# Patient Record
Sex: Male | Born: 1975 | Race: White | Hispanic: No | Marital: Married | Smoking: Never smoker
Health system: Southern US, Community
[De-identification: ages and names within clinical notes are randomized; demographics above are authoritative.]

## PROBLEM LIST (undated history)

## (undated) DIAGNOSIS — G43909 Migraine, unspecified, not intractable, without status migrainosus: Secondary | ICD-10-CM

## (undated) DIAGNOSIS — Z87442 Personal history of urinary calculi: Secondary | ICD-10-CM

## (undated) DIAGNOSIS — G4489 Other headache syndrome: Principal | ICD-10-CM

## (undated) DIAGNOSIS — E039 Hypothyroidism, unspecified: Secondary | ICD-10-CM

## (undated) DIAGNOSIS — G43009 Migraine without aura, not intractable, without status migrainosus: Secondary | ICD-10-CM

## (undated) DIAGNOSIS — M109 Gout, unspecified: Secondary | ICD-10-CM

## (undated) DIAGNOSIS — R0600 Dyspnea, unspecified: Secondary | ICD-10-CM

## (undated) DIAGNOSIS — Z8489 Family history of other specified conditions: Secondary | ICD-10-CM

## (undated) HISTORY — DX: Migraine, unspecified, not intractable, without status migrainosus: G43.909

## (undated) HISTORY — DX: Other headache syndrome: G44.89

## (undated) HISTORY — PX: FEMUR SURGERY: SHX943

## (undated) HISTORY — DX: Migraine without aura, not intractable, without status migrainosus: G43.009

## (undated) HISTORY — PX: SHOULDER SURGERY: SHX246

## (undated) HISTORY — PX: LEG SURGERY: SHX1003

---

## 1999-03-19 ENCOUNTER — Emergency Department (HOSPITAL_COMMUNITY): Admission: EM | Admit: 1999-03-19 | Discharge: 1999-03-19 | Payer: Self-pay | Admitting: Emergency Medicine

## 1999-03-19 ENCOUNTER — Encounter: Payer: Self-pay | Admitting: Emergency Medicine

## 1999-03-20 ENCOUNTER — Encounter: Payer: Self-pay | Admitting: Emergency Medicine

## 1999-05-02 ENCOUNTER — Emergency Department (HOSPITAL_COMMUNITY): Admission: EM | Admit: 1999-05-02 | Discharge: 1999-05-02 | Payer: Self-pay | Admitting: Emergency Medicine

## 1999-05-02 ENCOUNTER — Encounter: Payer: Self-pay | Admitting: Emergency Medicine

## 1999-06-19 ENCOUNTER — Encounter: Payer: Self-pay | Admitting: Family Medicine

## 1999-06-19 ENCOUNTER — Ambulatory Visit (HOSPITAL_COMMUNITY): Admission: RE | Admit: 1999-06-19 | Discharge: 1999-06-19 | Payer: Self-pay | Admitting: Family Medicine

## 2004-12-20 HISTORY — PX: LEG SURGERY: SHX1003

## 2005-05-14 ENCOUNTER — Ambulatory Visit (HOSPITAL_COMMUNITY): Admission: RE | Admit: 2005-05-14 | Discharge: 2005-05-14 | Payer: Self-pay | Admitting: Family Medicine

## 2005-05-14 ENCOUNTER — Emergency Department (HOSPITAL_COMMUNITY): Admission: EM | Admit: 2005-05-14 | Discharge: 2005-05-14 | Payer: Self-pay | Admitting: Family Medicine

## 2005-10-23 ENCOUNTER — Inpatient Hospital Stay (HOSPITAL_COMMUNITY): Admission: EM | Admit: 2005-10-23 | Discharge: 2005-10-28 | Payer: Self-pay | Admitting: Emergency Medicine

## 2006-03-14 ENCOUNTER — Inpatient Hospital Stay (HOSPITAL_COMMUNITY): Admission: RE | Admit: 2006-03-14 | Discharge: 2006-03-16 | Payer: Self-pay | Admitting: Orthopaedic Surgery

## 2009-02-16 ENCOUNTER — Emergency Department (HOSPITAL_COMMUNITY): Admission: EM | Admit: 2009-02-16 | Discharge: 2009-02-16 | Payer: Self-pay | Admitting: Emergency Medicine

## 2011-05-07 NOTE — Op Note (Signed)
NAME:  Thomas Burgess, Thomas Burgess NO.:  [id number]   MEDICAL RECORD NO.:  [id number]          PATIENT TYPE:  INP   LOCATION:  5038                         FACILITY:  [facility]   PHYSICIAN:  Mark C. Ophelia Charter, M.D.    DATE OF BIRTH:  1976/11/03   DATE OF PROCEDURE:  03/14/2006  DATE OF DISCHARGE:                                 OPERATIVE REPORT   PREOPERATIVE DIAGNOSIS:  Retained painful hardware left distal femur with  iliotibial band irritation and chondromalacia trochlear groove.   POSTOPERATIVE DIAGNOSIS:  Retained painful hardware left distal femur with  iliotibial band irritation and chondromalacia trochlear groove.   OPERATION PERFORMED:  Diagnostic and operative arthroscopy, left knee;  chondroplasty, trochlear groove.  Hardware removal.   SURGEON:  Mark C. Yates M.D.   ASSISTANT:   ANESTHESIA:  General.   DESCRIPTION OF PROCEDURE:  After induction of anesthesia, preoperative  Ancef, standard prep and drape with a lateral post and proximal tourniquet,  arthroscope was introduced after the usual sheets and drapes with Marcaine  infiltration of the skin with epinephrine.  Inflow was placed through a  superomedial portal.  Medial and lateral meniscus were normal.  ACL showed  some old evidence of avulsion off the femur with scar tissue in the notch  and PCL appeared normal.  Surprising there was a negative pivot shift test  and 1+ anterior drawer.  A 4.2 Cuda shaver was introduced and the area was  smoothed.  There was less than 1 mm of step off if that.  Probing showed  that there was some scar tissue present and the bone was solid with probe  not being able to enter along the probing of the fracture line.   Knee was thoroughly aspirated and suctioned dry.  Lateral incision was  opened distally.  Plate was identified and this was a titanium anatomic  distal femoral plate with one large central blue screw and the remaining  green screws which were DePuy anatomic plate.   The distal posterior screw  was stuck and had welded to the plate such that efforts to remove it were  unsuccessful.  Some of the other screws were extremely tight, difficult and  actually were beginning to strip until they finally broke loose. Great care  was taken to make sure that there was no stripping.  Once all screws had  been removed, a total of nine screws with one remaining, attempts at the  central insertion screw vise grips, large rongeur were all unsuccessful, it  was elected to proceed with osteotome underneath the plate for planning of  breaking the screw.  As it was pounded, the screw began to loosen slightly  from the bone and the plate was able to be grasped.  The incision which  initially had been one incision plus two small ones were then all connected  with a Z-shaped fashion.  Tensor fascia was completely slit and using the  plate as a handle the screw was spun out.  With all hardware removal,  irrigation, tourniquet deflation, closure of tensor fascia with number 1  Vicryl  interrupted suture, Hemovac drain was placed deep, 2-0 Vicryl in the  subcutaneous tissue, skin staple closure, nylon  in the skin, arthroscopic portals, postoperative dressing with Xeroform, 4 x  4s, ABD, Webril and Ace wrap.  Instrument count, needle count was correct.  The patient was then transferred to the recovery room in stable condition.      Mark C. Ophelia Charter, M.D.  Electronically Signed     MCY/MEDQ  D:  03/14/2006  T:  03/16/2006  Job:  [id number]
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
## 2012-01-20 ENCOUNTER — Emergency Department (HOSPITAL_COMMUNITY)
Admission: EM | Admit: 2012-01-20 | Discharge: 2012-01-20 | Disposition: A | Discharge status: Home / Self Care | Payer: Self-pay | Source: Home / Self Care | Attending: Emergency Medicine | Admitting: Emergency Medicine

## 2012-01-20 ENCOUNTER — Encounter (HOSPITAL_COMMUNITY): Payer: Self-pay | Admitting: *Deleted

## 2012-01-20 DIAGNOSIS — M109 Gout, unspecified: Secondary | ICD-10-CM

## 2012-01-20 HISTORY — DX: Hypothyroidism, unspecified: E03.9

## 2012-01-20 MED ORDER — HYDROCODONE-ACETAMINOPHEN 5-500 MG PO TABS: 1.0000 | ORAL_TABLET | Freq: Four times a day (QID) | ORAL | Status: AC | PRN

## 2012-01-20 MED ORDER — INDOMETHACIN 25 MG PO CAPS: 25.0000 mg | ORAL_CAPSULE | Freq: Three times a day (TID) | ORAL | Status: AC

## 2012-01-20 NOTE — ED Notes (Signed)
Pt is here with complaints of right foot pain with onset 1/28.

## 2012-01-21 NOTE — ED Provider Notes (Signed)
History     CSN: [id number]  Arrival date & time 01/20/12  8119   First MD Initiated Contact with Patient 01/20/12 2033      Chief Complaint  Patient presents with  . Foot Pain    (Consider location/radiation/quality/duration/timing/severity/associated sxs/prior treatment) HPI Comments: R foot been hurting for about 3 days the pian is just here ( see illustration), its hurting when i walk, its almost like a urning sensation" " No injuries and no traumas"  Patient is a 36 y.o. male presenting with lower extremity pain. The history is provided by the patient.  Foot Pain This is a new problem. The current episode started 2 days ago. The problem occurs constantly. The problem has been gradually worsening. The symptoms are aggravated by walking.    Past Medical History  Diagnosis Date  . Hypothyroid     Past Surgical History  Procedure Date  . Femur surgery     History reviewed. No pertinent family history.  History  Substance Use Topics  . Smoking status: Never Smoker   . Smokeless tobacco: Not on file  . Alcohol Use: No      Review of Systems  Constitutional: Negative for fever and diaphoresis.  Musculoskeletal: Positive for joint swelling. Negative for myalgias.    Allergies  Review of patient's allergies indicates no known allergies.  Home Medications   Current Outpatient Rx  Name Route Sig Dispense Refill  . FLUOXETINE HCL 10 MG PO CAPS Oral Take 10 mg by mouth daily.    Marland Kitchen LEVOTHYROXINE SODIUM 100 MCG PO TABS Oral Take 100 mcg by mouth daily.    Marland Kitchen HYDROCODONE-ACETAMINOPHEN 5-500 MG PO TABS Oral Take 1-2 tablets by mouth every 6 (six) hours as needed for pain. 15 tablet 0  . INDOMETHACIN 25 MG PO CAPS Oral Take 1 capsule (25 mg total) by mouth 3 (three) times daily with meals. 45 capsule 0    BP 151/88  Pulse 90  Temp(Src) 98.8 F (37.1 C) (Oral)  Resp 16  SpO2 99%  Physical Exam  Constitutional: He appears well-developed and well-nourished. He  appears distressed.  Musculoskeletal: He exhibits tenderness.       Right ankle: Achilles tendon normal.       Feet:  Skin: Skin is warm. No rash noted. No erythema.    ED Course  Procedures (including critical care time)  Labs Reviewed - No data to display No results found.   1. Podagra       MDM  R first MTP joint, pain suspected gout        Jimmie Molly, MD 01/21/12 [phone]

## 2013-03-25 ENCOUNTER — Encounter (HOSPITAL_COMMUNITY): Payer: Self-pay | Admitting: Emergency Medicine

## 2013-03-25 ENCOUNTER — Emergency Department (INDEPENDENT_AMBULATORY_CARE_PROVIDER_SITE_OTHER): Payer: 59

## 2013-03-25 ENCOUNTER — Emergency Department (HOSPITAL_COMMUNITY)
Admission: EM | Admit: 2013-03-25 | Discharge: 2013-03-25 | Disposition: A | Discharge status: Home / Self Care | Payer: 59 | Source: Home / Self Care | Attending: Emergency Medicine | Admitting: Emergency Medicine

## 2013-03-25 DIAGNOSIS — L309 Dermatitis, unspecified: Secondary | ICD-10-CM

## 2013-03-25 DIAGNOSIS — Z23 Encounter for immunization: Secondary | ICD-10-CM

## 2013-03-25 DIAGNOSIS — S62102S Fracture of unspecified carpal bone, left wrist, sequela: Secondary | ICD-10-CM

## 2013-03-25 DIAGNOSIS — S42309S Unspecified fracture of shaft of humerus, unspecified arm, sequela: Secondary | ICD-10-CM

## 2013-03-25 DIAGNOSIS — L259 Unspecified contact dermatitis, unspecified cause: Secondary | ICD-10-CM

## 2013-03-25 MED ORDER — HYDROCODONE-ACETAMINOPHEN 5-325 MG PO TABS: 2.0000 | ORAL_TABLET | ORAL | Status: DC | PRN

## 2013-03-25 MED ORDER — PREDNISONE 10 MG PO TABS: 20.0000 mg | ORAL_TABLET | Freq: Every day | ORAL | Status: DC

## 2013-03-25 MED ORDER — TETANUS-DIPHTH-ACELL PERTUSSIS 5-2.5-18.5 LF-MCG/0.5 IM SUSP
0.5000 mL | Freq: Once | INTRAMUSCULAR | Status: AC
  Administered 2013-03-25: 0.5 mL via INTRAMUSCULAR

## 2013-03-25 MED ORDER — TETANUS-DIPHTH-ACELL PERTUSSIS 5-2.5-18.5 LF-MCG/0.5 IM SUSP
INTRAMUSCULAR | Status: AC
  Filled 2013-03-25: qty 0.5

## 2013-03-25 NOTE — ED Notes (Signed)
Multiple complaints: Patient reports a fall yesterday 03/24/2013.  Patient slipped on deck steps ( 4 steps) and landed on concrete sidewalk.  Pain in lateral left wrist, scrapes to fingers.   Patient also concerned about rash to torso, thighs.  Patient thought to be poison ivy, itches badly.

## 2013-03-25 NOTE — ED Provider Notes (Signed)
History     CSN: [id number]  Arrival date & time 03/25/13  1120   First MD Initiated Contact with Patient 03/25/13 1136      No chief complaint on file.   (Consider location/radiation/quality/duration/timing/severity/associated sxs/prior treatment) Patient is a 37 y.o. male presenting with wrist pain. The history is provided by the patient. No language interpreter was used.  Wrist Pain This is a new problem. The current episode started yesterday. The problem occurs constantly. The problem has not changed since onset.Nothing relieves the symptoms.  Pt reports he slipped and fell yesterday.   Pt complains of pain in his wrist.   Past Medical History  Diagnosis Date  . Hypothyroid     Past Surgical History  Procedure Laterality Date  . Femur surgery      No family history on file.  History  Substance Use Topics  . Smoking status: Never Smoker   . Smokeless tobacco: Not on file  . Alcohol Use: No      Review of Systems  Musculoskeletal: Positive for myalgias and joint swelling.  Skin: Positive for rash.  All other systems reviewed and are negative.    Allergies  Review of patient's allergies indicates no known allergies.  Home Medications   Current Outpatient Rx  Name  Route  Sig  Dispense  Refill  . FLUoxetine (PROZAC) 10 MG capsule   Oral   Take 10 mg by mouth daily.         Marland Kitchen levothyroxine (SYNTHROID, LEVOTHROID) 100 MCG tablet   Oral   Take 100 mcg by mouth daily.           BP 114/73  Pulse 80  Temp(Src) 98.1 F (36.7 C) (Oral)  Resp 16  SpO2 100%  Physical Exam  Nursing note and vitals reviewed. Constitutional: He is oriented to person, place, and time. He appears well-developed.  Musculoskeletal: He exhibits tenderness.  Tender swollen left wrist abrasions knuckles  Neurological: He is alert and oriented to person, place, and time. He has normal reflexes.  Skin: Rash noted.  Psychiatric: He has a normal mood and affect.    ED Course   Procedures (including critical care time)  Labs Reviewed - No data to display No results found.   No diagnosis found.    MDM  Wrist splint,   Pt advised to see Dr. Janee Morn for recheck next week.        Lonia Skinner Wye, PA-C 03/25/13 1425

## 2013-03-25 NOTE — ED Provider Notes (Signed)
Medical screening examination/treatment/procedure(s) were performed by non-physician practitioner and as supervising physician I was immediately available for consultation/collaboration.  Quanna Wittke, M.D.  Cay Kath C Kendre Jacinto, MD 03/25/13 1541 

## 2013-03-25 NOTE — ED Notes (Signed)
Patient and family escorted to waiting room after x-ray to wait for results.

## 2014-06-04 ENCOUNTER — Ambulatory Visit (INDEPENDENT_AMBULATORY_CARE_PROVIDER_SITE_OTHER): Payer: Self-pay | Admitting: Family Medicine

## 2014-06-04 ENCOUNTER — Ambulatory Visit (INDEPENDENT_AMBULATORY_CARE_PROVIDER_SITE_OTHER): Payer: Commercial Managed Care - PPO | Admitting: Family Medicine

## 2014-06-04 ENCOUNTER — Encounter: Payer: Self-pay | Admitting: Family Medicine

## 2014-06-04 VITALS — BP 130/82 | HR 90 | Temp 97.6°F | Resp 16 | Ht 71.0 in | Wt 225.8 lb

## 2014-06-04 VITALS — BP 130/82 | HR 90 | Temp 97.9°F | Resp 16 | Ht 71.0 in | Wt 225.8 lb

## 2014-06-04 DIAGNOSIS — Z024 Encounter for examination for driving license: Secondary | ICD-10-CM

## 2014-06-04 DIAGNOSIS — Z0289 Encounter for other administrative examinations: Secondary | ICD-10-CM

## 2014-06-04 DIAGNOSIS — L03211 Cellulitis of face: Secondary | ICD-10-CM

## 2014-06-04 DIAGNOSIS — L0201 Cutaneous abscess of face: Secondary | ICD-10-CM

## 2014-06-04 MED ORDER — SULFAMETHOXAZOLE-TMP DS 800-160 MG PO TABS: 1.0000 | ORAL_TABLET | Freq: Two times a day (BID) | ORAL | Status: DC
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
## 2014-06-04 NOTE — Progress Notes (Signed)
Subjective: Patient has had a cyst ingrown hair type lesion on his left chin for about a week. He tried to open up a couple times he just got blood. It is now starting to calm down but he wanted it checked.  Objective: Cystic abscess about 1 cm in diameter on the left side of his chin. Has a little scab on it. Uncertain whether there is a hair then grew. It is not actively inflamed right now, seems to be starting to heal with a little skin starting to flake.  Assessment: Resolving abscess left shin  Plan: Bactrim twice daily for one week Because this looks like it is resolving I decided it was not necessary to I&D it.

## 2014-06-04 NOTE — Patient Instructions (Signed)
Take Bactrim one twice daily  If the abscess gets more acute please return at any time. Followup with your regular doctor as planned.  It is important that you followup on your thyroid as planned.

## 2014-12-23 ENCOUNTER — Other Ambulatory Visit: Payer: Self-pay | Admitting: Orthopaedic Surgery

## 2014-12-23 DIAGNOSIS — M25562 Pain in left knee: Secondary | ICD-10-CM

## 2014-12-29 ENCOUNTER — Ambulatory Visit
Admission: RE | Admit: 2014-12-29 | Discharge: 2014-12-29 | Disposition: A | Discharge status: Home / Self Care | Payer: Self-pay | Source: Ambulatory Visit | Attending: Orthopaedic Surgery | Admitting: Orthopaedic Surgery

## 2014-12-29 DIAGNOSIS — M25562 Pain in left knee: Secondary | ICD-10-CM

## 2014-12-30 ENCOUNTER — Other Ambulatory Visit: Payer: Self-pay

## 2017-04-01 ENCOUNTER — Ambulatory Visit (INDEPENDENT_AMBULATORY_CARE_PROVIDER_SITE_OTHER): Payer: Self-pay

## 2017-04-01 ENCOUNTER — Ambulatory Visit (INDEPENDENT_AMBULATORY_CARE_PROVIDER_SITE_OTHER): Payer: Commercial Managed Care - PPO | Admitting: Orthopaedic Surgery

## 2017-04-01 ENCOUNTER — Ambulatory Visit (INDEPENDENT_AMBULATORY_CARE_PROVIDER_SITE_OTHER): Payer: BLUE CROSS/BLUE SHIELD | Admitting: Orthopaedic Surgery

## 2017-04-01 DIAGNOSIS — G8929 Other chronic pain: Secondary | ICD-10-CM

## 2017-04-01 DIAGNOSIS — M25561 Pain in right knee: Secondary | ICD-10-CM | POA: Diagnosis not present

## 2017-04-01 DIAGNOSIS — M79672 Pain in left foot: Secondary | ICD-10-CM

## 2017-04-01 DIAGNOSIS — M25562 Pain in left knee: Secondary | ICD-10-CM | POA: Diagnosis not present

## 2017-04-01 MED ORDER — MELOXICAM 7.5 MG PO TABS
7.5000 mg | ORAL_TABLET | Freq: Two times a day (BID) | ORAL | 2 refills | Status: DC | PRN
Start: 2017-04-01 — End: 2017-09-19

## 2017-04-01 NOTE — Progress Notes (Signed)
Office Visit Note   Patient: Thomas Burgess           Date of Birth: 06-06-1976           MRN: [id number] Visit Date: 04/01/2017              Requested by: Kaleen Mask, MD [address] [place], [place] [address] PCP: Kaleen Mask, MD   Assessment & Plan: Visit Diagnoses:  1. Chronic pain of both knees   2. Pain in left foot     Plan: For the left knee he has moderate osteoarthritis. His x-rays show joint space narrowing already and he had MRI 2 years ago that showed chondromalacia. Monovisc application submitted for the left knee. For the right leg I think is overused it with his anterior compartment muscles. The herniation is postsurgical. Her left foot I recommend Achilles stretching and supportive shoes. I'll see him back for the Monovisc injection.  Follow-Up Instructions: Return if symptoms worsen or fail to improve.   Orders:  Orders Placed This Encounter  Procedures  . XR KNEE 3 VIEW LEFT  . XR KNEE 3 VIEW RIGHT  . XR Foot Complete Left   Meds ordered this encounter  Medications  . meloxicam (MOBIC) 7.5 MG tablet    Sig: Take 1 tablet (7.5 mg total) by mouth 2 (two) times daily as needed for pain.    Dispense:  30 tablet    Refill:  2      Procedures: No procedures performed   Clinical Data: No additional findings.   Subjective: Chief Complaint  Patient presents with  . Left Knee - Pain    Patient is a 41 year old gentleman I saw 2 years ago for left knee chondromalacia which we got an MRI for comes in with chronic throbbing aching pain of the left knee that's worse with activity. He is also complaining of some right anterior shin pain. He is status post ORIF right tibial plateau fracture. He is also complaining of some mid arch pain is worse with activity.    Review of Systems  Constitutional: Negative.   All other systems reviewed and are negative.    Objective: Vital Signs: There were no vitals taken for this  visit.  Physical Exam  Constitutional: He is oriented to person, place, and time. He appears well-developed and well-nourished.  Pulmonary/Chest: Effort normal.  Abdominal: Soft.  Neurological: He is alert and oriented to person, place, and time.  Skin: Skin is warm.  Psychiatric: He has a normal mood and affect. His behavior is normal. Judgment and thought content normal.  Nursing note and vitals reviewed.   Ortho Exam Left knee exam shows no joint effusion and excellent range of motion closed and cruciates are stable no medial or lateral joint line tenderness. Mild patellar crepitus Right knee exam shows well-healed surgical scar. There is a muscular herniation of the anterior compartment likely due to the surgical approach. Left foot exam shows no swelling or skin changes he has discomfort in the mid arch of the plantar fascia. Specialty Comments:  No specialty comments available.  Imaging: No results found.   PMFS History: There are no active problems to display for this patient.  Past Medical History:  Diagnosis Date  . Hypothyroid     No family history on file.  Past Surgical History:  Procedure Laterality Date  . FEMUR SURGERY     Social History   Occupational History  . Not on file.   Social  History Main Topics  . Smoking status: Never Smoker  . Smokeless tobacco: Not on file  . Alcohol use No  . Drug use: No  . Sexual activity: Not on file

## 2017-04-13 ENCOUNTER — Telehealth (INDEPENDENT_AMBULATORY_CARE_PROVIDER_SITE_OTHER): Payer: Self-pay | Admitting: Orthopaedic Surgery

## 2017-04-13 NOTE — Telephone Encounter (Signed)
Candy called from [employer] and was needing a pre authorization for the patient to receive the injection. CB # [phone]

## 2017-04-14 NOTE — Telephone Encounter (Signed)
Called no answer LMOM to return my call

## 2017-04-19 ENCOUNTER — Telehealth (INDEPENDENT_AMBULATORY_CARE_PROVIDER_SITE_OTHER): Payer: Self-pay

## 2017-05-31 ENCOUNTER — Telehealth (INDEPENDENT_AMBULATORY_CARE_PROVIDER_SITE_OTHER): Payer: Self-pay

## 2017-05-31 NOTE — Telephone Encounter (Signed)
SYNVISC INJECTION WAS APPROVED BY [employer].   CALLED PT TO [facility] AND HE SAID HE WILL CALL US BACK TO SCHEDULE APPT SINCE HE WILL BE GOING ON VACATION NEXT WEEK, INJ IS IN MY CABINET.

## 2017-09-19 ENCOUNTER — Other Ambulatory Visit: Payer: Self-pay | Admitting: Family Medicine

## 2017-09-19 ENCOUNTER — Encounter (INDEPENDENT_AMBULATORY_CARE_PROVIDER_SITE_OTHER): Payer: Self-pay | Admitting: Orthopaedic Surgery

## 2017-09-19 ENCOUNTER — Ambulatory Visit (INDEPENDENT_AMBULATORY_CARE_PROVIDER_SITE_OTHER): Payer: BLUE CROSS/BLUE SHIELD | Admitting: Orthopaedic Surgery

## 2017-09-19 DIAGNOSIS — R519 Headache, unspecified: Secondary | ICD-10-CM

## 2017-09-19 DIAGNOSIS — R51 Headache: Principal | ICD-10-CM

## 2017-09-19 DIAGNOSIS — M1712 Unilateral primary osteoarthritis, left knee: Secondary | ICD-10-CM | POA: Diagnosis not present

## 2017-09-19 MED ORDER — MELOXICAM 7.5 MG PO TABS: 7.5000 mg | ORAL_TABLET | Freq: Two times a day (BID) | ORAL | 6 refills | Status: DC | PRN

## 2017-09-19 MED ORDER — HYLAN G-F 20 48 MG/6ML IX SOSY
48.0000 mg | PREFILLED_SYRINGE | INTRA_ARTICULAR | Status: AC | PRN
  Administered 2017-09-19: 48 mg via INTRA_ARTICULAR

## 2017-09-19 NOTE — Progress Notes (Signed)
   Procedure Note  Patient: Thomas Burgess             Date of Birth: 1976/03/17           MRN: [id number]             Visit Date: 09/19/2017  Procedures: Visit Diagnoses: Unilateral primary osteoarthritis, left knee  Large Joint Inj Date/Time: 09/19/2017 9:21 AM Performed by: Tarry Kos Authorized by: Tarry Kos   Consent Given by:  Patient Timeout: prior to procedure the correct patient, procedure, and site was verified   Indications:  Pain Location:  Knee Site:  L knee Prep: patient was prepped and draped in usual sterile fashion   Needle Size:  22 G Approach:  Anterolateral Ultrasound Guidance: No   Fluoroscopic Guidance: No   Arthrogram: No   Medications:  48 mg Hylan 48 MG/6ML

## 2017-09-25 ENCOUNTER — Ambulatory Visit
Admission: RE | Admit: 2017-09-25 | Discharge: 2017-09-25 | Disposition: A | Discharge status: Home / Self Care | Payer: BLUE CROSS/BLUE SHIELD | Source: Ambulatory Visit | Attending: Family Medicine | Admitting: Family Medicine

## 2017-09-25 DIAGNOSIS — R51 Headache: Principal | ICD-10-CM

## 2017-09-25 DIAGNOSIS — R519 Headache, unspecified: Secondary | ICD-10-CM

## 2017-09-28 ENCOUNTER — Encounter: Payer: Self-pay | Admitting: Neurology

## 2017-09-28 ENCOUNTER — Ambulatory Visit (INDEPENDENT_AMBULATORY_CARE_PROVIDER_SITE_OTHER): Payer: BLUE CROSS/BLUE SHIELD | Admitting: Neurology

## 2017-09-28 DIAGNOSIS — G4489 Other headache syndrome: Secondary | ICD-10-CM | POA: Diagnosis not present

## 2017-09-28 DIAGNOSIS — G43909 Migraine, unspecified, not intractable, without status migrainosus: Secondary | ICD-10-CM | POA: Insufficient documentation

## 2017-09-28 DIAGNOSIS — G43009 Migraine without aura, not intractable, without status migrainosus: Secondary | ICD-10-CM | POA: Diagnosis not present

## 2017-09-28 HISTORY — DX: Migraine without aura, not intractable, without status migrainosus: G43.009

## 2017-09-28 HISTORY — DX: Other headache syndrome: G44.89

## 2017-09-28 MED ORDER — PREDNISONE 10 MG PO TABS: ORAL_TABLET | ORAL | 0 refills | Status: DC

## 2017-09-28 MED ORDER — TOPIRAMATE 25 MG PO TABS: ORAL_TABLET | ORAL | 3 refills | Status: DC

## 2017-09-28 NOTE — Progress Notes (Signed)
Reason for visit: Headache  Referring physician: Dr. Latanya Maudlin is a 41 y.o. male  History of present illness:  Mr. Maack is a 41 year old right-handed white male with a history of migraine headaches that have been present since he was 89 or 41 years old. His usual migraine are in the frontotemporal regions associated with photophobia and phonophobia, and with nausea without vomiting. The patient is having one headache such as this every 2 months or so, the headaches are severe and are helped by trying to get to sleep. The patient has developed another type of headache that began about one month ago. The patient indicates a spontaneous onset of right occipital pain that is fairly well localized. It has been associated with some cognitive clouding, no photophobia or phonophobia or nausea has been noted. He also feels he is slightly off balance, and somewhat dizzy. He has not had any falls. He denies any pain down into the shoulders, he denies any significant neck stiffness or neck pain. He denies any issues controlling the bowels or the bladder. He has not had any numbness or weakness of the arms or legs or face. He has undergone MRI evaluation of the brain that was normal. He is sent to this office for an evaluation. His wife has migraine, he has taken Maxalt for his headache without benefit. The headache is present every day, all day long.  Past Medical History:  Diagnosis Date  . Hypothyroid   . Migraine     Past Surgical History:  Procedure Laterality Date  . FEMUR SURGERY    . 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 LEG SURGERY     Removed hardware    Family History  Problem Relation Age of Onset  . Congestive Heart Failure Mother   . Diabetes Mother   . Kidney failure Father   . Brain cancer Cousin     Social history:  reports that he has never smoked. He has never used smokeless tobacco. He reports that he does not drink alcohol or use  drugs.  Medications:  Prior to Admission medications   Medication Sig Start Date End Date Taking? Authorizing Provider  levothyroxine (SYNTHROID, LEVOTHROID) 100 MCG tablet Take 100 mcg by mouth daily.   Yes [provider]  meloxicam (MOBIC) 7.5 MG tablet Take 1 tablet (7.5 mg total) by mouth 2 (two) times daily as needed for pain. 09/19/17  Yes Tarry Kos, MD  sulfamethoxazole-trimethoprim (BACTRIM DS) 800-160 MG per tablet Take 1 tablet by mouth 2 (two) times daily. 06/04/14  Yes Peyton Najjar, MD     No Known Allergies  ROS:  Out of a complete 14 system review of symptoms, the patient complains only of the following symptoms, and all other reviewed systems are negative.  Fatigue Blurred vision Joint pain Memory loss, confusion, headache, dizziness Depression, decreased energy, disinterest in activities  Blood pressure 138/88, pulse 65, height  (1.88 m), weight 227 lb (103 kg), SpO2 97 %.  Physical Exam  General: The patient is alert and cooperative at the time of the examination.  Eyes: Pupils are equal, round, and reactive to light. Discs are flat bilaterally.  Neck: The neck is supple, no carotid bruits are noted.  Respiratory: The respiratory examination is clear.  Cardiovascular: The cardiovascular examination reveals a regular rate and rhythm, no obvious murmurs or rubs are noted.  Neuromuscular: Range of movement of the cervical spine is full.  Skin: Extremities are without significant edema.  Neurologic Exam  Mental status: The patient is alert and oriented x 3 at the time of the examination. The patient has apparent normal recent and remote memory, with an apparently normal attention span and concentration ability.  Cranial nerves: Facial symmetry is present. There is good sensation of the face to pinprick and soft touch bilaterally. The strength of the facial muscles and the muscles to head turning and shoulder shrug are normal bilaterally.  Speech is well enunciated, no aphasia or dysarthria is noted. Extraocular movements are full. Visual fields are full. The tongue is midline, and the patient has symmetric elevation of the soft palate. No obvious hearing deficits are noted.  Motor: The motor testing reveals 5 over 5 strength of all 4 extremities. Good symmetric motor tone is noted throughout.  Sensory: Sensory testing is intact to pinprick, soft touch, vibration sensation, and position sense on the left extremities. The patient has some mild impairment of pinprick, vibration sensation, and position sensation on the right arm and right leg. No evidence of extinction is noted.  Coordination: Cerebellar testing reveals good finger-nose-finger and heel-to-shin bilaterally.  Gait and station: Gait is normal. Tandem gait is normal. Romberg is negative. No drift is seen.  Reflexes: Deep tendon reflexes are symmetric and normal bilaterally. Toes are downgoing bilaterally.   MRI brain 09/25/17:  IMPRESSION: Normal examination. No abnormality seen to explain the presenting Symptoms.  * MRI scan images were reviewed online. I agree with the written report.    Assessment/Plan:  1. History of common migraine headache  2. Right occipital headache, new onset  The patient has had a new type of headache that does not feel like his typical migraine. The headache is in the right occipital area, well localized, and appears to be associated with some mild cognitive changes and gait instability. MRI of the brain appears to be normal. The patient will be placed on a prednisone Dosepak, 10 mg 12 day pack. He will be started on Topamax. If the headache does not abate in the next couple weeks, he will contact our office, we may consider an occipital nerve injection on the right. If the headache continues, MRI of the cervical spine will be done. The patient will follow-up in 2-3 months otherwise.  Marlan Palau MD 09/28/2017 9:07 AM  [facility] [address] [place], [place] [address]  Phone [phone] Fax [phone]

## 2017-09-28 NOTE — Patient Instructions (Signed)
   We will initiate a Prednisone taper, and start Topamax for the headache.  If the headache does not get better, call and I will get MRI of the neck.   Topamax (topiramate) is a seizure medication that has an FDA approval for seizures and for migraine headache. Potential side effects of this medication include weight loss, cognitive slowing, tingling in the fingers and toes, and carbonated drinks will taste bad. If any significant side effects are noted on this drug, please contact our office.

## 2017-10-01 ENCOUNTER — Other Ambulatory Visit: Payer: Self-pay

## 2017-10-12 ENCOUNTER — Telehealth: Payer: Self-pay | Admitting: Neurology

## 2017-10-12 NOTE — Telephone Encounter (Signed)
Called pt. LVM. Offered today at 230pm, check in 200pm. Asked him to call back and let me know if this works. Gave [facility] phone number.

## 2017-10-12 NOTE — Telephone Encounter (Signed)
Note made in error

## 2017-10-12 NOTE — Telephone Encounter (Signed)
Called pt again. LVM letting him know 230pm no longer available. Asked him to call back to schedule appt.

## 2017-10-12 NOTE — Telephone Encounter (Signed)
This is a continuation of previous entry, the following dates were offered to pt: 10-26 @12 , today @ 2:30,10-30@7 :30, and 10-31@12 .  Pt was unable to accept any of these dates and times offered by RN Kara MeadEmma.

## 2017-10-12 NOTE — Telephone Encounter (Signed)
Pt called back, said he could as off work in a couple of weeks. 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 Please call to advise.

## 2017-10-12 NOTE — Telephone Encounter (Signed)
Pt returned the call to RN Kara MeadEmma, RN Kara MeadEmma offered several dates and times to pt who unfortunately could not accept any of them due to a conflict with his work schedule.  Pt asked that it be documented that he is still having head aches and that he will try back should he see potential times that he will be available with his work schedule.  He is thankful for RN Kara Meadmma attempting to schedule him and he apologized for missing her call.

## 2017-10-12 NOTE — Telephone Encounter (Addendum)
230 pm no longer available. When he calls, we will have to offer a different work in slot, [address]. Can offer Friday (10/14/17) at 12pm, check in 1130am

## 2017-10-12 NOTE — Telephone Encounter (Signed)
I called the patient. His headache is still going on, we will have him come in for an occipital nerve injection to see this helps.

## 2017-10-12 NOTE — Telephone Encounter (Signed)
Patient's wife calling to schedule a soon appointment. His headache has not stopped since he was saw Dr. Anne HahnWillis on 09-28-17. He is taking topiramate (TOPAMAX) 25 MG tablet and it has not helped.

## 2017-10-13 NOTE — Telephone Encounter (Signed)
Patient called and stated that he has [facility] additional [facility]. Please call and advise.

## 2017-10-13 NOTE — Telephone Encounter (Signed)
Called pt back. He stated 10/31/17 did not work d/t work schedule. He requested appt on 11/26. Made appt that day at 0730 per pt request. He verbalized understanding and appreciation.

## 2017-10-13 NOTE — Telephone Encounter (Signed)
Called pt. Offered work in appt for 10/28/17 at 12pm. He declined, stating he needs a Monday appt.  Scheduled pt for 10/31/17 at 730am, check in latest 715am. He verbalized understanding and appreciation for call.

## 2017-10-31 ENCOUNTER — Ambulatory Visit: Payer: Self-pay | Admitting: Neurology

## 2017-11-14 ENCOUNTER — Encounter: Payer: Self-pay | Admitting: Neurology

## 2017-11-14 ENCOUNTER — Other Ambulatory Visit: Payer: Self-pay | Admitting: *Deleted

## 2017-11-14 ENCOUNTER — Telehealth: Payer: Self-pay | Admitting: *Deleted

## 2017-11-14 ENCOUNTER — Telehealth: Payer: Self-pay

## 2017-11-14 ENCOUNTER — Ambulatory Visit (INDEPENDENT_AMBULATORY_CARE_PROVIDER_SITE_OTHER): Payer: BLUE CROSS/BLUE SHIELD | Admitting: Neurology

## 2017-11-14 VITALS — BP 146/97 | HR 91 | Ht 74.0 in | Wt 226.0 lb

## 2017-11-14 DIAGNOSIS — G43009 Migraine without aura, not intractable, without status migrainosus: Secondary | ICD-10-CM | POA: Diagnosis not present

## 2017-11-14 DIAGNOSIS — G4489 Other headache syndrome: Secondary | ICD-10-CM

## 2017-11-14 MED ORDER — TOPIRAMATE 100 MG PO TABS: 100.0000 mg | ORAL_TABLET | Freq: Two times a day (BID) | ORAL | 1 refills | Status: DC

## 2017-11-14 MED ORDER — TOPIRAMATE 100 MG PO TABS: 100.0000 mg | ORAL_TABLET | Freq: Every day | ORAL | 1 refills | Status: DC

## 2017-11-14 MED ORDER — RIZATRIPTAN BENZOATE 10 MG PO TABS: 10.0000 mg | ORAL_TABLET | Freq: Three times a day (TID) | ORAL | 5 refills | Status: DC | PRN

## 2017-11-14 NOTE — Patient Instructions (Signed)
   We will go up on the Topamax to 100 mg at night. Use the Maxalt 10 mg tablet to take if needed, do not take more than 3 Maxalt tablets in a 24 hour period.  Topamax (topiramate) is a seizure medication that has an FDA approval for seizures and for migraine headache. Potential side effects of this medication include weight loss, cognitive slowing, tingling in the fingers and toes, and carbonated drinks will taste bad. If any significant side effects are noted on this drug, please contact our office.

## 2017-11-14 NOTE — Telephone Encounter (Signed)
Called [facility] and cancelled original rx topamax for 100mg  tablet 1 tab twice daily qty90, 1 refill. This was an error. They cx rx as requested.   E-scribed topamax 100mg  tablet (1 tablet at bedtime) per CW,MD. Qty 90, 1 refill instead.

## 2017-11-14 NOTE — Telephone Encounter (Signed)
I received a prior auth request for Topamax. I have completed and submitted the PA and should have a determination within 48-72 hours.  Cover my meds Key: [id number]

## 2017-11-14 NOTE — Progress Notes (Signed)
Reason for visit: Headache  Thomas Burgess is an 41 y.o. male  History of present illness:  Thomas Burgess is a 41 year old right-handed white male with a history of headaches.  The patient has recently developed a right occipital headache that is different from his usual.  Headache may be associated with some cognitive clouding, sleepiness.  He denies any photophobia or phonophobia with the headache.  The patient was originally given a prednisone Dosepak and placed on Topamax, he has built up on the dose and appears to be effective in helping to reduce the frequency of the headache.  He is now having headaches once a week, the headache may last all day long, he is still able to work.  The patient denies any neck stiffness.  The patient is tolerating the Topamax well, he does note that carbonated drinks taste bad.  The patient returns to this office for an evaluation.  Past Medical History:  Diagnosis Date  . Common migraine 09/28/2017  . Headache syndrome 09/28/2017   Right occipital  . Hypothyroid   . Migraine     Past Surgical History:  Procedure Laterality Date  . FEMUR SURGERY    . 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 LEG SURGERY     Removed hardware    Family History  Problem Relation Age of Onset  . Congestive Heart Failure Mother   . Diabetes Mother   . Kidney failure Father   . Brain cancer Cousin     Social history:  reports that  has never smoked. he has never used smokeless tobacco. He reports that he does not drink alcohol or use drugs.   No Known Allergies  Medications:  Prior to Admission medications   Medication Sig Start Date End Date Taking? Authorizing Provider  levothyroxine (SYNTHROID, LEVOTHROID) 100 MCG tablet Take 100 mcg by mouth daily.   Yes [provider]  meloxicam (MOBIC) 7.5 MG tablet Take 1 tablet (7.5 mg total) by mouth 2 (two) times daily as needed for pain. 09/19/17  Yes Tarry KosXu, Naiping M, MD  sulfamethoxazole-trimethoprim  (BACTRIM DS) 800-160 MG per tablet Take 1 tablet by mouth 2 (two) times daily. 06/04/14  Yes Peyton NajjarHopper, David H, MD  topiramate (TOPAMAX) 25 MG tablet Take one tablet at night for one week, then take 2 tablets at night for one week, then take 3 tablets at night. 09/28/17  Yes York SpanielWillis, Philopateer Strine K, MD    ROS:  Out of a complete 14 system review of symptoms, the patient complains only of the following symptoms, and all other reviewed systems are negative.  Headache  Blood pressure (!) 146/97, pulse 91, height 6\' 2"  (1.88 m), weight 226 lb (102.5 kg).  Physical Exam  General: The patient is alert and cooperative at the time of the examination.  Neuromuscular: Range of movement the cervical spine is full.  Skin: No significant peripheral edema is noted.   Neurologic Exam  Mental status: The patient is alert and oriented x 3 at the time of the examination. The patient has apparent normal recent and remote memory, with an apparently normal attention span and concentration ability.   Cranial nerves: Facial symmetry is present. Speech is normal, no aphasia or dysarthria is noted. Extraocular movements are full. Visual fields are full.  Motor: The patient has good strength in all 4 extremities.  Sensory examination: Soft touch sensation is symmetric on the face, arms, and legs.  Coordination: The patient has good finger-nose-finger  and heel-to-shin bilaterally.  Gait and station: The patient has a normal gait. Tandem gait is normal. Romberg is negative. No drift is seen.  Reflexes: Deep tendon reflexes are symmetric.   Assessment/Plan:  1.  Right occipital headache, probable migraine  The patient is responding well to the Topamax, we will go up to 100 mg at night.  The patient was given Maxalt to take if needed.  The patient recently was worked in for an occipital nerve injection, but this was not done today as the headache has improved.  The patient will follow-up for his next scheduled  appointment on 24 January 2018.  Marlan Palau. Keith Alexavier Tsutsui MD 11/14/2017 7:26 AM  [facility] [address] [place], [place] [id number]  Phone [phone] Fax [phone]

## 2018-01-24 ENCOUNTER — Ambulatory Visit: Payer: BLUE CROSS/BLUE SHIELD | Admitting: Adult Health

## 2018-02-12 ENCOUNTER — Encounter (HOSPITAL_COMMUNITY): Payer: Self-pay | Admitting: Emergency Medicine

## 2018-02-12 ENCOUNTER — Other Ambulatory Visit: Payer: Self-pay

## 2018-02-12 ENCOUNTER — Ambulatory Visit (HOSPITAL_COMMUNITY)
Admission: EM | Admit: 2018-02-12 | Discharge: 2018-02-12 | Disposition: A | Discharge status: Home / Self Care | Payer: BLUE CROSS/BLUE SHIELD | Attending: Family Medicine | Admitting: Family Medicine

## 2018-02-12 DIAGNOSIS — M25522 Pain in left elbow: Secondary | ICD-10-CM

## 2018-02-12 HISTORY — DX: Gout, unspecified: M10.9

## 2018-02-12 MED ORDER — NAPROXEN 500 MG PO TABS: 500.0000 mg | ORAL_TABLET | Freq: Two times a day (BID) | ORAL | 0 refills | Status: DC

## 2018-02-12 NOTE — Discharge Instructions (Signed)
Ice and elevation Naproxen twice a day for pain. Protect elbow from direct trauma or injury to allow for healing. If no improvement, fevers develop, worsening of pain, numbness or tingling to arm or hand or otherwise worsening please return to be seen or follow up with your orthopedist.

## 2018-02-12 NOTE — ED Provider Notes (Signed)
[facility]    CSN: [id number] Arrival date & time: 02/12/18  1204     History   Chief Complaint Chief Complaint  Patient presents with  . Elbow Pain    left    HPI Thomas Burgess is a 42 y.o. male.   Thomas Burgess presents with s/o with complaints of left elbow pain which he woke with yesterday morning. Without injury. He is active with his work and does lifting and pulling, but without any specific over use or injury. Yesterday was stiff and more painful, today pain has improved. Pain 3/10. He is right handed. States he has had gout in the past but this does not feel similar. Pain is primarily if olecranon is touched or with rotation of the elbow. Without fevers, numbness or tingling. Has not taken any medications for his pain. History of hypothyroid, migraines, gout.     ROS per HPI.       Past Medical History:  Diagnosis Date  . Common migraine 09/28/2017  . Gout   . Headache syndrome 09/28/2017   Right occipital  . Hypothyroid   . Migraine     Patient Active Problem List   Diagnosis Date Noted  . Headache syndrome 09/28/2017  . Common migraine 09/28/2017  . Unilateral primary osteoarthritis, left knee 09/19/2017    Past Surgical History:  Procedure Laterality Date  . FEMUR SURGERY    . 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 LEG SURGERY     Removed hardware       Home Medications    Prior to Admission medications   Medication Sig Start Date End Date Taking? Authorizing Provider  levothyroxine (SYNTHROID, LEVOTHROID) 100 MCG tablet Take 100 mcg by mouth daily.   Yes [provider]  rizatriptan (MAXALT) 10 MG tablet Take 1 tablet (10 mg total) by mouth 3 (three) times daily as needed for migraine. May repeat in 2 hours if needed 11/14/17  Yes York Spaniel, MD  topiramate (TOPAMAX) 100 MG tablet Take 1 tablet (100 mg total) by mouth at bedtime. 11/14/17  Yes York Spaniel, MD  meloxicam (MOBIC) 7.5 MG tablet Take 1  tablet (7.5 mg total) by mouth 2 (two) times daily as needed for pain. 09/19/17   Tarry Kos, MD  naproxen (NAPROSYN) 500 MG tablet Take 1 tablet (500 mg total) by mouth 2 (two) times daily. 02/12/18   Georgetta Haber, NP    Family History Family History  Problem Relation Age of Onset  . Congestive Heart Failure Mother   . Diabetes Mother   . Kidney failure Father   . Brain cancer Cousin     Social History Social History   Tobacco Use  . Smoking status: Never Smoker  . Smokeless tobacco: Never Used  Substance Use Topics  . Alcohol use: No  . Drug use: No     Allergies   Patient has no known allergies.   Review of Systems Review of Systems   Physical Exam Triage Vital Signs ED Triage Vitals [02/12/18 1229]  Enc Vitals Group     BP (!) 145/91     Pulse Rate 99     Resp      Temp 99.1 F (37.3 C)     Temp Source Oral     SpO2 96 %     Weight      Height      Head Circumference      Peak Flow  Pain Score 3     Pain Loc      Pain Edu?      Excl. in GC?    No data found.  Updated Vital Signs BP (!) 145/91 (BP Location: Right Arm)   Pulse 99   Temp 99.1 F (37.3 C) (Oral)   SpO2 96%   Visual Acuity Right Eye Distance:   Left Eye Distance:   Bilateral Distance:    Right Eye Near:   Left Eye Near:    Bilateral Near:     Physical Exam  Constitutional: He is oriented to person, place, and time. He appears well-developed and well-nourished.  Cardiovascular: Normal rate and regular rhythm.  Pulmonary/Chest: Effort normal and breath sounds normal.  Musculoskeletal:       Left elbow: He exhibits swelling. He exhibits normal range of motion, no effusion, no deformity and no laceration. Tenderness found. Olecranon process tenderness noted.  Tenderness only over olecranon process with minimal swelling noted; without redness, warmth; mild subjective pain with supination and pronation but with active full ROM; without pain with flexion/extension; full  wrist and shoulder ROM; sensation intact, strong radial pulse  Neurological: He is alert and oriented to person, place, and time.  Skin: Skin is warm and dry.     UC Treatments / Results  Labs (all labs ordered are listed, but only abnormal results are displayed) Labs Reviewed - No data to display  EKG  EKG Interpretation None       Radiology No results found.  Procedures Procedures (including critical care time)  Medications Ordered in UC Medications - No data to display   Initial Impression / Assessment and Plan / UC Course  I have reviewed the triage vital signs and the nursing notes.  Pertinent labs & imaging results that were available during my care of the patient were reviewed by me and considered in my medical decision making (see chart for details).     Question early olecranon bursiitis. Reassuring that pain has improved today already. Encouraged continued use of Ice, NSAIDS, joint protection. Follow up with ortho and/or PCP as needed. Return precautions provided. Patient verbalized understanding and agreeable to plan.    Final Clinical Impressions(s) / UC Diagnoses   Final diagnoses:  Left elbow pain    ED Discharge Orders        Ordered    naproxen (NAPROSYN) 500 MG tablet  2 times daily     02/12/18 1247       Controlled Substance Prescriptions [place] Controlled Substance Registry consulted? Not Applicable   Georgetta HaberBurky,  B, NP 02/12/18 1258

## 2018-02-12 NOTE — ED Triage Notes (Signed)
Pt states he woke up yesterday with pain to the tip of his elbow and swelling around the elbow.  He took tylenol yesterday with little relief.  He has been using ice for relief.

## 2018-03-13 ENCOUNTER — Ambulatory Visit: Payer: BLUE CROSS/BLUE SHIELD | Admitting: Adult Health

## 2018-03-27 ENCOUNTER — Encounter (INDEPENDENT_AMBULATORY_CARE_PROVIDER_SITE_OTHER): Payer: Self-pay

## 2018-03-27 ENCOUNTER — Telehealth: Payer: Self-pay | Admitting: *Deleted

## 2018-03-27 ENCOUNTER — Ambulatory Visit (INDEPENDENT_AMBULATORY_CARE_PROVIDER_SITE_OTHER): Payer: BLUE CROSS/BLUE SHIELD | Admitting: Adult Health

## 2018-03-27 ENCOUNTER — Encounter: Payer: Self-pay | Admitting: Adult Health

## 2018-03-27 VITALS — BP 142/82 | HR 72 | Ht 74.0 in | Wt 227.0 lb

## 2018-03-27 DIAGNOSIS — G4489 Other headache syndrome: Secondary | ICD-10-CM | POA: Diagnosis not present

## 2018-03-27 MED ORDER — TOPIRAMATE 100 MG PO TABS: 100.0000 mg | ORAL_TABLET | Freq: Every day | ORAL | 3 refills | Status: DC

## 2018-03-27 NOTE — Progress Notes (Signed)
PATIENT: Thomas Burgess DOB: Jul 25, 1976  REASON FOR VISIT: follow up HISTORY FROM: patient  HISTORY OF PRESENT ILLNESS: Today 03/27/18 Thomas Burgess is a 42 year old male with a history of headaches and possible occipital neuralgia.  He returns today for follow-up.  He reports that his discomfort has been manageable with Topamax.  He states that he has 1-2 headaches a month.  Reports that the headaches always occur in the right occipital region.  He states that he can point to the exact spot.  He describes it as a sharp shooting pain.  He states that they typically last 24 hours despite taking Maxalt.  He denies photophobia, phonophobia, nausea and vomiting.  He feels that this is manageable at this time.  He does not want to increase Topamax.  HISTORY 11/14/17 (WILLIS): Thomas Burgess is a 42 year old right-handed white male with a history of headaches.  The patient has recently developed a right occipital headache that is different from his usual.  Headache may be associated with some cognitive clouding, sleepiness.  He denies any photophobia or phonophobia with the headache.  The patient was originally given a prednisone Dosepak and placed on Topamax, he has built up on the dose and appears to be effective in helping to reduce the frequency of the headache.  He is now having headaches once a week, the headache may last all day long, he is still able to work.  The patient denies any neck stiffness.  The patient is tolerating the Topamax well, he does note that carbonated drinks taste bad.  The patient returns to this office for an evaluation.   REVIEW OF SYSTEMS: Out of a complete 14 system review of symptoms, the patient complains only of the following symptoms, and all other reviewed systems are negative. See HPI  ALLERGIES: No Known Allergies  HOME MEDICATIONS: Outpatient Medications Prior to Visit  Medication Sig Dispense Refill  . levothyroxine (SYNTHROID, LEVOTHROID) 112 MCG tablet Take  112 mcg by mouth daily before breakfast.    . meloxicam (MOBIC) 7.5 MG tablet Take 1 tablet (7.5 mg total) by mouth 2 (two) times daily as needed for pain. 30 tablet 6  . rizatriptan (MAXALT) 10 MG tablet Take 1 tablet (10 mg total) by mouth 3 (three) times daily as needed for migraine. May repeat in 2 hours if needed 10 tablet 5  . topiramate (TOPAMAX) 100 MG tablet Take 1 tablet (100 mg total) by mouth at bedtime. 90 tablet 1  . naproxen (NAPROSYN) 500 MG tablet Take 1 tablet (500 mg total) by mouth 2 (two) times daily. 30 tablet 0  . levothyroxine (SYNTHROID, LEVOTHROID) 100 MCG tablet Take 100 mcg by mouth daily.     No facility-administered medications prior to visit.     PAST MEDICAL HISTORY: Past Medical History:  Diagnosis Date  . Common migraine 09/28/2017  . Gout   . Headache syndrome 09/28/2017   Right occipital  . Hypothyroid   . Migraine     PAST SURGICAL HISTORY: Past Surgical History:  Procedure Laterality Date  . FEMUR SURGERY    . 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 LEG SURGERY     Removed hardware    FAMILY HISTORY: Family History  Problem Relation Age of Onset  . Congestive Heart Failure Mother   . Diabetes Mother   . Kidney failure Father   . Brain cancer Cousin     SOCIAL HISTORY: Social History   Socioeconomic History  .  Marital status: Married    Spouse name: Not on file  . Number of children: 1  . Years of education: 57  . Highest education level: Not on file  Occupational History  . Occupation: [employer]  . Financial resource strain: Not on file  . Food insecurity:    Worry: Not on file    Inability: Not on file  . Transportation needs:    Medical: Not on file    Non-medical: Not on file  Tobacco Use  . Smoking status: Never Smoker  . Smokeless tobacco: Never Used  Substance and Sexual Activity  . Alcohol use: No  . Drug use: No  . Sexual activity: Not on file  Lifestyle  . Physical activity:     Days per week: Not on file    Minutes per session: Not on file  . Stress: Not on file  Relationships  . Social connections:    Talks on phone: Not on file    Gets together: Not on file    Attends religious service: Not on file    Active member of club or organization: Not on file    Attends meetings of clubs or organizations: Not on file    Relationship status: Not on file  . Intimate partner violence:    Fear of current or ex partner: Not on file    Emotionally abused: Not on file    Physically abused: Not on file    Forced sexual activity: Not on file  Other Topics Concern  . Not on file  Social History Narrative   Lives with wife, daughter   Caffeine use: soft drinks daily      Right handed       PHYSICAL EXAM  Vitals:   03/27/18 0715  BP: (!) 142/82  Pulse: 72  Weight: 227 lb (103 kg)  Height: 6\' 2"  (1.88 m)   Body mass index is 29.15 kg/m.  Generalized: Well developed, in no acute distress   Neurological examination  Mentation: Alert oriented to time, place, history taking. Follows all commands speech and language fluent Cranial nerve II-XII: Pupils were equal round reactive to light. Extraocular movements were full, visual field were full on confrontational test. Facial sensation and strength were normal. Uvula tongue midline. Head turning and shoulder shrug  were normal and symmetric. Motor: The motor testing reveals 5 over 5 strength of all 4 extremities. Good symmetric motor tone is noted throughout.  Sensory: Sensory testing is intact to soft touch on all 4 extremities. No evidence of extinction is noted.  Coordination: Cerebellar testing reveals good finger-nose-finger and heel-to-shin bilaterally.  Gait and station: Gait is normal. Tandem gait is normal. Romberg is negative. No drift is seen.  Reflexes: Deep tendon reflexes are symmetric and normal bilaterally.   DIAGNOSTIC DATA (LABS, IMAGING, TESTING) - I reviewed patient records, labs, notes, testing  and imaging myself where available.      ASSESSMENT AND PLAN 42 y.o. year old male  has a past medical history of Common migraine (09/28/2017), Gout, Headache syndrome (09/28/2017), Hypothyroid, and Migraine. here with:  1. Headache syndrome- possible  occipital neuralgia  Overall the patient has done well on Topamax.  He will continue Topamax 100 mg at bedtime.  He did not want to increase his dose at this time.  He is advised that if his symptoms worsen or he develops new symptoms he should let us know.  In the future if he has pain that does not  resolve we may consider trigger point injections.  Patient voiced understanding.  He will follow-up in 6 months or sooner if needed.   I spent 15 minutes with the patient. 50% of this time was spent discussing his plan of care   Butch PennyMegan Zyshawn Bohnenkamp, MSN, NP-C 03/27/2018, 8:10 AM [facility] [address], [address] [place], [place] [id number] [phone]

## 2018-03-27 NOTE — Telephone Encounter (Addendum)
Pt [employer] form faxed to [employer] @ [phone]

## 2018-03-27 NOTE — Progress Notes (Signed)
I have read the note, and I agree with the clinical assessment and plan.  Tammra Pressman K Mateen Franssen   

## 2018-03-27 NOTE — Patient Instructions (Signed)
Your Plan:  Continue Topamax 100 mg at bedtime If your symptoms worsen or you develop new symptoms please let us know.       Thank you for coming to see us at [facility]. I hope we have been able to provide you high quality care today.  You may receive a patient satisfaction survey over the next few weeks. We would appreciate your feedback and comments so that we may continue to improve ourselves and the health of our patients.  

## 2018-03-29 DIAGNOSIS — Z0289 Encounter for other administrative examinations: Secondary | ICD-10-CM

## 2018-03-30 NOTE — Telephone Encounter (Signed)
[employer] F[employer]A papers completed, signed, sent to medical records to be processed.

## 2018-03-30 NOTE — Telephone Encounter (Signed)
Signed given to RN

## 2018-03-30 NOTE — Telephone Encounter (Signed)
FMLA completed, forwarded to MM/NP for review and signature.

## 2018-03-31 ENCOUNTER — Telehealth: Payer: Self-pay | Admitting: *Deleted

## 2018-03-31 NOTE — Telephone Encounter (Signed)
Pt copy @ the front desk for p/u.  

## 2018-06-23 ENCOUNTER — Telehealth (INDEPENDENT_AMBULATORY_CARE_PROVIDER_SITE_OTHER): Payer: Self-pay | Admitting: Orthopaedic Surgery

## 2018-06-23 NOTE — Telephone Encounter (Signed)
Patient is established with Dr. Roda ShuttersXu but can only be seen on Mondays due to his work schedule. With him in surgery every Monday patients wife requested he start seeing Dr. Magnus IvanBlackman. Hope this is okay! # [phone]

## 2018-06-26 NOTE — Telephone Encounter (Signed)
See message.    Scheduled for 07/03/18 with Dr Magnus IvanBlackman.   FYI

## 2018-07-03 ENCOUNTER — Ambulatory Visit (INDEPENDENT_AMBULATORY_CARE_PROVIDER_SITE_OTHER): Payer: BLUE CROSS/BLUE SHIELD | Admitting: Orthopaedic Surgery

## 2018-07-08 ENCOUNTER — Emergency Department (HOSPITAL_COMMUNITY)
Admission: EM | Admit: 2018-07-08 | Discharge: 2018-07-08 | Disposition: A | Discharge status: Home / Self Care | Payer: BLUE CROSS/BLUE SHIELD | Attending: Emergency Medicine | Admitting: Emergency Medicine

## 2018-07-08 ENCOUNTER — Encounter (HOSPITAL_COMMUNITY): Payer: Self-pay | Admitting: *Deleted

## 2018-07-08 ENCOUNTER — Emergency Department (HOSPITAL_COMMUNITY): Payer: BLUE CROSS/BLUE SHIELD

## 2018-07-08 ENCOUNTER — Other Ambulatory Visit: Payer: Self-pay

## 2018-07-08 DIAGNOSIS — E039 Hypothyroidism, unspecified: Secondary | ICD-10-CM | POA: Insufficient documentation

## 2018-07-08 DIAGNOSIS — Y9389 Activity, other specified: Secondary | ICD-10-CM | POA: Diagnosis not present

## 2018-07-08 DIAGNOSIS — Y929 Unspecified place or not applicable: Secondary | ICD-10-CM | POA: Insufficient documentation

## 2018-07-08 DIAGNOSIS — Z79899 Other long term (current) drug therapy: Secondary | ICD-10-CM | POA: Insufficient documentation

## 2018-07-08 DIAGNOSIS — X58XXXA Exposure to other specified factors, initial encounter: Secondary | ICD-10-CM | POA: Insufficient documentation

## 2018-07-08 DIAGNOSIS — Y998 Other external cause status: Secondary | ICD-10-CM | POA: Diagnosis not present

## 2018-07-08 DIAGNOSIS — S6991XA Unspecified injury of right wrist, hand and finger(s), initial encounter: Secondary | ICD-10-CM | POA: Diagnosis present

## 2018-07-08 DIAGNOSIS — S60221A Contusion of right hand, initial encounter: Secondary | ICD-10-CM | POA: Diagnosis not present

## 2018-07-08 NOTE — ED Provider Notes (Signed)
[facility]-EMERGENCY DEPT Provider Note   CSN: [id number] Arrival date & time: 07/08/18  2051     History   Chief Complaint Chief Complaint  Patient presents with  . Hand Pain    HPI Thomas Burgess is a 42 y.o. male presented for evaluation of right hand pain.  Patient states over the past week, he has noticed pain of the right hand between his thumb and index finger.  No pain at rest, worse with certain movements and palpation.  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  He has not taken anything for pain including Tylenol or ibuprofen.  Nothing makes it better.  He denies numbness or tingling.  He denies radiation of the pain.  No pain on the other side.  No pain elsewhere in his hand.  He has a history of gout, but states this feels different.   HPI  Past Medical History:  Diagnosis Date  . Common migraine 09/28/2017  . Gout   . Headache syndrome 09/28/2017   Right occipital  . Hypothyroid   . Migraine     Patient Active Problem List   Diagnosis Date Noted  . Headache syndrome 09/28/2017  . Common migraine 09/28/2017  . Unilateral primary osteoarthritis, left knee 09/19/2017    Past Surgical History:  Procedure Laterality Date  . FEMUR SURGERY    . 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 LEG SURGERY     Removed hardware        Home Medications    Prior to Admission medications   Medication Sig Start Date End Date Taking? Authorizing Provider  levothyroxine (SYNTHROID, LEVOTHROID) 112 MCG tablet Take 112 mcg by mouth daily before breakfast.    [provider]  meloxicam (MOBIC) 7.5 MG tablet Take 1 tablet (7.5 mg total) by mouth 2 (two) times daily as needed for pain. 09/19/17   Tarry Kos, MD  naproxen (NAPROSYN) 500 MG tablet Take 1 tablet (500 mg total) by mouth 2 (two) times daily. 02/12/18   Georgetta Haber, NP  rizatriptan (MAXALT) 10 MG tablet Take 1 tablet (10 mg total)  by mouth 3 (three) times daily as needed for migraine. May repeat in 2 hours if needed 11/14/17   York Spaniel, MD  topiramate (TOPAMAX) 100 MG tablet Take 1 tablet (100 mg total) by mouth at bedtime. 03/27/18   Butch Penny, NP    Family History Family History  Problem Relation Age of Onset  . Congestive Heart Failure Mother   . Diabetes Mother   . Kidney failure Father   . Brain cancer Cousin     Social History Social History   Tobacco Use  . Smoking status: Never Smoker  . Smokeless tobacco: Never Used  Substance Use Topics  . Alcohol use: No  . Drug use: No     Allergies   Patient has no known allergies.   Review of Systems Review of Systems  Musculoskeletal: Positive for myalgias.  Neurological: Negative for numbness.     Physical Exam Updated Vital Signs BP 139/87 (BP Location: Right Arm)   Pulse 67   Temp 97.6 F (36.4 C) (Oral)   Resp 18   SpO2 100%   Physical Exam  Constitutional: He is oriented to person, place, and time. He appears well-developed and well-nourished. No distress.  HENT:  Head: Normocephalic and atraumatic.  Eyes: EOM are normal.  Neck:  Normal range of motion.  Pulmonary/Chest: Effort normal.  Abdominal: He exhibits no distension.  Musculoskeletal: Normal range of motion. He exhibits tenderness.  Contusion of the right thenar eminence and webbing between the first and second finger.  Mild swelling.  No pain at the anatomic snuffbox.  Full active range of motion of the wrist, thumb, and fingers.  Sensation intact in all fingers.  Good cap refill.  No tenderness palpation over the bones of his right hand.  Neurological: He is alert and oriented to person, place, and time. No sensory deficit.  Skin: Skin is warm. Capillary refill takes less than 2 seconds. No rash noted.  Psychiatric: He has a normal mood and affect.  Nursing note and vitals reviewed.    ED Treatments / Results  Labs (all labs ordered are listed, but only  abnormal results are displayed) Labs Reviewed - No data to display  EKG None  Radiology Dg Finger Thumb Right  Result Date: 07/08/2018 CLINICAL DATA:  Right thumb pain since there is a without known injury. EXAM: RIGHT THUMB 2+V COMPARISON:  None. FINDINGS: There is no evidence of fracture or dislocation. The interphalangeal and MCP joints are maintained. There is no evidence of arthropathy or other focal bone abnormality. Soft tissues are unremarkable IMPRESSION: No osseous findings for the patient's right thumb pain. Electronically Signed   By: Tollie Ethavid  Kwon M.D.   On: 07/08/2018 21:46    Procedures Procedures (including critical care time)  Medications Ordered in ED Medications - No data to display   Initial Impression / Assessment and Plan / ED Course  I have reviewed the triage vital signs and the nursing notes.  Pertinent labs & imaging results that were available during my care of the patient were reviewed by me and considered in my medical decision making (see chart for details).     Pt presenting for evaluation of right hand pain.  Physical exam reassuring, he is neurovascularly intact.  X-ray viewed interpreted by me, no fractures or dislocations.  Overlying contusion, likely muscle contusion.  Will treat symptomatically with NSAIDs and Tylenol.  Suggested muscle creams the patient does not want to take pills.  Follow-up with PCP as needed.  At this time, patient appears safe for discharge.  Return precautions given.  Patient states he understands and agrees plan.  Final Clinical Impressions(s) / ED Diagnoses   Final diagnoses:  Contusion of right hand, initial encounter    ED Discharge Orders    None       Alveria ApleyCaccavale, Thomas Callander, PA-C 07/08/18 2245    Charlynne PanderYao, David Hsienta, MD 07/08/18 [phone]

## 2018-07-08 NOTE — ED Triage Notes (Signed)
Pt says that he has been having pain in the right thumb area since Thursday, no known injury to the thumb.

## 2018-07-08 NOTE — Discharge Instructions (Addendum)
Take naproxen 2 times a day with meals.  Do not take other anti-inflammatories at the same time open (Advil, Motrin, ibuprofen, Aleve). You may supplement with Tylenol if you need further pain control. Use muscle creams as needed (salonpas, icy hit, bengay) Use ice packs to help control your pain and swelling. Follow-up with your primary care doctor for further evaluation of your symptoms of pain is not improving. Return to the emergency room if you develop any new, worsening, or concerning symptoms.

## 2018-07-29 DIAGNOSIS — M109 Gout, unspecified: Secondary | ICD-10-CM | POA: Insufficient documentation

## 2018-07-29 DIAGNOSIS — E039 Hypothyroidism, unspecified: Secondary | ICD-10-CM | POA: Insufficient documentation

## 2018-10-04 ENCOUNTER — Encounter (INDEPENDENT_AMBULATORY_CARE_PROVIDER_SITE_OTHER): Payer: Self-pay | Admitting: Orthopaedic Surgery

## 2018-10-04 ENCOUNTER — Ambulatory Visit (INDEPENDENT_AMBULATORY_CARE_PROVIDER_SITE_OTHER): Payer: BLUE CROSS/BLUE SHIELD

## 2018-10-04 ENCOUNTER — Telehealth (INDEPENDENT_AMBULATORY_CARE_PROVIDER_SITE_OTHER): Payer: Self-pay

## 2018-10-04 ENCOUNTER — Ambulatory Visit (INDEPENDENT_AMBULATORY_CARE_PROVIDER_SITE_OTHER): Payer: BLUE CROSS/BLUE SHIELD | Admitting: Orthopaedic Surgery

## 2018-10-04 DIAGNOSIS — M1712 Unilateral primary osteoarthritis, left knee: Secondary | ICD-10-CM | POA: Diagnosis not present

## 2018-10-04 MED ORDER — METHYLPREDNISOLONE ACETATE 40 MG/ML IJ SUSP
40.0000 mg | INTRAMUSCULAR | Status: AC | PRN
  Administered 2018-10-04: 40 mg via INTRA_ARTICULAR

## 2018-10-04 MED ORDER — LIDOCAINE HCL 1 % IJ SOLN
2.0000 mL | INTRAMUSCULAR | Status: AC | PRN
  Administered 2018-10-04: 2 mL

## 2018-10-04 MED ORDER — BUPIVACAINE HCL 0.25 % IJ SOLN
2.0000 mL | INTRAMUSCULAR | Status: AC | PRN
  Administered 2018-10-04: 2 mL via INTRA_ARTICULAR

## 2018-10-04 NOTE — Progress Notes (Signed)
Office Visit Note   Patient: Thomas Burgess           Date of Birth: 1976/11/10           MRN: [id number] Visit Date: 10/04/2018              Requested by: Kaleen Mask, MD [address] [place], [place] [address] PCP: Kaleen Mask, MD   Assessment & Plan: Visit Diagnoses:  1. Primary localized osteoarthritis of left knee     Plan: Impression is left knee primary localized osteoarthritis.  Today, we will inject the left knee with cortisone.  We will also send for approval for Visco supplementation injections.  We have discussed the eventual need for total knee replacement but we will try to put this off for as long as possible as he is only 42 years old.  He will continue working on strengthening exercises in the meantime.  Follow-Up Instructions: Return if symptoms worsen or fail to improve.   Orders:  Orders Placed This Encounter  Procedures  . Large Joint Inj: L knee  . XR Knee 1-2 Views Left   No orders of the defined types were placed in this encounter.     Procedures: Large Joint Inj: L knee on 10/04/2018 1:39 PM Indications: pain Details: 22 G needle, anterolateral approach Medications: 2 mL lidocaine 1 %; 2 mL bupivacaine 0.25 %; 40 mg methylPREDNISolone acetate 40 MG/ML      Clinical Data: No additional findings.   Subjective: Chief Complaint  Patient presents with  . Left Knee - Pain    HPI patient is a pleasant 42 year old gentleman who presents to our clinic today with recurrent left knee pain.  History of moderate tricompartmental osteoarthritis which is been ongoing for the past several years.  The pain he has primarily to the medial aspect.  He describes this as a sharp shooting pain which occurs with any activity, specifically with walking.  He denies any locking or catching.  The only instability he gets is secondary to pain preceding this.  He does not take any medications for this.  He has had cortisone injections several  years back which were of minimal relief.  He had a viscosupplementation injection in October 2018 which provided him with 1 months relief at best.  Review of Systems as detailed in HPI.  All others reviewed and are negative.   Objective: Vital Signs: There were no vitals taken for this visit.  Physical Exam well-developed and well-nourished gentleman in no acute distress.  Alert and oriented x3.  Ortho Exam examination of the left knee shows no effusion.  Range of motion 0 to 110 degrees.  Moderate patellofemoral crepitus.  Medial and lateral joint line tenderness.  He is stable valgus varus stress although he does have some pain with valgus stress.  He is neurovascularly intact distally.  Specialty Comments:  No specialty comments available.  Imaging: Xr Knee 1-2 Views Left  Result Date: 10/04/2018 X-rays of left knee demonstrate moderate tract departmental degenerative changes    PMFS History: Patient Active Problem List   Diagnosis Date Noted  . Headache syndrome 09/28/2017  . Common migraine 09/28/2017  . Primary localized osteoarthritis of left knee 09/19/2017   Past Medical History:  Diagnosis Date  . Common migraine 09/28/2017  . Gout   . Headache syndrome 09/28/2017   Right occipital  . Hypothyroid   . Migraine     Family History  Problem Relation Age of Onset  .  Congestive Heart Failure Mother   . Diabetes Mother   . Kidney failure Father   . Brain cancer Cousin     Past Surgical History:  Procedure Laterality Date  . FEMUR SURGERY    . 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 LEG SURGERY     Removed hardware   Social History   Occupational History  . Occupation: [employer]  Tobacco Use  . Smoking status: Never Smoker  . Smokeless tobacco: Never Used  Substance and Sexual Activity  . Alcohol use: No  . Drug use: No  . Sexual activity: Not on file

## 2018-10-04 NOTE — Telephone Encounter (Signed)
Please submit for gel inj LEFT KNEE- DR Roda Shutters

## 2018-10-06 NOTE — Telephone Encounter (Signed)
Noted  

## 2018-10-09 ENCOUNTER — Encounter: Payer: Self-pay | Admitting: Adult Health

## 2018-10-09 ENCOUNTER — Ambulatory Visit (INDEPENDENT_AMBULATORY_CARE_PROVIDER_SITE_OTHER): Payer: BLUE CROSS/BLUE SHIELD | Admitting: Adult Health

## 2018-10-09 ENCOUNTER — Telehealth: Payer: Self-pay | Admitting: *Deleted

## 2018-10-09 VITALS — BP 137/80 | HR 97 | Ht 74.0 in | Wt 228.6 lb

## 2018-10-09 DIAGNOSIS — G4489 Other headache syndrome: Secondary | ICD-10-CM | POA: Diagnosis not present

## 2018-10-09 NOTE — Progress Notes (Signed)
PATIENT: Thomas Burgess DOB: 25-Sep-1976  REASON FOR VISIT: follow up HISTORY FROM: patient  HISTORY OF PRESENT ILLNESS: Today 10/09/18: Thomas Burgess is a 42 year old male with a history of headaches and possible occipital neuralgia.  He returns today for follow-up.  He continues to have approximately 1-3 headaches a month.  His headaches always occur in the right occipital region.  He describes it as a sharp shooting pain.  He denies light or noise sensitivity.  He also have nausea but no vomiting.  He states that he typically can take 1-2 doses of Maxalt and go to sleep but has had a full resolve within 24 hours.  He returns today for evaluation.   HISTORY 03/27/18 Thomas Burgess is a 42 year old male with a history of headaches and possible occipital neuralgia.  He returns today for follow-up.  He reports that his discomfort has been manageable with Topamax.  He states that he has 1-2 headaches a month.  Reports that the headaches always occur in the right occipital region.  He states that he can point to the exact spot.  He describes it as a sharp shooting pain.  He states that they typically last 24 hours despite taking Maxalt.  He denies photophobia, phonophobia, nausea and vomiting.  He feels that this is manageable at this time.  He does not want to increase Topamax.  REVIEW OF SYSTEMS: Out of a complete 14 system review of symptoms, the patient complains only of the following symptoms, and all other reviewed systems are negative.  See HPI  ALLERGIES: No Known Allergies  HOME MEDICATIONS: Outpatient Medications Prior to Visit  Medication Sig Dispense Refill  . allopurinol (ZYLOPRIM) 100 MG tablet allopurinol 100 mg tablet  1 tablet by mouth daily    . levothyroxine (SYNTHROID, LEVOTHROID) 112 MCG tablet Take 112 mcg by mouth daily before breakfast.    . rizatriptan (MAXALT) 10 MG tablet Take 1 tablet (10 mg total) by mouth 3 (three) times daily as needed for migraine. May repeat in 2  hours if needed 10 tablet 5  . topiramate (TOPAMAX) 100 MG tablet Take 1 tablet (100 mg total) by mouth at bedtime. 90 tablet 3   No facility-administered medications prior to visit.     PAST MEDICAL HISTORY: Past Medical History:  Diagnosis Date  . Common migraine 09/28/2017  . Gout   . Headache syndrome 09/28/2017   Right occipital  . Hypothyroid   . Migraine     PAST SURGICAL HISTORY: Past Surgical History:  Procedure Laterality Date  . FEMUR SURGERY    . 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 LEG SURGERY     Removed hardware    FAMILY HISTORY: Family History  Problem Relation Age of Onset  . Congestive Heart Failure Mother   . Diabetes Mother   . Kidney failure Father   . Brain cancer Cousin     SOCIAL HISTORY: Social History   Socioeconomic History  . Marital status: Married    Spouse name: Not on file  . Number of children: 1  . Years of education: 69  . Highest education level: Not on file  Occupational History  . Occupation: [employer]  . Financial resource strain: Not on file  . Food insecurity:    Worry: Not on file    Inability: Not on file  . Transportation needs:    Medical: Not on file    Non-medical: Not on file  Tobacco Use  . Smoking status: Never Smoker  . Smokeless tobacco: Never Used  Substance and Sexual Activity  . Alcohol use: No  . Drug use: No  . Sexual activity: Not on file  Lifestyle  . Physical activity:    Days per week: Not on file    Minutes per session: Not on file  . Stress: Not on file  Relationships  . Social connections:    Talks on phone: Not on file    Gets together: Not on file    Attends religious service: Not on file    Active member of club or organization: Not on file    Attends meetings of clubs or organizations: Not on file    Relationship status: Not on file  . Intimate partner violence:    Fear of current or ex partner: Not on file    Emotionally abused: Not on file     Physically abused: Not on file    Forced sexual activity: Not on file  Other Topics Concern  . Not on file  Social History Narrative   Lives with wife, daughter   Caffeine use: soft drinks daily      Right handed       PHYSICAL EXAM  Vitals:   10/09/18 0709  BP: 137/80  Pulse: 97  Weight: 228 lb 9.6 oz (103.7 kg)  Height: 6\' 2"  (1.88 m)   Body mass index is 29.35 kg/m.  Generalized: Well developed, in no acute distress   Neurological examination  Mentation: Alert oriented to time, place, history taking. Follows all commands speech and language fluent Cranial nerve II-XII: Pupils were equal round reactive to light. Extraocular movements were full, visual field were full on confrontational test. Facial sensation and strength were normal. Uvula tongue midline. Head turning and shoulder shrug  were normal and symmetric. Motor: The motor testing reveals 5 over 5 strength of all 4 extremities. Good symmetric motor tone is noted throughout.  Sensory: Sensory testing is intact to soft touch on all 4 extremities. No evidence of extinction is noted.  Coordination: Cerebellar testing reveals good finger-nose-finger and heel-to-shin bilaterally.  Gait and station: Gait is normal. Tandem gait is normal. Romberg is negative. No drift is seen.  Reflexes: Deep tendon reflexes are symmetric and normal bilaterally.   DIAGNOSTIC DATA (LABS, IMAGING, TESTING) - I reviewed patient records, labs, notes, testing and imaging myself where available.     ASSESSMENT AND PLAN 42 y.o. year old male  has a past medical history of Common migraine (09/28/2017), Gout, Headache syndrome (09/28/2017), Hypothyroid, and Migraine. here with:  1.  Migraine headaches/occipital neuralgia  The patient will continue on Topamax.  He will continue with Maxalt as needed.  I have encouraged him to keep a headache diary to identify potential triggers..  If his symptoms worsen or he develops new symptoms he should let  us know.  He will follow-up in 6 to 7 months or sooner if needed.   I spent 15 minutes with the patient. 50% of this time was spent reviewing plan of care   Thomas Penny, MSN, NP-C 10/09/2018, 7:28 AM [facility] [address], [address] [place], [place] [address] [phone]

## 2018-10-09 NOTE — Patient Instructions (Signed)
Your Plan:  Continue topamax If your symptoms worsen or you develop new symptoms please let us know.       Thank you for coming to see us at [facility]. I hope we have been able to provide you high quality care today.  You may receive a patient satisfaction survey over the next few weeks. We would appreciate your feedback and comments so that we may continue to improve ourselves and the health of our patients.  

## 2018-10-09 NOTE — Telephone Encounter (Signed)
Pt FMLA form on [employer] desk. 

## 2018-10-09 NOTE — Progress Notes (Signed)
The blood work results are unremarkable. Please call the patient.  

## 2018-10-10 ENCOUNTER — Telehealth (INDEPENDENT_AMBULATORY_CARE_PROVIDER_SITE_OTHER): Payer: Self-pay

## 2018-10-10 DIAGNOSIS — Z0289 Encounter for other administrative examinations: Secondary | ICD-10-CM

## 2018-10-10 NOTE — Telephone Encounter (Signed)
Submitted VOB for SynviscOne, left knee. 

## 2018-10-11 ENCOUNTER — Encounter (INDEPENDENT_AMBULATORY_CARE_PROVIDER_SITE_OTHER): Payer: Self-pay | Admitting: Orthopaedic Surgery

## 2018-10-13 NOTE — Telephone Encounter (Signed)
[place] [employer] FMLA papers ready for NP's review and signature.

## 2018-10-20 NOTE — Telephone Encounter (Signed)
Signed.  To Stanton Kidney in MR.

## 2018-10-23 NOTE — Telephone Encounter (Signed)
Pt fmla form faxed to [employer] @ [phone]

## 2018-10-31 ENCOUNTER — Telehealth (INDEPENDENT_AMBULATORY_CARE_PROVIDER_SITE_OTHER): Payer: Self-pay

## 2018-10-31 NOTE — Telephone Encounter (Signed)
PA required for SynviscOne, left knee.  Initiated PA with Thomas Burgess at [employer].  Stated that PA is pending due to verifying if the provider is in network.  Reference# [id number].

## 2018-11-10 ENCOUNTER — Telehealth (INDEPENDENT_AMBULATORY_CARE_PROVIDER_SITE_OTHER): Payer: Self-pay

## 2018-11-10 NOTE — Telephone Encounter (Signed)
Called and left a VM for patient to call back to schedule an appointment with Dr. Roda ShuttersXu for gel injection.  Patient is approved for SynviscOne, left knee. Buy & Bill Patient will be responsible for 20% OOP. PA required PA Approval# [id number] Valid 10/31/2018- 04/28/2019

## 2018-12-16 ENCOUNTER — Encounter (HOSPITAL_COMMUNITY): Payer: Self-pay | Admitting: Nurse Practitioner

## 2018-12-16 DIAGNOSIS — G43009 Migraine without aura, not intractable, without status migrainosus: Secondary | ICD-10-CM | POA: Insufficient documentation

## 2018-12-16 DIAGNOSIS — Z79890 Hormone replacement therapy (postmenopausal): Secondary | ICD-10-CM | POA: Diagnosis not present

## 2018-12-16 DIAGNOSIS — M109 Gout, unspecified: Secondary | ICD-10-CM | POA: Diagnosis not present

## 2018-12-16 DIAGNOSIS — R1033 Periumbilical pain: Secondary | ICD-10-CM | POA: Diagnosis present

## 2018-12-16 DIAGNOSIS — E039 Hypothyroidism, unspecified: Secondary | ICD-10-CM | POA: Insufficient documentation

## 2018-12-16 DIAGNOSIS — K3580 Unspecified acute appendicitis: Principal | ICD-10-CM | POA: Insufficient documentation

## 2018-12-16 DIAGNOSIS — Z79899 Other long term (current) drug therapy: Secondary | ICD-10-CM | POA: Insufficient documentation

## 2018-12-16 LAB — CBC
HCT: 41.5 % (ref 39.0–52.0)
HEMOGLOBIN: 13.6 g/dL (ref 13.0–17.0)
MCH: 30.6 pg (ref 26.0–34.0)
MCHC: 32.8 g/dL (ref 30.0–36.0)
MCV: 93.3 fL (ref 80.0–100.0)
NRBC: 0 % (ref 0.0–0.2)
PLATELETS: 222 10*3/uL (ref 150–400)
RBC: 4.45 MIL/uL (ref 4.22–5.81)
RDW: 13.2 % (ref 11.5–15.5)
WBC: 10 10*3/uL (ref 4.0–10.5)

## 2018-12-16 LAB — COMPREHENSIVE METABOLIC PANEL
ALBUMIN: 4.4 g/dL (ref 3.5–5.0)
ALT: 32 U/L (ref 0–44)
ANION GAP: 9 (ref 5–15)
AST: 26 U/L (ref 15–41)
Alkaline Phosphatase: 78 U/L (ref 38–126)
BUN: 16 mg/dL (ref 6–20)
CO2: 23 mmol/L (ref 22–32)
Calcium: 9.1 mg/dL (ref 8.9–10.3)
Chloride: 107 mmol/L (ref 98–111)
Creatinine, Ser: 1.22 mg/dL (ref 0.61–1.24)
GFR calc non Af Amer: >60 mL/min (ref >60)
Glucose, Bld: 112 mg/dL — ABNORMAL HIGH (ref 70–99)
POTASSIUM: 4 mmol/L (ref 3.5–5.1)
SODIUM: 139 mmol/L (ref 135–145)
TOTAL PROTEIN: 7.4 g/dL (ref 6.5–8.1)
Total Bilirubin: 0.6 mg/dL (ref 0.3–1.2)

## 2018-12-16 LAB — LIPASE, BLOOD: Lipase: 39 U/L (ref 11–51)

## 2018-12-16 NOTE — ED Triage Notes (Signed)
Pt is c/o umbilical abdominal pain with associated bloating and gas.

## 2018-12-17 ENCOUNTER — Emergency Department (HOSPITAL_COMMUNITY): Payer: BLUE CROSS/BLUE SHIELD

## 2018-12-17 ENCOUNTER — Encounter (HOSPITAL_COMMUNITY): Payer: Self-pay

## 2018-12-17 ENCOUNTER — Other Ambulatory Visit: Payer: Self-pay

## 2018-12-17 ENCOUNTER — Observation Stay (HOSPITAL_COMMUNITY)
Admission: EM | Admit: 2018-12-17 | Discharge: 2018-12-19 | Disposition: A | Discharge status: Home / Self Care | Payer: BLUE CROSS/BLUE SHIELD | Attending: General Surgery | Admitting: General Surgery

## 2018-12-17 DIAGNOSIS — R1033 Periumbilical pain: Secondary | ICD-10-CM | POA: Diagnosis present

## 2018-12-17 DIAGNOSIS — R1031 Right lower quadrant pain: Secondary | ICD-10-CM

## 2018-12-17 LAB — URINALYSIS, ROUTINE W REFLEX MICROSCOPIC
Bilirubin Urine: NEGATIVE
GLUCOSE, UA: NEGATIVE mg/dL
Hgb urine dipstick: NEGATIVE
KETONES UR: NEGATIVE mg/dL
LEUKOCYTES UA: NEGATIVE
Nitrite: NEGATIVE
PH: 6 (ref 5.0–8.0)
Protein, ur: NEGATIVE mg/dL
SPECIFIC GRAVITY, URINE: 1.017 (ref 1.005–1.030)

## 2018-12-17 MED ORDER — MORPHINE SULFATE (PF) 4 MG/ML IV SOLN
4.0000 mg | INTRAVENOUS | Status: DC | PRN
  Administered 2018-12-17 – 2018-12-18 (×7): 4 mg via INTRAVENOUS
  Filled 2018-12-17 (×7): qty 1

## 2018-12-17 MED ORDER — SUMATRIPTAN SUCCINATE 50 MG PO TABS
100.0000 mg | ORAL_TABLET | ORAL | Status: DC | PRN
  Filled 2018-12-17: qty 2

## 2018-12-17 MED ORDER — ACETAMINOPHEN 325 MG PO TABS
650.0000 mg | ORAL_TABLET | Freq: Four times a day (QID) | ORAL | Status: DC | PRN
  Administered 2018-12-17 – 2018-12-18 (×4): 650 mg via ORAL
  Filled 2018-12-17 (×5): qty 2

## 2018-12-17 MED ORDER — LEVOTHYROXINE SODIUM 112 MCG PO TABS
112.0000 ug | ORAL_TABLET | ORAL | Status: DC
  Administered 2018-12-18 – 2018-12-19 (×2): 112 ug via ORAL
  Filled 2018-12-17 (×2): qty 1

## 2018-12-17 MED ORDER — ENOXAPARIN SODIUM 40 MG/0.4ML ~~LOC~~ SOLN
40.0000 mg | Freq: Every day | SUBCUTANEOUS | Status: DC
  Administered 2018-12-17 – 2018-12-18 (×2): 40 mg via SUBCUTANEOUS
  Filled 2018-12-17 (×2): qty 0.4

## 2018-12-17 MED ORDER — MORPHINE SULFATE (PF) 4 MG/ML IV SOLN
4.0000 mg | Freq: Once | INTRAVENOUS | Status: AC
  Administered 2018-12-17: 4 mg via INTRAVENOUS
  Filled 2018-12-17: qty 1

## 2018-12-17 MED ORDER — SODIUM CHLORIDE (PF) 0.9 % IJ SOLN
INTRAMUSCULAR | Status: AC
  Filled 2018-12-17: qty 50

## 2018-12-17 MED ORDER — ONDANSETRON 4 MG PO TBDP: 4.0000 mg | ORAL_TABLET | Freq: Four times a day (QID) | ORAL | Status: DC | PRN

## 2018-12-17 MED ORDER — LEVOTHYROXINE SODIUM 112 MCG PO TABS
112.0000 ug | ORAL_TABLET | ORAL | Status: AC
  Administered 2018-12-17: 112 ug via ORAL
  Filled 2018-12-17: qty 1

## 2018-12-17 MED ORDER — ONDANSETRON HCL 4 MG/2ML IJ SOLN
4.0000 mg | Freq: Once | INTRAMUSCULAR | Status: AC
  Administered 2018-12-17: 4 mg via INTRAVENOUS
  Filled 2018-12-17: qty 2

## 2018-12-17 MED ORDER — POTASSIUM CHLORIDE IN NACL 20-0.9 MEQ/L-% IV SOLN
INTRAVENOUS | Status: DC
  Administered 2018-12-17 – 2018-12-18 (×3): via INTRAVENOUS
  Filled 2018-12-17 (×3): qty 1000

## 2018-12-17 MED ORDER — FLUOXETINE HCL 20 MG PO CAPS
20.0000 mg | ORAL_CAPSULE | Freq: Every day | ORAL | Status: DC
  Administered 2018-12-17 – 2018-12-19 (×3): 20 mg via ORAL
  Filled 2018-12-17 (×3): qty 1

## 2018-12-17 MED ORDER — IOPAMIDOL (ISOVUE-300) INJECTION 61%
100.0000 mL | Freq: Once | INTRAVENOUS | Status: AC | PRN
  Administered 2018-12-17: 100 mL via INTRAVENOUS

## 2018-12-17 MED ORDER — LEVOTHYROXINE SODIUM 112 MCG PO TABS: 224.0000 ug | ORAL_TABLET | ORAL | Status: DC

## 2018-12-17 MED ORDER — ACETAMINOPHEN 650 MG RE SUPP: 650.0000 mg | Freq: Four times a day (QID) | RECTAL | Status: DC | PRN

## 2018-12-17 MED ORDER — ONDANSETRON HCL 4 MG/2ML IJ SOLN
4.0000 mg | Freq: Four times a day (QID) | INTRAMUSCULAR | Status: DC | PRN
  Administered 2018-12-18: 4 mg via INTRAVENOUS
  Filled 2018-12-17: qty 2

## 2018-12-17 MED ORDER — ALLOPURINOL 100 MG PO TABS
100.0000 mg | ORAL_TABLET | Freq: Every day | ORAL | Status: DC
  Administered 2018-12-17 – 2018-12-18 (×2): 100 mg via ORAL
  Filled 2018-12-17 (×4): qty 1

## 2018-12-17 MED ORDER — SODIUM CHLORIDE 0.9 % IV BOLUS
1000.0000 mL | Freq: Once | INTRAVENOUS | Status: AC
  Administered 2018-12-17: 1000 mL via INTRAVENOUS

## 2018-12-17 MED ORDER — IOPAMIDOL (ISOVUE-300) INJECTION 61%
INTRAVENOUS | Status: AC
  Filled 2018-12-17: qty 100

## 2018-12-17 MED ORDER — TOPIRAMATE 100 MG PO TABS
100.0000 mg | ORAL_TABLET | Freq: Every day | ORAL | Status: DC
  Administered 2018-12-17 – 2018-12-18 (×2): 100 mg via ORAL
  Filled 2018-12-17 (×2): qty 1

## 2018-12-17 NOTE — ED Notes (Signed)
Report has been given to 5w RN.

## 2018-12-17 NOTE — ED Provider Notes (Signed)
[facility]-EMERGENCY DEPT Provider Note   CSN: [id number] Arrival date & time: 12/16/18  2111     History   Chief Complaint Chief Complaint  Patient presents with  . Abdominal Pain    HPI Thomas Burgess is a 42 y.o. male.  HPI Patient presents to the emergency department with abdominal pain that started yesterday morning.  Patient states that he has had decreased oral intake and not wanting to eat during this timeframe.  Patient states that he has had no known fevers.  Patient states he did take some Tylenol without significant relief of his symptoms.  Patient states he has had several episodes of diarrhea.  Patient states that nothing seems to make the condition better but certain positions and palpation make the pain worse.  The patient denies chest pain, shortness of breath, headache,blurred vision, neck pain, fever, cough, weakness, numbness, dizziness, edema,  vomiting, rash, back pain, dysuria, hematemesis, bloody stool, near syncope, or syncope. Past Medical History:  Diagnosis Date  . Common migraine 09/28/2017  . Gout   . Headache syndrome 09/28/2017   Right occipital  . Hypothyroid   . Migraine     Patient Active Problem List   Diagnosis Date Noted  . Headache syndrome 09/28/2017  . Common migraine 09/28/2017  . Primary localized osteoarthritis of left knee 09/19/2017    Past Surgical History:  Procedure Laterality Date  . FEMUR SURGERY    . 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 LEG SURGERY     Removed hardware        Home Medications    Prior to Admission medications   Medication Sig Start Date End Date Taking? Authorizing Provider  allopurinol (ZYLOPRIM) 100 MG tablet Take 100 mg by mouth at bedtime.    Yes [provider]  FLUoxetine (PROZAC) 20 MG tablet Take 20 mg by mouth daily.   Yes [provider]  levothyroxine (SYNTHROID, LEVOTHROID) 112 MCG tablet Take 112 mcg by mouth See admin  instructions. Takes 112 mcg every day, except Wednesday take 224 mcg.   Yes [provider]  rizatriptan (MAXALT) 10 MG tablet Take 1 tablet (10 mg total) by mouth 3 (three) times daily as needed for migraine. May repeat in 2 hours if needed 11/14/17  Yes York Spaniel, MD  topiramate (TOPAMAX) 100 MG tablet Take 1 tablet (100 mg total) by mouth at bedtime. 03/27/18  Yes Butch Penny, NP    Family History Family History  Problem Relation Age of Onset  . Congestive Heart Failure Mother   . Diabetes Mother   . Kidney failure Father   . Brain cancer Cousin     Social History Social History   Tobacco Use  . Smoking status: Never Smoker  . Smokeless tobacco: Never Used  Substance Use Topics  . Alcohol use: No  . Drug use: No     Allergies   Patient has no known allergies.   Review of Systems Review of Systems  All other systems negative except as documented in the HPI. All pertinent positives and negatives as reviewed in the HPI. Physical Exam Updated Vital Signs BP 134/87 (BP Location: Left Arm)   Pulse 61   Temp 97.7 F (36.5 C) (Oral)   Resp 18   SpO2 100%   Physical Exam Vitals signs and nursing note reviewed.  Constitutional:      General: He is not in acute distress.    Appearance: He is well-developed.  HENT:     Head: Normocephalic and atraumatic.  Eyes:     Pupils: Pupils are equal, round, and reactive to light.  Neck:     Musculoskeletal: Normal range of motion and neck supple.  Cardiovascular:     Rate and Rhythm: Normal rate and regular rhythm.     Heart sounds: Normal heart sounds. No murmur. No friction rub. No gallop.   Pulmonary:     Effort: Pulmonary effort is normal. No respiratory distress.     Breath sounds: Normal breath sounds. No wheezing.  Abdominal:     General: Bowel sounds are normal. There is no distension.     Palpations: Abdomen is soft.     Tenderness: There is abdominal tenderness in the right lower quadrant and  periumbilical area. There is guarding.     Hernia: No hernia is present.  Skin:    General: Skin is warm and dry.     Capillary Refill: Capillary refill takes less than 2 seconds.     Findings: No erythema or rash.  Neurological:     Mental Status: He is alert and oriented to person, place, and time.     Motor: No abnormal muscle tone.     Coordination: Coordination normal.  Psychiatric:        Behavior: Behavior normal.      ED Treatments / Results  Labs (all labs ordered are listed, but only abnormal results are displayed) Labs Reviewed  COMPREHENSIVE METABOLIC PANEL - Abnormal; Notable for the following components:      Result Value   Glucose, Bld 112 (*)    All other components within normal limits  LIPASE, BLOOD  CBC  URINALYSIS, ROUTINE W REFLEX MICROSCOPIC    EKG None  Radiology Ct Abdomen Pelvis W Contrast  Result Date: 12/17/2018 CLINICAL DATA:  Acute onset of generalized abdominal pain and nausea. EXAM: CT ABDOMEN AND PELVIS WITH CONTRAST TECHNIQUE: Multidetector CT imaging of the abdomen and pelvis was performed using the standard protocol following bolus administration of intravenous contrast. CONTRAST:  100mL ISOVUE-300 IOPAMIDOL (ISOVUE-300) INJECTION 61% COMPARISON:  CT of the abdomen and pelvis performed 05/14/2005 FINDINGS: Lower chest: Minimal atelectasis is noted at the lung bases. The visualized portions of the mediastinum are unremarkable. Hepatobiliary: The liver is unremarkable in appearance. The gallbladder is unremarkable in appearance. The common bile duct remains normal in caliber. Pancreas: The pancreas is within normal limits. Spleen: The spleen is enlarged, measuring 15.0 cm in length. Adrenals/Urinary Tract: The adrenal glands are unremarkable in appearance. The kidneys are within normal limits. There is no evidence of hydronephrosis. No renal or ureteral stones are identified. No perinephric stranding is seen. Stomach/Bowel: The stomach is  unremarkable in appearance. The small bowel is within normal limits. Trace inflammation is noted about the appendix, though it remains normal in caliber. There is no definite evidence of appendicitis. The colon is unremarkable in appearance. Vascular/Lymphatic: The abdominal aorta is unremarkable in appearance. The inferior vena cava is grossly unremarkable. No retroperitoneal lymphadenopathy is seen. No pelvic sidewall lymphadenopathy is identified. Reproductive: The bladder is mildly distended and grossly unremarkable. The prostate is normal in size. Other: No additional soft tissue abnormalities are seen. Musculoskeletal: No acute osseous abnormalities are identified. The visualized musculature is unremarkable in appearance. IMPRESSION: 1. No acute abnormality seen within the abdomen or pelvis. 2. Splenomegaly. Electronically Signed   By: Roanna RaiderJeffery  Chang M.D.   On: 12/17/2018 05:04    Procedures Procedures (including critical care time)  Medications Ordered  in ED Medications  iopamidol (ISOVUE-300) 61 % injection (has no administration in time range)  sodium chloride (PF) 0.9 % injection (has no administration in time range)  sodium chloride 0.9 % bolus 1,000 mL (0 mLs Intravenous Stopped 12/17/18 0544)  morphine 4 MG/ML injection 4 mg (4 mg Intravenous Given 12/17/18 0431)  ondansetron (ZOFRAN) injection 4 mg (4 mg Intravenous Given 12/17/18 0430)  iopamidol (ISOVUE-300) 61 % injection 100 mL (100 mLs Intravenous Contrast Given 12/17/18 0441)     Initial Impression / Assessment and Plan / ED Course  I have reviewed the triage vital signs and the nursing notes.  Pertinent labs & imaging results that were available during my care of the patient were reviewed by me and considered in my medical decision making (see chart for details).     Patient is exquisitely tender in the right lower quadrant and based off the reading through the body of the CT scan results I feel that this trace  inflammation about the appendix could be a concern in addition to his examination.  I did speak with Dr. Cliffton AstersWhite of general surgery who will evaluate the patient.  Patient is made aware of the plan and all questions were answered.  Final Clinical Impressions(s) / ED Diagnoses   Final diagnoses:  None    ED Discharge Orders    None       Charlestine NightLawyer, Quavion Boule, PA-C 12/17/18 [id number]    Pricilla LovelessGoldston, Scott, MD 12/17/18 [phone]

## 2018-12-17 NOTE — ED Notes (Signed)
ED TO INPATIENT HANDOFF REPORT  Name/Age/Gender Thomas Thomas Burgess L Thomas Burgess 42 y.o. male  Code Status    Code Status Orders  (From admission, onward)         Start     Ordered   12/17/18 0949  Full code  Continuous     12/17/18 0948        Code Status History    This patient has a current code status but no historical code status.      Home/SNF/Other Home  Chief Complaint Abdominal Pain   Level of Care/Admitting Diagnosis ED Disposition    ED Disposition Condition Comment   Admit  [facility] [[id number]]  Level of Care: Med-Surg [16]  Diagnosis: Periumbilical abdominal pain [161096][250831]  Admitting Physician: Thomas Thomas Burgess, Thomas [[id number]]  Attending Physician: Glenna Thomas Burgess, Thomas [[id number]]  PT Class (Do Not Modify): Observation [104]  PT Acc Code (Do Not Modify): Observation [10022]       Medical History Past Medical History:  Diagnosis Date  . Common migraine 09/28/2017  . Gout   . Headache syndrome 09/28/2017   Right occipital  . Hypothyroid   . Migraine     Allergies No Known Allergies  IV Location/Drains/Wounds Patient Lines/Drains/Airways Status   Active Line/Drains/Airways    Name:   Placement date:   Placement time:   Site:   Days:   Peripheral IV 12/17/18 Left Antecubital   12/17/18    0426    Antecubital   less than 1          Labs/Imaging Results for orders placed or performed during the hospital encounter of 12/17/18 (from the past 48 hour(s))  Lipase, blood     Status: None   Collection Time: 12/16/18  9:59 PM  Result Value Ref Range   Lipase 39 11 - 51 U/L    Comment: Performed at [facility], [address] [place], [place] [address]  Comprehensive metabolic panel     Status: Abnormal   Collection Time: 12/16/18  9:59 PM  Result Value Ref Range   Sodium 139 135 - 145 mmol/L   Potassium 4.0 3.5 - 5.1 mmol/L   Chloride 107 98 - 111 mmol/L   CO2 23 22 - 32 mmol/L   Glucose, Bld 112 (H) 70 - 99  mg/dL   BUN 16 6 - 20 mg/dL   Creatinine, Ser 9.811.22 0.61 - 1.24 mg/dL   Calcium 9.1 8.9 - 19.110.3 mg/dL   Total Protein 7.4 6.5 - 8.1 g/dL   Albumin 4.4 3.5 - 5.0 g/dL   AST 26 15 - 41 U/L   ALT 32 0 - 44 U/L   Alkaline Phosphatase 78 38 - 126 U/L   Total Bilirubin 0.6 0.3 - 1.2 mg/dL   GFR calc non Af Amer >60 >60 mL/min   GFR calc Af Amer >60 >60 mL/min   Anion gap 9 5 - 15    Comment: Performed at [facility], [address] [place], [place] [address]  CBC     Status: None   Collection Time: 12/16/18  9:59 PM  Result Value Ref Range   WBC 10.0 4.0 - 10.5 K/uL   RBC 4.45 4.22 - 5.81 MIL/uL   Hemoglobin 13.6 13.0 - 17.0 g/dL   HCT 56.241.5 13.039.0 - 86.552.0 %   MCV 93.3 80.0 - 100.0 fL   MCH 30.6 26.0 - 34.0 pg   MCHC 32.8 30.0 - 36.0 g/dL   RDW 78.413.2 69.611.5 -  15.5 %   Platelets 222 150 - 400 K/uL   nRBC 0.0 0.0 - 0.2 %    Comment: Performed at [facility], [address], [place], [place] [address]  Urinalysis, Routine w reflex microscopic     Status: None   Collection Time: 12/17/18  4:07 AM  Result Value Ref Range   Color, Urine YELLOW YELLOW   APPearance CLEAR CLEAR   Specific Gravity, Urine 1.017 1.005 - 1.030   pH 6.0 5.0 - 8.0   Glucose, UA NEGATIVE NEGATIVE mg/dL   Hgb urine dipstick NEGATIVE NEGATIVE   Bilirubin Urine NEGATIVE NEGATIVE   Ketones, ur NEGATIVE NEGATIVE mg/dL   Protein, ur NEGATIVE NEGATIVE mg/dL   Nitrite NEGATIVE NEGATIVE   Leukocytes, UA NEGATIVE NEGATIVE    Comment: Performed at [facility], [address], [place], [place] [address]   Ct Abdomen Pelvis W Contrast  Result Date: 12/17/2018 CLINICAL DATA:  Acute onset of generalized abdominal pain and nausea. EXAM: CT ABDOMEN AND PELVIS WITH CONTRAST TECHNIQUE: Multidetector CT imaging of the abdomen and pelvis was performed using the standard protocol following bolus administration of intravenous contrast. CONTRAST:  ISOVUE-300 IOPAMIDOL  (ISOVUE-300) INJECTION 61% COMPARISON:  CT of the abdomen and pelvis performed 05/14/2005 FINDINGS: Lower chest: Minimal atelectasis is noted at the lung bases. The visualized portions of the mediastinum are unremarkable. Hepatobiliary: The liver is unremarkable in appearance. The gallbladder is unremarkable in appearance. The common bile duct remains normal in caliber. Pancreas: The pancreas is within normal limits. Spleen: The spleen is enlarged, measuring 15.0 cm in length. Adrenals/Urinary Tract: The adrenal glands are unremarkable in appearance. The kidneys are within normal limits. There is no evidence of hydronephrosis. No renal or ureteral stones are identified. No perinephric stranding is seen. Stomach/Bowel: The stomach is unremarkable in appearance. The small bowel is within normal limits. Trace inflammation is noted about the appendix, though it remains normal in caliber. There is no definite evidence of appendicitis. The colon is unremarkable in appearance. Vascular/Lymphatic: The abdominal aorta is unremarkable in appearance. The inferior vena cava is grossly unremarkable. No retroperitoneal lymphadenopathy is seen. No pelvic sidewall lymphadenopathy is identified. Reproductive: The bladder is mildly distended and grossly unremarkable. The prostate is normal in size. Other: No additional soft tissue abnormalities are seen. Musculoskeletal: No acute osseous abnormalities are identified. The visualized musculature is unremarkable in appearance. IMPRESSION: 1. No acute abnormality seen within the abdomen or pelvis. 2. Splenomegaly. Electronically Signed   By: Thomas Thomas Burgess M.D.   On: 12/17/2018 05:04   None  Pending Labs Unresulted Labs (From admission, onward)    Start     Ordered   12/18/18 0500  CBC  Tomorrow morning,   R     12/17/18 0948   Signed and Held  HIV antibody (Routine Testing)  Once,   R     Signed and Held          Vitals/Pain Today's Vitals   12/17/18 0746 12/17/18 0944  12/17/18 0954 12/17/18 1000  BP:   124/63 118/69  Pulse:   67 62  Resp:   18 17  Temp:      TempSrc:      SpO2:   97% 98%  PainSc: 9  5       Isolation Precautions No active isolations  Medications Medications  iopamidol (ISOVUE-300) 61 % injection (has no administration in time range)  sodium chloride (PF) 0.9 % injection (has no administration in time  range)  allopurinol (ZYLOPRIM) tablet 100 mg (has no administration in time range)  SUMAtriptan (IMITREX) tablet 100 mg (has no administration in time range)  FLUoxetine (PROZAC) capsule 20 mg (has no administration in time range)  topiramate (TOPAMAX) tablet 100 mg (has no administration in time range)  acetaminophen (TYLENOL) tablet 650 mg (650 mg Oral Given 12/17/18 1015)    Or  acetaminophen (TYLENOL) suppository 650 mg ( Rectal See Alternative 12/17/18 1015)  morphine 4 MG/ML injection 4 mg (has no administration in time range)  ondansetron (ZOFRAN-ODT) disintegrating tablet 4 mg (has no administration in time range)    Or  ondansetron (ZOFRAN) injection 4 mg (has no administration in time range)  levothyroxine (SYNTHROID, LEVOTHROID) tablet 112 mcg (has no administration in time range)    And  levothyroxine (SYNTHROID, LEVOTHROID) tablet 224 mcg (has no administration in time range)  sodium chloride 0.9 % bolus 1,000 mL (0 mLs Intravenous Stopped 12/17/18 0544)  morphine 4 MG/ML injection 4 mg (4 mg Intravenous Given 12/17/18 0431)  ondansetron (ZOFRAN) injection 4 mg (4 mg Intravenous Given 12/17/18 0430)  iopamidol (ISOVUE-300) 61 % injection 100 mL (100 mLs Intravenous Contrast Given 12/17/18 0441)  morphine 4 MG/ML injection 4 mg (4 mg Intravenous Given 12/17/18 0818)  levothyroxine (SYNTHROID, LEVOTHROID) tablet 112 mcg (112 mcg Oral Given 12/17/18 1015)    Mobility walks

## 2018-12-17 NOTE — H&P (Signed)
Thomas Burgess is an 42 y.o. male.   Chief Complaint: Abdominal pain   HPI: Patient is a pleasant 42 year old male who presents to the emergency department with abdominal pain.  He describes the onset over a fairly short period of time yesterday morning now 24 hours ago of sharp constant moderately severe periumbilical pain.  This continued throughout the day yesterday and he presented to the ED.  This has been associated with some occasional mild nausea but no vomiting.  He had one loose bowel movement yesterday.  No fever or chills.  No particular exacerbating or alleviating factors.  The pain has been unchanged over the past 24 hours.  Requiring medication in the ED.  No history of previous similar symptoms or chronic GI complaints.  No urinary symptoms.  Past Medical History:  Diagnosis Date  . Common migraine 09/28/2017  . Gout   . Headache syndrome 09/28/2017   Right occipital  . Hypothyroid   . Migraine     Past Surgical History:  Procedure Laterality Date  . FEMUR SURGERY    . 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 LEG SURGERY     Removed hardware    Family History  Problem Relation Age of Onset  . Congestive Heart Failure Mother   . Diabetes Mother   . Kidney failure Father   . Brain cancer Cousin    Social History:  reports that he has never smoked. He has never used smokeless tobacco. He reports that he does not drink alcohol or use drugs.  Allergies: No Known Allergies  Current Facility-Administered Medications  Medication Dose Route Frequency Provider Last Rate Last Dose  . iopamidol (ISOVUE-300) 61 % injection           . sodium chloride (PF) 0.9 % injection            Current Outpatient Medications  Medication Sig Dispense Refill  . allopurinol (ZYLOPRIM) 100 MG tablet Take 100 mg by mouth at bedtime.     Marland Kitchen. FLUoxetine (PROZAC) 20 MG tablet Take 20 mg by mouth daily.    Marland Kitchen. levothyroxine (SYNTHROID, LEVOTHROID) 112 MCG tablet Take 112 mcg by mouth See  admin instructions. Takes 112 mcg every day, except Wednesday take 224 mcg.    . rizatriptan (MAXALT) 10 MG tablet Take 1 tablet (10 mg total) by mouth 3 (three) times daily as needed for migraine. May repeat in 2 hours if needed 10 tablet 5  . topiramate (TOPAMAX) 100 MG tablet Take 1 tablet (100 mg total) by mouth at bedtime. 90 tablet 3      Results for orders placed or performed during the hospital encounter of 12/17/18 (from the past 48 hour(s))  Lipase, blood     Status: None   Collection Time: 12/16/18  9:59 PM  Result Value Ref Range   Lipase 39 11 - 51 U/L    Comment: Performed at [facility], [address] [place], [place] [address]  Comprehensive metabolic panel     Status: Abnormal   Collection Time: 12/16/18  9:59 PM  Result Value Ref Range   Sodium 139 135 - 145 mmol/L   Potassium 4.0 3.5 - 5.1 mmol/L   Chloride 107 98 - 111 mmol/L   CO2 23 22 - 32 mmol/L   Glucose, Bld 112 (H) 70 - 99 mg/dL   BUN 16 6 - 20 mg/dL   Creatinine, Ser 6.041.22 0.61 - 1.24 mg/dL   Calcium 9.1 8.9 -  10.3 mg/dL   Total Protein 7.4 6.5 - 8.1 g/dL   Albumin 4.4 3.5 - 5.0 g/dL   AST 26 15 - 41 U/L   ALT 32 0 - 44 U/L   Alkaline Phosphatase 78 38 - 126 U/L   Total Bilirubin 0.6 0.3 - 1.2 mg/dL   GFR calc non Af Amer >60 >60 mL/min   GFR calc Af Amer >60 >60 mL/min   Anion gap 9 5 - 15    Comment: Performed at [facility], [address] [place], [place] [address]  CBC     Status: None   Collection Time: 12/16/18  9:59 PM  Result Value Ref Range   WBC 10.0 4.0 - 10.5 K/uL   RBC 4.45 4.22 - 5.81 MIL/uL   Hemoglobin 13.6 13.0 - 17.0 g/dL   HCT 40.941.5 81.139.0 - 91.452.0 %   MCV 93.3 80.0 - 100.0 fL   MCH 30.6 26.0 - 34.0 pg   MCHC 32.8 30.0 - 36.0 g/dL   RDW 78.213.2 95.611.5 - 21.315.5 %   Platelets 222 150 - 400 K/uL   nRBC 0.0 0.0 - 0.2 %    Comment: Performed at [facility], [address] [place], [place] [address]  Urinalysis, Routine w  reflex microscopic     Status: None   Collection Time: 12/17/18  4:07 AM  Result Value Ref Range   Color, Urine YELLOW YELLOW   APPearance CLEAR CLEAR   Specific Gravity, Urine 1.017 1.005 - 1.030   pH 6.0 5.0 - 8.0   Glucose, UA NEGATIVE NEGATIVE mg/dL   Hgb urine dipstick NEGATIVE NEGATIVE   Bilirubin Urine NEGATIVE NEGATIVE   Ketones, ur NEGATIVE NEGATIVE mg/dL   Protein, ur NEGATIVE NEGATIVE mg/dL   Nitrite NEGATIVE NEGATIVE   Leukocytes, UA NEGATIVE NEGATIVE    Comment: Performed at [facility], [address] [place], [place] [address]   Ct Abdomen Pelvis W Contrast  Result Date: 12/17/2018 CLINICAL DATA:  Acute onset of generalized abdominal pain and nausea. EXAM: CT ABDOMEN AND PELVIS WITH CONTRAST TECHNIQUE: Multidetector CT imaging of the abdomen and pelvis was performed using the standard protocol following bolus administration of intravenous contrast. CONTRAST:  100mL ISOVUE-300 IOPAMIDOL (ISOVUE-300) INJECTION 61% COMPARISON:  CT of the abdomen and pelvis performed 05/14/2005 FINDINGS: Lower chest: Minimal atelectasis is noted at the lung bases. The visualized portions of the mediastinum are unremarkable. Hepatobiliary: The liver is unremarkable in appearance. The gallbladder is unremarkable in appearance. The common bile duct remains normal in caliber. Pancreas: The pancreas is within normal limits. Spleen: The spleen is enlarged, measuring 15.0 cm in length. Adrenals/Urinary Tract: The adrenal glands are unremarkable in appearance. The kidneys are within normal limits. There is no evidence of hydronephrosis. No renal or ureteral stones are identified. No perinephric stranding is seen. Stomach/Bowel: The stomach is unremarkable in appearance. The small bowel is within normal limits. Trace inflammation is noted about the appendix, though it remains normal in caliber. There is no definite evidence of appendicitis. The colon is unremarkable in appearance.  Vascular/Lymphatic: The abdominal aorta is unremarkable in appearance. The inferior vena cava is grossly unremarkable. No retroperitoneal lymphadenopathy is seen. No pelvic sidewall lymphadenopathy is identified. Reproductive: The bladder is mildly distended and grossly unremarkable. The prostate is normal in size. Other: No additional soft tissue abnormalities are seen. Musculoskeletal: No acute osseous abnormalities are identified. The visualized musculature is unremarkable in appearance. IMPRESSION: 1. No acute abnormality seen within the  abdomen or pelvis. 2. Splenomegaly. Electronically Signed   By: Roanna Raider M.D.   On: 12/17/2018 05:04    Review of Systems  Constitutional: Negative for chills, fever and malaise/fatigue.  Respiratory: Negative.   Cardiovascular: Negative.   Gastrointestinal: Positive for abdominal pain and nausea. Negative for blood in stool, constipation, diarrhea and vomiting.  Genitourinary: Negative.     Blood pressure 124/77, pulse 66, temperature 97.7 F (36.5 C), temperature source Oral, resp. rate 18, SpO2 99 %. Physical Exam  General: Alert, well-developed Caucasian male, in no distress Skin: Warm and dry without rash or infection. HEENT: No palpable masses or thyromegaly. Sclera nonicteric. Pupils equal round and reactive. . Lymph nodes: No cervical, supraclavicular, or inguinal nodes palpable. Lungs: Breath sounds clear and equal without increased work of breathing Cardiovascular: Regular rate and rhythm without murmur. No JVD or edema. Peripheral pulses intact. Abdomen: Active to hyperactive bowel sounds.  Nondistended.  Moderate right lower quadrant and milder right upper quadrant and suprapubic tenderness with voluntary guarding.  No masses palpable. No organomegaly. No palpable hernias. Extremities: No edema or joint swelling or deformity. No chronic venous stasis changes. Neurologic: Alert and fully oriented.  Affect normal.  No gross motor  deficits.  Assessment/Plan 24 hours of periumbilical pain associated with slight nausea.  Exam shows localized right lower quadrant tenderness consistent with appendicitis although he has hyperactive bowel sounds.  I have personally reviewed his CT scan.  There may be some minimal stranding around the appendix but this is very subtle and bottom line on his CT report was normal.  No leukocytosis or fever. Cannot rule out early acute appendicitis although I suspect he may have a viral enteritis.  He is requiring IV pain medicine in the emergency department.  I have recommended admission for observation for 24 hours.  Repeat CBC tomorrow morning.  If his symptoms are not resolving or any evidence of infection then I would recommend proceeding with laparoscopic appendectomy at that time.  This was discussed with the patient and his wife and they understand and all questions answered and agree with the plan.  Mariella Saa, MD 12/17/2018, 8:19 AM

## 2018-12-18 ENCOUNTER — Encounter (HOSPITAL_COMMUNITY): Payer: Self-pay | Admitting: Certified Registered Nurse Anesthetist

## 2018-12-18 ENCOUNTER — Encounter (HOSPITAL_COMMUNITY)
Admission: EM | Disposition: A | Discharge status: Home / Self Care | Payer: Self-pay | Source: Home / Self Care | Attending: Emergency Medicine

## 2018-12-18 ENCOUNTER — Observation Stay (HOSPITAL_COMMUNITY): Payer: BLUE CROSS/BLUE SHIELD | Admitting: Certified Registered Nurse Anesthetist

## 2018-12-18 HISTORY — PX: LAPAROSCOPIC APPENDECTOMY: SHX408

## 2018-12-18 LAB — CBC
HCT: 40.8 % (ref 39.0–52.0)
Hemoglobin: 12.8 g/dL — ABNORMAL LOW (ref 13.0–17.0)
MCH: 29.6 pg (ref 26.0–34.0)
MCHC: 31.4 g/dL (ref 30.0–36.0)
MCV: 94.2 fL (ref 80.0–100.0)
PLATELETS: 172 10*3/uL (ref 150–400)
RBC: 4.33 MIL/uL (ref 4.22–5.81)
RDW: 13.2 % (ref 11.5–15.5)
WBC: 5.8 10*3/uL (ref 4.0–10.5)
nRBC: 0 % (ref 0.0–0.2)

## 2018-12-18 LAB — HIV ANTIBODY (ROUTINE TESTING W REFLEX): HIV Screen 4th Generation wRfx: NONREACTIVE

## 2018-12-18 LAB — SURGICAL PCR SCREEN
MRSA, PCR: NEGATIVE
Staphylococcus aureus: POSITIVE — AB

## 2018-12-18 SURGERY — APPENDECTOMY, LAPAROSCOPIC
Anesthesia: General

## 2018-12-18 MED ORDER — FENTANYL CITRATE (PF) 100 MCG/2ML IJ SOLN
INTRAMUSCULAR | Status: DC | PRN
  Administered 2018-12-18: 100 ug via INTRAVENOUS

## 2018-12-18 MED ORDER — PROPOFOL 10 MG/ML IV BOLUS
INTRAVENOUS | Status: AC
  Filled 2018-12-18: qty 20

## 2018-12-18 MED ORDER — BUPIVACAINE-EPINEPHRINE 0.25% -1:200000 IJ SOLN
INTRAMUSCULAR | Status: DC | PRN
  Administered 2018-12-18: 10 mL

## 2018-12-18 MED ORDER — OXYCODONE HCL 5 MG PO TABS: 5.0000 mg | ORAL_TABLET | Freq: Once | ORAL | Status: DC | PRN

## 2018-12-18 MED ORDER — PIPERACILLIN-TAZOBACTAM 3.375 G IVPB 30 MIN
3.3750 g | Freq: Once | INTRAVENOUS | Status: AC
  Administered 2018-12-18: 3.375 g via INTRAVENOUS
  Filled 2018-12-18: qty 50

## 2018-12-18 MED ORDER — FENTANYL CITRATE (PF) 100 MCG/2ML IJ SOLN
25.0000 ug | INTRAMUSCULAR | Status: DC | PRN
  Administered 2018-12-18 (×3): 50 ug via INTRAVENOUS

## 2018-12-18 MED ORDER — POTASSIUM CHLORIDE IN NACL 20-0.9 MEQ/L-% IV SOLN
INTRAVENOUS | Status: DC
  Administered 2018-12-18: 17:00:00 via INTRAVENOUS
  Filled 2018-12-18 (×2): qty 1000

## 2018-12-18 MED ORDER — ROCURONIUM BROMIDE 10 MG/ML (PF) SYRINGE
PREFILLED_SYRINGE | INTRAVENOUS | Status: AC
  Filled 2018-12-18: qty 10

## 2018-12-18 MED ORDER — ENOXAPARIN SODIUM 40 MG/0.4ML ~~LOC~~ SOLN
40.0000 mg | Freq: Every day | SUBCUTANEOUS | Status: DC
  Administered 2018-12-19: 40 mg via SUBCUTANEOUS
  Filled 2018-12-18: qty 0.4

## 2018-12-18 MED ORDER — DEXAMETHASONE SODIUM PHOSPHATE 10 MG/ML IJ SOLN
INTRAMUSCULAR | Status: DC | PRN
  Administered 2018-12-18: 10 mg via INTRAVENOUS

## 2018-12-18 MED ORDER — IBUPROFEN 200 MG PO TABS
600.0000 mg | ORAL_TABLET | Freq: Three times a day (TID) | ORAL | Status: DC
  Administered 2018-12-18 – 2018-12-19 (×3): 600 mg via ORAL
  Filled 2018-12-18 (×3): qty 3

## 2018-12-18 MED ORDER — PROMETHAZINE HCL 25 MG/ML IJ SOLN: 6.2500 mg | INTRAMUSCULAR | Status: DC | PRN

## 2018-12-18 MED ORDER — ONDANSETRON HCL 4 MG/2ML IJ SOLN
INTRAMUSCULAR | Status: DC | PRN
  Administered 2018-12-18: 4 mg via INTRAVENOUS

## 2018-12-18 MED ORDER — SUGAMMADEX SODIUM 200 MG/2ML IV SOLN
INTRAVENOUS | Status: AC
  Filled 2018-12-18: qty 2

## 2018-12-18 MED ORDER — LACTATED RINGERS IV SOLN
INTRAVENOUS | Status: DC
  Administered 2018-12-18 (×2): via INTRAVENOUS

## 2018-12-18 MED ORDER — ACETAMINOPHEN 10 MG/ML IV SOLN
1000.0000 mg | Freq: Once | INTRAVENOUS | Status: DC | PRN
  Administered 2018-12-18: 1000 mg via INTRAVENOUS

## 2018-12-18 MED ORDER — PROPOFOL 10 MG/ML IV BOLUS
INTRAVENOUS | Status: DC | PRN
  Administered 2018-12-18: 200 mg via INTRAVENOUS

## 2018-12-18 MED ORDER — OXYCODONE HCL 5 MG/5ML PO SOLN
5.0000 mg | Freq: Once | ORAL | Status: DC | PRN
  Filled 2018-12-18: qty 5

## 2018-12-18 MED ORDER — MORPHINE SULFATE (PF) 4 MG/ML IV SOLN
1.0000 mg | INTRAVENOUS | Status: DC | PRN
  Administered 2018-12-19: 2 mg via INTRAVENOUS
  Filled 2018-12-18: qty 1

## 2018-12-18 MED ORDER — EPHEDRINE SULFATE-NACL 50-0.9 MG/10ML-% IV SOSY
PREFILLED_SYRINGE | INTRAVENOUS | Status: DC | PRN
  Administered 2018-12-18: 10 mg via INTRAVENOUS

## 2018-12-18 MED ORDER — LIDOCAINE HCL (PF) 1 % IJ SOLN
INTRAMUSCULAR | Status: DC | PRN
  Administered 2018-12-18: 10 mL

## 2018-12-18 MED ORDER — ACETAMINOPHEN 10 MG/ML IV SOLN
INTRAVENOUS | Status: AC
  Filled 2018-12-18: qty 100

## 2018-12-18 MED ORDER — DEXAMETHASONE SODIUM PHOSPHATE 10 MG/ML IJ SOLN
INTRAMUSCULAR | Status: AC
  Filled 2018-12-18: qty 1

## 2018-12-18 MED ORDER — FENTANYL CITRATE (PF) 100 MCG/2ML IJ SOLN
INTRAMUSCULAR | Status: AC
  Filled 2018-12-18: qty 2

## 2018-12-18 MED ORDER — ROCURONIUM BROMIDE 10 MG/ML (PF) SYRINGE
PREFILLED_SYRINGE | INTRAVENOUS | Status: DC | PRN
  Administered 2018-12-18: 50 mg via INTRAVENOUS

## 2018-12-18 MED ORDER — OXYCODONE HCL 5 MG PO TABS
5.0000 mg | ORAL_TABLET | ORAL | Status: DC | PRN
  Administered 2018-12-18 – 2018-12-19 (×3): 10 mg via ORAL
  Filled 2018-12-18 (×3): qty 2

## 2018-12-18 MED ORDER — LIDOCAINE 2% (20 MG/ML) 5 ML SYRINGE
INTRAMUSCULAR | Status: DC | PRN
  Administered 2018-12-18: 50 mg via INTRAVENOUS

## 2018-12-18 MED ORDER — ACETAMINOPHEN 500 MG PO TABS
1000.0000 mg | ORAL_TABLET | Freq: Four times a day (QID) | ORAL | Status: DC
  Administered 2018-12-18 – 2018-12-19 (×3): 1000 mg via ORAL
  Filled 2018-12-18 (×3): qty 2

## 2018-12-18 MED ORDER — SUGAMMADEX SODIUM 200 MG/2ML IV SOLN
INTRAVENOUS | Status: DC | PRN
  Administered 2018-12-18: 200 mg via INTRAVENOUS

## 2018-12-18 MED ORDER — BUPIVACAINE-EPINEPHRINE (PF) 0.25% -1:200000 IJ SOLN
INTRAMUSCULAR | Status: AC
  Filled 2018-12-18: qty 30

## 2018-12-18 MED ORDER — LIDOCAINE 2% (20 MG/ML) 5 ML SYRINGE
INTRAMUSCULAR | Status: AC
  Filled 2018-12-18: qty 5

## 2018-12-18 MED ORDER — LACTATED RINGERS IR SOLN
Status: DC | PRN
  Administered 2018-12-18: 1000 mL

## 2018-12-18 MED ORDER — MIDAZOLAM HCL 2 MG/2ML IJ SOLN
INTRAMUSCULAR | Status: AC
  Filled 2018-12-18: qty 2

## 2018-12-18 MED ORDER — 0.9 % SODIUM CHLORIDE (POUR BTL) OPTIME
TOPICAL | Status: DC | PRN
  Administered 2018-12-18: 1000 mL

## 2018-12-18 MED ORDER — MIDAZOLAM HCL 5 MG/5ML IJ SOLN
INTRAMUSCULAR | Status: DC | PRN
  Administered 2018-12-18: 2 mg via INTRAVENOUS

## 2018-12-18 MED ORDER — LIDOCAINE HCL (PF) 1 % IJ SOLN
INTRAMUSCULAR | Status: AC
  Filled 2018-12-18: qty 30

## 2018-12-18 MED ORDER — ONDANSETRON HCL 4 MG/2ML IJ SOLN
INTRAMUSCULAR | Status: AC
  Filled 2018-12-18: qty 2

## 2018-12-18 MED ORDER — PIPERACILLIN-TAZOBACTAM 3.375 G IVPB 30 MIN
3.3750 g | Freq: Once | INTRAVENOUS | Status: AC
  Administered 2018-12-18: 3.375 g via INTRAVENOUS
  Filled 2018-12-18 (×2): qty 50

## 2018-12-18 SURGICAL SUPPLY — 46 items
ADH SKN CLS APL DERMABOND .7 (GAUZE/BANDAGES/DRESSINGS) ×1
APPLIER CLIP ROT 10 11.4 M/L (STAPLE)
APR CLP MED LRG 11.4X10 (STAPLE)
BAG SPEC RTRVL 10 TROC 200 (ENDOMECHANICALS) ×1
BAG SPEC RTRVL LRG 6X4 10 (ENDOMECHANICALS)
CABLE HIGH FREQUENCY MONO STRZ (ELECTRODE) ×3 IMPLANT
CLIP APPLIE ROT 10 11.4 M/L (STAPLE) IMPLANT
COVER SURGICAL LIGHT HANDLE (MISCELLANEOUS) ×3 IMPLANT
COVER WAND RF STERILE (DRAPES) ×2 IMPLANT
CUTTER FLEX LINEAR 45M (STAPLE) ×2 IMPLANT
DECANTER SPIKE VIAL GLASS SM (MISCELLANEOUS) ×3 IMPLANT
DERMABOND ADVANCED (GAUZE/BANDAGES/DRESSINGS) ×2
DERMABOND ADVANCED .7 DNX12 (GAUZE/BANDAGES/DRESSINGS) ×1 IMPLANT
DRAPE LAPAROSCOPIC ABDOMINAL (DRAPES) ×3 IMPLANT
ELECT REM PT RETURN 15FT ADLT (MISCELLANEOUS) ×3 IMPLANT
ENDOLOOP SUT PDS II  0 18 (SUTURE)
ENDOLOOP SUT PDS II 0 18 (SUTURE) IMPLANT
GLOVE BIO SURGEON STRL SZ 6 (GLOVE) ×3 IMPLANT
GLOVE BIOGEL PI IND STRL 6.5 (GLOVE) IMPLANT
GLOVE BIOGEL PI IND STRL 7.0 (GLOVE) IMPLANT
GLOVE BIOGEL PI INDICATOR 6.5 (GLOVE) ×2
GLOVE BIOGEL PI INDICATOR 7.0 (GLOVE) ×6
GLOVE INDICATOR 6.5 STRL GRN (GLOVE) ×3 IMPLANT
GLOVE SURG SS PI 6.0 STRL IVOR (GLOVE) ×2 IMPLANT
GOWN STRL REUS W/TWL 2XL LVL3 (GOWN DISPOSABLE) ×3 IMPLANT
GOWN STRL REUS W/TWL LRG LVL3 (GOWN DISPOSABLE) ×4 IMPLANT
GOWN STRL REUS W/TWL XL LVL3 (GOWN DISPOSABLE) ×1 IMPLANT
KIT BASIN OR (CUSTOM PROCEDURE TRAY) ×3 IMPLANT
POUCH RETRIEVAL ECOSAC 10 (ENDOMECHANICALS) IMPLANT
POUCH RETRIEVAL ECOSAC 10MM (ENDOMECHANICALS) ×2
POUCH SPECIMEN RETRIEVAL 10MM (ENDOMECHANICALS) ×1 IMPLANT
RELOAD 45 VASCULAR/THIN (ENDOMECHANICALS) IMPLANT
RELOAD STAPLE 45 2.5 WHT GRN (ENDOMECHANICALS) IMPLANT
RELOAD STAPLE 45 3.5 BLU ETS (ENDOMECHANICALS) IMPLANT
RELOAD STAPLE TA45 3.5 REG BLU (ENDOMECHANICALS) ×3 IMPLANT
SCISSORS LAP 5X35 DISP (ENDOMECHANICALS) ×3 IMPLANT
SET IRRIG TUBING LAPAROSCOPIC (IRRIGATION / IRRIGATOR) ×3 IMPLANT
SHEARS HARMONIC ACE PLUS 36CM (ENDOMECHANICALS) ×3 IMPLANT
SLEEVE XCEL OPT CAN 5 100 (ENDOMECHANICALS) ×3 IMPLANT
SUT MNCRL AB 4-0 PS2 18 (SUTURE) ×3 IMPLANT
TOWEL OR 17X26 10 PK STRL BLUE (TOWEL DISPOSABLE) ×3 IMPLANT
TRAY FOLEY MTR SLVR 16FR STAT (SET/KITS/TRAYS/PACK) ×2 IMPLANT
TRAY LAPAROSCOPIC (CUSTOM PROCEDURE TRAY) ×3 IMPLANT
TROCAR BLADELESS OPT 5 100 (ENDOMECHANICALS) ×3 IMPLANT
TROCAR XCEL BLUNT TIP 100MML (ENDOMECHANICALS) ×3 IMPLANT
TUBING INSUF HEATED (TUBING) ×3 IMPLANT

## 2018-12-18 NOTE — Anesthesia Procedure Notes (Signed)
Procedure Name: Intubation Date/Time: 12/18/2018 2:20 PM Performed by: [place] Lorence Nagengast C, CRNA Pre-anesthesia Checklist: Patient identified, Emergency Drugs available, Suction available and Patient being monitored Patient Re-evaluated:Patient Re-evaluated prior to induction Oxygen Delivery Method: Circle system utilized Preoxygenation: Pre-oxygenation with 100% oxygen Induction Type: IV induction Ventilation: Mask ventilation without difficulty Laryngoscope Size: Mac and 4 Grade View: Grade I Tube type: Oral Tube size: 7.5 mm Number of attempts: 1 Airway Equipment and Method: Stylet and Oral airway Placement Confirmation: ETT inserted through vocal cords under direct vision,  positive ETCO2 and breath sounds checked- equal and bilateral Tube secured with: Tape Dental Injury: Teeth and Oropharynx as per pre-operative assessment

## 2018-12-18 NOTE — Anesthesia Preprocedure Evaluation (Addendum)
Anesthesia Evaluation  Patient identified by MRN, date of birth, ID band Patient awake    Reviewed: Allergy & Precautions, NPO status , Patient's Chart, lab work & pertinent test results  History of Anesthesia Complications Negative for: history of anesthetic complications  Airway Mallampati: II  TM Distance: >3 FB Neck ROM: Full    Dental  (+) Teeth Intact, Dental Advisory Given   Pulmonary neg pulmonary ROS,    Pulmonary exam normal breath sounds clear to auscultation       Cardiovascular negative cardio ROS Normal cardiovascular exam Rhythm:Regular Rate:Normal     Neuro/Psych  Headaches,    GI/Hepatic negative GI ROS, Neg liver ROS,   Endo/Other  Hypothyroidism   Renal/GU negative Renal ROS     Musculoskeletal negative musculoskeletal ROS (+)   Abdominal   Peds  Hematology negative hematology ROS (+)   Anesthesia Other Findings Day of surgery medications reviewed with the patient.  Reproductive/Obstetrics                            Anesthesia Physical Anesthesia Plan  ASA: II  Anesthesia Plan: General   Post-op Pain Management:    Induction: Intravenous  PONV Risk Score and Plan: 2 and Treatment may vary due to age or medical condition, Ondansetron, Dexamethasone and Midazolam  Airway Management Planned: Oral ETT  Additional Equipment:   Intra-op Plan:   Post-operative Plan: Extubation in OR  Informed Consent: I have reviewed the patients History and Physical, chart, labs and discussed the procedure including the risks, benefits and alternatives for the proposed anesthesia with the patient or authorized representative who has indicated his/her understanding and acceptance.   Dental advisory given  Plan Discussed with: CRNA  Anesthesia Plan Comments:        Anesthesia Quick Evaluation

## 2018-12-18 NOTE — Progress Notes (Signed)
Subjective/Chief Complaint: Pt feeling worse and pain more difficult to control.     Objective: Vital signs in last 24 hours: Temp:  [97.5 F (36.4 C)-97.9 F (36.6 C)] 97.5 F (36.4 C) (12/30 0540) Pulse Rate:  [53-62] 62 (12/30 0540) Resp:  [16-18] 16 (12/30 0540) BP: (113-142)/(69-83) 142/76 (12/30 0540) SpO2:  [98 %-100 %] 98 % (12/30 0540) Weight:  [99.3 kg] 99.3 kg (12/29 1100) Last BM Date: 12/16/18  Intake/Output from previous day: 12/29 0701 - 12/30 0700 In: 1133.4 [P.O.:1080; I.V.:53.4] Out: 3000 [Urine:3000] Intake/Output this shift: No intake/output data recorded.  General appearance: alert, cooperative and moderate distress Resp: breathing comfortably GI: soft, sl distended, significant tenderness RLQ Extremities: extremities normal, atraumatic, no cyanosis or edema  Lab Results:  Recent Labs    12/16/18 2159 12/18/18 0403  WBC 10.0 5.8  HGB 13.6 12.8*  HCT 41.5 40.8  PLT 222 172   BMET Recent Labs    12/16/18 2159  NA 139  K 4.0  CL 107  CO2 23  GLUCOSE 112*  BUN 16  CREATININE 1.22  CALCIUM 9.1   PT/INR No results for input(s): LABPROT, INR in the last 72 hours. ABG No results for input(s): PHART, HCO3 in the last 72 hours.  Invalid input(s): PCO2, PO2  Studies/Results: Ct Abdomen Pelvis W Contrast  Result Date: 12/17/2018 CLINICAL DATA:  Acute onset of generalized abdominal pain and nausea. EXAM: CT ABDOMEN AND PELVIS WITH CONTRAST TECHNIQUE: Multidetector CT imaging of the abdomen and pelvis was performed using the standard protocol following bolus administration of intravenous contrast. CONTRAST:  100mL ISOVUE-300 IOPAMIDOL (ISOVUE-300) INJECTION 61% COMPARISON:  CT of the abdomen and pelvis performed 05/14/2005 FINDINGS: Lower chest: Minimal atelectasis is noted at the lung bases. The visualized portions of the mediastinum are unremarkable. Hepatobiliary: The liver is unremarkable in appearance. The gallbladder is unremarkable in  appearance. The common bile duct remains normal in caliber. Pancreas: The pancreas is within normal limits. Spleen: The spleen is enlarged, measuring 15.0 cm in length. Adrenals/Urinary Tract: The adrenal glands are unremarkable in appearance. The kidneys are within normal limits. There is no evidence of hydronephrosis. No renal or ureteral stones are identified. No perinephric stranding is seen. Stomach/Bowel: The stomach is unremarkable in appearance. The small bowel is within normal limits. Trace inflammation is noted about the appendix, though it remains normal in caliber. There is no definite evidence of appendicitis. The colon is unremarkable in appearance. Vascular/Lymphatic: The abdominal aorta is unremarkable in appearance. The inferior vena cava is grossly unremarkable. No retroperitoneal lymphadenopathy is seen. No pelvic sidewall lymphadenopathy is identified. Reproductive: The bladder is mildly distended and grossly unremarkable. The prostate is normal in size. Other: No additional soft tissue abnormalities are seen. Musculoskeletal: No acute osseous abnormalities are identified. The visualized musculature is unremarkable in appearance. IMPRESSION: 1. No acute abnormality seen within the abdomen or pelvis. 2. Splenomegaly. Electronically Signed   By: Roanna RaiderJeffery  Chang M.D.   On: 12/17/2018 05:04    Anti-infectives: Anti-infectives (From admission, onward)   Start     Dose/Rate Route Frequency Ordered Stop   12/18/18 1030  piperacillin-tazobactam (ZOSYN) IVPB 3.375 g     3.375 g 100 mL/hr over 30 Minutes Intravenous  Once 12/18/18 1027        Assessment/Plan:  Abdominal pain, slightly abnormal CT scan. Clinically acute appendicitis  Plan lap appy. NPO IV fluids IV antibiotics Appendectomy was described to the patient.  The incisions and surgical technique were explained.  The patient  was advised that some of the hair on the abdomen would be clipped, and that a foley catheter would be  placed.  I advised the patient of the risks of surgery including, but not limited to, bleeding, infection, damage to other structures, risk of an open operation, risk of abscess, and risk of blood clot.  The recovery was also described to the patient.  He was advised that he will have lifting restrictions for 2 weeks.      LOS: 0 days    Almond LintFaera Tashon Capp 12/18/2018

## 2018-12-18 NOTE — Transfer of Care (Signed)
Immediate Anesthesia Transfer of Care Note  Patient: Thomas Burgess  Procedure(s) Performed: APPENDECTOMY LAPAROSCOPIC (N/A )  Patient Location: PACU  Anesthesia Type:General  Level of Consciousness: drowsy  Airway & Oxygen Therapy: Patient Spontanous Breathing and Patient connected to face mask oxygen  Post-op Assessment: Report given to RN, Post -op Vital signs reviewed and stable and Patient moving all extremities  Post vital signs: Reviewed and stable  Last Vitals:  Vitals Value Taken Time  BP 134/84 12/18/2018  3:23 PM  Temp    Pulse 60 12/18/2018  3:26 PM  Resp 19 12/18/2018  3:26 PM  SpO2 100 % 12/18/2018  3:26 PM  Vitals shown include unvalidated device data.  Last Pain:  Vitals:   12/18/18 1217  TempSrc: Oral  PainSc: 5       Patients Stated Pain Goal: 5 (12/18/18 1217)  Complications: No apparent anesthesia complications

## 2018-12-18 NOTE — Op Note (Signed)
Appendectomy, Lap, Procedure Note  Indications: The patient presented with a history of right-sided abdominal pain. A CT appeared nearly normal with the exception of trace inflammation around the appendix, but the patient had a classic story for appendicitis with RLQ tenderness.  He was admitted for observation. The next morning, he had worsening of pain and increased tenderness.  We proceeded with operative therapy  Pre-operative Diagnosis: Acute appendicitis  Post-operative Diagnosis: Same  Surgeon: Almond LintFaera Burnice Vassel   Assistants: Barnetta ChapelKelly Osborne, PA-C  Anesthesia: General endotracheal anesthesia and Local anesthesia 1% plain lidocaine, 0.25.% bupivacaine, with epinephrine  ASA Class: 2  Procedure Details  The patient was seen again in the Holding Room. The risks, benefits, complications, treatment options, and expected outcomes were discussed with the patient and/or family. The possibilities of perforation of viscus, bleeding, recurrent infection, the need for additional procedures, failure to diagnose a condition, and creating a complication requiring transfusion or operation were discussed. There was concurrence with the proposed plan and informed consent was obtained. The site of surgery was properly noted. The patient was taken to Operating Room, identified as Katha HammingPaul L Mcpeek and the procedure verified as Appendectomy. A Time Out was held and the above information confirmed.  The patient was placed in the supine position and general anesthesia was induced, along with placement of orogastric tube, Venodyne boots, and a Foley catheter. The abdomen was prepped and draped in a sterile fashion. Local anesthetic was infiltrated in the infraumbilical region.  A 1.5 cm vertical incision was made just below the umbilicus.  The Kelly clamp was used to spread the subcutaneous tissues.  The fascia was elevated with 2 Kocher clamps and incised with the #11 blade.  A Tresa EndoKelly was used to confirm entrance into the  peritoneal cavity.  A pursestring suture was placed around the fascial incision.  The Hasson trocar was inserted into the abdomen and held in place with the tails of the suture.  The pneumoperitoneum was then established to steady pressure of 15 mmHg.     Additional 5 mm cannulas then placed in the left lower quadrant of the abdomen and the suprapubic region under direct visualization.  A careful evaluation of the entire abdomen was carried out. The patient was placed in Trendelenburg and rotated to the left.  The small intestines were retracted in the cephalad and left lateral direction away from the pelvis and right lower quadrant. The patient was found to have an enlarged and inflamed appendix. There was no evidence of perforation. The small bowel attached laterally over the appendiceal orifice.  Careful sharp dissection was used to take down these attachments.    The appendix was carefully dissected. The appendix was was skeletonized with the harmonic scalpel.   The appendix was divided at its base using an endo-GIA stapler. Minimal appendiceal stump was left in place. The appendix was removed from the abdomen with an Endocatch bag through the umbilical port.  There was no evidence of bleeding, leakage, or complication after division of the appendix. Irrigation was also performed and irrigate suctioned from the abdomen as well.  The 5 mm trocars were removed.  The pneumoperitoneum was evacuated from the abdomen.    The trocar site skin wounds were closed with 4-0 Monocryl and dressed with Dermabond.  Instrument, sponge, and needle counts were correct at the conclusion of the case.   Findings: The appendix was found to be inflamed. There were not signs of necrosis.  There was not perforation. There was not abscess formation.  Estimated Blood Loss:  Minimal                Specimens: appendix to pathology         Complications:  None; patient tolerated the procedure well.         Disposition:  PACU - hemodynamically stable.         Condition: stable

## 2018-12-19 ENCOUNTER — Encounter (HOSPITAL_COMMUNITY): Payer: Self-pay | Admitting: General Surgery

## 2018-12-19 MED ORDER — OXYCODONE HCL 5 MG PO TABS: 5.0000 mg | ORAL_TABLET | ORAL | 0 refills | Status: DC | PRN

## 2018-12-19 NOTE — Anesthesia Postprocedure Evaluation (Signed)
Anesthesia Post Note  Patient: Thomas Burgess  Procedure(s) Performed: APPENDECTOMY LAPAROSCOPIC (N/A )     Anesthesia Post Evaluation  Last Vitals:  Vitals:   12/19/18 0139 12/19/18 0547  BP: 124/60 121/70  Pulse: 77 70  Resp: 17 14  Temp: 36.4 C 36.6 C  SpO2: 95% 97%    Last Pain:  Vitals:   12/19/18 1420  TempSrc:   PainSc: 5    Pain Goal: Patients Stated Pain Goal: 4 (12/19/18 1010)               Kaylyn LayerKathryn E Thierry Dobosz

## 2018-12-19 NOTE — Discharge Instructions (Signed)
[facility] ° °Please arrive at least 30 min before your appointment to complete your check in paperwork.  If you are unable to arrive 30 min prior to your appointment time we may have to cancel or reschedule you. °LAPAROSCOPIC SURGERY: POST OP INSTRUCTIONS °Always review your discharge instruction sheet given to you by the facility where your surgery was performed. °IF YOU HAVE DISABILITY OR FAMILY LEAVE FORMS, YOU MUST BRING THEM TO THE OFFICE FOR PROCESSING.   °DO NOT GIVE THEM TO YOUR DOCTOR. ° °PAIN CONTROL ° °1. First take acetaminophen (Tylenol) AND/or ibuprofen (Advil) to control your pain after surgery.  Follow directions on package.  Taking acetaminophen (Tylenol) and/or ibuprofen (Advil) regularly after surgery will help to control your pain and lower the amount of prescription pain medication you may need.  You should not take more than 4,000 mg (4 grams) of acetaminophen (Tylenol) in 24 hours.  You should not take ibuprofen (Advil), aleve, motrin, naprosyn or other NSAIDS if you have a history of stomach ulcers or chronic kidney disease.  °2. A prescription for pain medication may be given to you upon discharge.  Take your pain medication as prescribed, if you still have uncontrolled pain after taking acetaminophen (Tylenol) or ibuprofen (Advil). °3. Use ice packs to help control pain. °4. If you need a refill on your pain medication, please contact your pharmacy.  They will contact our office to request authorization. Prescriptions will not be filled after 5pm or on week-ends. ° °HOME MEDICATIONS °5. Take your usually prescribed medications unless otherwise directed. ° °DIET °6. You should follow a light diet the first few days after arrival home.  Be sure to include lots of fluids daily. Avoid fatty, fried foods.  ° °CONSTIPATION °7. It is common to experience some constipation after surgery and if you are taking pain medication.  Increasing fluid intake and taking a stool  softener (such as Colace) will usually help or prevent this problem from occurring.  A mild laxative (Milk of Magnesia or Miralax) should be taken according to package instructions if there are no bowel movements after 48 hours. ° °WOUND/INCISION CARE °8. Most patients will experience some swelling and bruising in the area of the incisions.  Ice packs will help.  Swelling and bruising can take several days to resolve.  °9. Unless discharge instructions indicate otherwise, follow guidelines below  °a. STERI-STRIPS - you may remove your outer bandages 48 hours after surgery, and you may shower at that time.  You have steri-strips (small skin tapes) in place directly over the incision.  These strips should be left on the skin for 7-10 days.   °b. DERMABOND/SKIN GLUE - you may shower in 24 hours.  The glue will flake off over the next 2-3 weeks. °10. Any sutures or staples will be removed at the office during your follow-up visit. ° °ACTIVITIES °11. You may resume regular (light) daily activities beginning the next day--such as daily self-care, walking, climbing stairs--gradually increasing activities as tolerated.  You may have sexual intercourse when it is comfortable.  Refrain from any heavy lifting or straining until approved by your doctor. °a. You may drive when you are no longer taking prescription pain medication, you can comfortably wear a seatbelt, and you can safely maneuver your car and apply brakes. ° °FOLLOW-UP °12. You should see your doctor in the office for a follow-up appointment approximately 2-3 weeks after your surgery.  You should have been given your post-op/follow-up appointment when   your surgery was scheduled.  If you did not receive a post-op/follow-up appointment, make sure that you call for this appointment within a day or two after you arrive home to insure a convenient appointment time. ° ° °WHEN TO CALL YOUR DOCTOR: °1. Fever over 101.0 °2. Inability to urinate °3. Continued bleeding from  incision. °4. Increased pain, redness, or drainage from the incision. °5. Increasing abdominal pain ° °The clinic staff is available to answer your questions during regular business hours.  Please don’t hesitate to call and ask to speak to one of the nurses for clinical concerns.  If you have a medical emergency, go to the nearest emergency room or call 911.  A surgeon from [facility] is always on call at the hospital. °[address], [address], [place], [place]  [address] ? [address] [address], [place], [place]   [address] °[phone] ? [phone] ? FAX [phone] ° °

## 2018-12-21 NOTE — Discharge Summary (Signed)
Physician Discharge Summary  Patient ID: Thomas Burgess MRN: [id number] DOB/AGE: 44-Dec-1977 44 y.o.  Admit date: 12/17/2018 Discharge date: 12/21/2018  Admission Diagnoses: Patient Active Problem List   Diagnosis Date Noted  . Periumbilical abdominal pain 12/17/2018  . Headache syndrome 09/28/2017  . Common migraine 09/28/2017  . Primary localized osteoarthritis of left knee 09/19/2017    Discharge Diagnoses:  Acute appendicitis  and same as above  Discharged Condition: stable  Hospital Course:  Pt was admitted to the hospital 12/17/2018 with significant RLQ abdominal pain and story c/w acute appendicitis, but nearly normal CT, no fever, and normal white count.  He was kept on IV fluids and was NPO.  Over the next 24 hours, his pain worsened.  He was taken to the OR on 12/30 for a laparoscopic appendectomy.  This showed mild acute appendicitis of the distal half of the appendix without perforation or purulence.    Post op, his diet was advanced.  His incisions were sore, the the severe pain improved.  He tolerated oral pain medication with good control of discomfort.  He was ambulatory.  He was discharged in stable condition.    He did have some dehiscence of his dermabond on POD 0-1.  The umbilical wound was covered with 2x2 and small tegaderm.      Consults: None  Significant Diagnostic Studies: labs: WBCs prior to d/c were 5.8.  Cr 1.22  Treatments: surgery: see above  Discharge Exam: Blood pressure 121/70, pulse 70, temperature 97.8 F (36.6 C), temperature source Oral, resp. rate 14, height 6\' 2"  (1.88 m), weight 99.3 kg, SpO2 97 %. General appearance: alert, cooperative and no distress Resp: breathing comfortably GI: soft, mildly distended, approp tender at incisions.  dressing at umbilicus c/d/i. Extremities: extremities normal, atraumatic, no cyanosis or edema  Disposition:   Discharge Instructions    Call MD for:  difficulty breathing, headache or visual  disturbances   Complete by:  As directed    Call MD for:  persistant dizziness or light-headedness   Complete by:  As directed    Call MD for:  persistant nausea and vomiting   Complete by:  As directed    Call MD for:  redness, tenderness, or signs of infection (pain, swelling, redness, odor or green/yellow discharge around incision site)   Complete by:  As directed    Call MD for:  severe uncontrolled pain   Complete by:  As directed    Call MD for:  temperature >100.4   Complete by:  As directed    Diet - low sodium heart healthy   Complete by:  As directed    Increase activity slowly   Complete by:  As directed      Allergies as of 12/19/2018   No Known Allergies     Medication List    TAKE these medications   allopurinol 100 MG tablet Commonly known as:  ZYLOPRIM Take 100 mg by mouth at bedtime.   FLUoxetine 20 MG tablet Commonly known as:  PROZAC Take 20 mg by mouth daily.   levothyroxine 112 MCG tablet Commonly known as:  SYNTHROID, LEVOTHROID Take 112 mcg by mouth See admin instructions. Takes 112 mcg every day, except Wednesday take 224 mcg.   oxyCODONE 5 MG immediate release tablet Commonly known as:  Oxy IR/ROXICODONE Take 1-2 tablets (5-10 mg total) by mouth every 4 (four) hours as needed for moderate pain.   rizatriptan 10 MG tablet Commonly known as:  MAXALT Take 1 tablet (  10 mg total) by mouth 3 (three) times daily as needed for migraine. May repeat in 2 hours if needed   topiramate 100 MG tablet Commonly known as:  TOPAMAX Take 1 tablet (100 mg total) by mouth at bedtime.      Follow-up Information    [facility] Follow up on 01/09/2019.   Specialty:  General Surgery Why:  8:45am, arrive by 8:15am for paperwork and check in.  please bring photo ID and insurance card Contact information: [address] [place] [address] [phone]           Signed: [name] 12/21/2018, 2:28 AM

## 2018-12-26 ENCOUNTER — Encounter (HOSPITAL_COMMUNITY): Payer: Self-pay

## 2018-12-26 ENCOUNTER — Other Ambulatory Visit: Payer: Self-pay

## 2018-12-26 ENCOUNTER — Emergency Department (HOSPITAL_COMMUNITY)
Admission: EM | Admit: 2018-12-26 | Discharge: 2018-12-26 | Disposition: A | Discharge status: Home / Self Care | Payer: BLUE CROSS/BLUE SHIELD | Attending: Emergency Medicine | Admitting: Emergency Medicine

## 2018-12-26 DIAGNOSIS — Z79899 Other long term (current) drug therapy: Secondary | ICD-10-CM | POA: Insufficient documentation

## 2018-12-26 DIAGNOSIS — Z9089 Acquired absence of other organs: Secondary | ICD-10-CM | POA: Insufficient documentation

## 2018-12-26 DIAGNOSIS — G8918 Other acute postprocedural pain: Secondary | ICD-10-CM | POA: Diagnosis not present

## 2018-12-26 DIAGNOSIS — S301XXA Contusion of abdominal wall, initial encounter: Secondary | ICD-10-CM

## 2018-12-26 DIAGNOSIS — E039 Hypothyroidism, unspecified: Secondary | ICD-10-CM | POA: Diagnosis not present

## 2018-12-26 DIAGNOSIS — Z4889 Encounter for other specified surgical aftercare: Secondary | ICD-10-CM | POA: Diagnosis present

## 2018-12-26 DIAGNOSIS — K91872 Postprocedural seroma of a digestive system organ or structure following a digestive system procedure: Secondary | ICD-10-CM | POA: Insufficient documentation

## 2018-12-26 LAB — LIPASE, BLOOD: LIPASE: 42 U/L (ref 11–51)

## 2018-12-26 LAB — COMPREHENSIVE METABOLIC PANEL
ALT: 94 U/L — ABNORMAL HIGH (ref 0–44)
ANION GAP: 11 (ref 5–15)
AST: 34 U/L (ref 15–41)
Albumin: 4.7 g/dL (ref 3.5–5.0)
Alkaline Phosphatase: 138 U/L — ABNORMAL HIGH (ref 38–126)
BUN: 20 mg/dL (ref 6–20)
CHLORIDE: 105 mmol/L (ref 98–111)
CO2: 23 mmol/L (ref 22–32)
Calcium: 9.7 mg/dL (ref 8.9–10.3)
Creatinine, Ser: 1.19 mg/dL (ref 0.61–1.24)
GFR calc Af Amer: >60 mL/min (ref >60)
GFR calc non Af Amer: >60 mL/min (ref >60)
Glucose, Bld: 124 mg/dL — ABNORMAL HIGH (ref 70–99)
POTASSIUM: 3.8 mmol/L (ref 3.5–5.1)
Sodium: 139 mmol/L (ref 135–145)
Total Bilirubin: 0.6 mg/dL (ref 0.3–1.2)
Total Protein: 8.1 g/dL (ref 6.5–8.1)

## 2018-12-26 LAB — CBC
HEMATOCRIT: 42.8 % (ref 39.0–52.0)
Hemoglobin: 13.9 g/dL (ref 13.0–17.0)
MCH: 29.5 pg (ref 26.0–34.0)
MCHC: 32.5 g/dL (ref 30.0–36.0)
MCV: 90.9 fL (ref 80.0–100.0)
Platelets: 262 10*3/uL (ref 150–400)
RBC: 4.71 MIL/uL (ref 4.22–5.81)
RDW: 12.9 % (ref 11.5–15.5)
WBC: 8 10*3/uL (ref 4.0–10.5)
nRBC: 0 % (ref 0.0–0.2)

## 2018-12-26 LAB — URINALYSIS, ROUTINE W REFLEX MICROSCOPIC
Bilirubin Urine: NEGATIVE
Glucose, UA: NEGATIVE mg/dL
HGB URINE DIPSTICK: NEGATIVE
Ketones, ur: NEGATIVE mg/dL
Leukocytes, UA: NEGATIVE
Nitrite: NEGATIVE
Protein, ur: NEGATIVE mg/dL
Specific Gravity, Urine: 1.026 (ref 1.005–1.030)
pH: 5 (ref 5.0–8.0)

## 2018-12-26 NOTE — ED Provider Notes (Signed)
[facility]-EMERGENCY DEPT Provider Note   CSN: [id number] Arrival date & time: 12/26/18  1105     History   Chief Complaint Chief Complaint  Patient presents with  . Post-op Problem    HPI Thomas Burgess is a 43 y.o. male.  He is presenting to the emergency department with complaint of bleeding from 1 of his laparoscopic port sites.  He had a lap appendectomy done about 9 days ago and has had continued pain at his umbilical incision.  He said it bled on the first postop day and stopped and has not bled since then.  He is used up all his pain medicine.  He said he woke up this morning and the Tegaderm that is covering the site was full of blood.  No fevers or chills.  No vomiting or diarrhea.  The history is provided by the patient.  Abdominal Pain  Pain location:  Periumbilical Pain quality: aching   Pain radiates to:  Does not radiate Pain severity:  Moderate Onset quality:  Gradual Timing:  Constant Progression:  Unchanged Chronicity:  New Context: previous surgery   Relieved by:  Nothing Worsened by:  Movement and palpation Ineffective treatments:  None tried Associated symptoms: no chest pain, no chills, no diarrhea, no dysuria, no fever, no nausea, no shortness of breath, no sore throat and no vomiting   Risk factors: recent hospitalization     Past Medical History:  Diagnosis Date  . Common migraine 09/28/2017  . Gout   . Headache syndrome 09/28/2017   Right occipital  . Hypothyroid   . Migraine     Patient Active Problem List   Diagnosis Date Noted  . Periumbilical abdominal pain 12/17/2018  . Headache syndrome 09/28/2017  . Common migraine 09/28/2017  . Primary localized osteoarthritis of left knee 09/19/2017    Past Surgical History:  Procedure Laterality Date  . FEMUR SURGERY    . LAPAROSCOPIC APPENDECTOMY N/A 12/18/2018   Procedure: APPENDECTOMY LAPAROSCOPIC;  Surgeon: Almond LintByerly, Faera, MD;  Location: [facility];  Service: General;   Laterality: N/A;  . 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 LEG SURGERY     Removed hardware        Home Medications    Prior to Admission medications   Medication Sig Start Date End Date Taking? Authorizing Provider  acetaminophen (TYLENOL) 500 MG tablet Take 1,000 mg by mouth 2 (two) times daily as needed for moderate pain.   Yes [provider]  allopurinol (ZYLOPRIM) 100 MG tablet Take 100 mg by mouth at bedtime.    Yes [provider]  FLUoxetine (PROZAC) 20 MG tablet Take 20 mg by mouth daily.   Yes [provider]  levothyroxine (SYNTHROID, LEVOTHROID) 112 MCG tablet Take 112 mcg by mouth See admin instructions. Takes 112 mcg every day, except Wednesday take 224 mcg.   Yes [provider]  rizatriptan (MAXALT) 10 MG tablet Take 1 tablet (10 mg total) by mouth 3 (three) times daily as needed for migraine. May repeat in 2 hours if needed 11/14/17  Yes York SpanielWillis, Charles K, MD  topiramate (TOPAMAX) 100 MG tablet Take 1 tablet (100 mg total) by mouth at bedtime. 03/27/18  Yes Butch PennyMillikan, Megan, NP  oxyCODONE (OXY IR/ROXICODONE) 5 MG immediate release tablet Take 1-2 tablets (5-10 mg total) by mouth every 4 (four) hours as needed for moderate pain. Patient not taking: Reported on 12/26/2018 12/19/18   Almond LintByerly, Faera, MD    Family  History Family History  Problem Relation Age of Onset  . Congestive Heart Failure Mother   . Diabetes Mother   . Kidney failure Father   . Brain cancer Cousin     Social History Social History   Tobacco Use  . Smoking status: Never Smoker  . Smokeless tobacco: Never Used  Substance Use Topics  . Alcohol use: No  . Drug use: No     Allergies   Patient has no known allergies.   Review of Systems Review of Systems  Constitutional: Negative for chills and fever.  HENT: Negative for sore throat.   Eyes: Negative for visual disturbance.  Respiratory: Negative for shortness of breath.   Cardiovascular:  Negative for chest pain.  Gastrointestinal: Positive for abdominal pain. Negative for diarrhea, nausea and vomiting.  Genitourinary: Negative for dysuria.  Musculoskeletal: Negative for back pain.  Skin: Positive for wound. Negative for rash.  Neurological: Negative for headaches.     Physical Exam Updated Vital Signs BP 124/87 (BP Location: Right Arm)   Pulse (!) 101   Temp 97.8 F (36.6 C) (Oral)   Resp 19   Ht 6\' 2"  (1.88 m)   Wt 96.4 kg   SpO2 100%   BMI 27.28 kg/m   Physical Exam Vitals signs and nursing note reviewed.  Constitutional:      Appearance: He is well-developed.  HENT:     Head: Normocephalic and atraumatic.  Eyes:     Conjunctiva/sclera: Conjunctivae normal.  Neck:     Musculoskeletal: Neck supple.  Cardiovascular:     Rate and Rhythm: Normal rate and regular rhythm.     Heart sounds: No murmur.  Pulmonary:     Effort: Pulmonary effort is normal. No respiratory distress.     Breath sounds: Normal breath sounds.  Abdominal:     Palpations: Abdomen is soft.     Tenderness: There is abdominal tenderness. There is no guarding.     Comments: Patient has fairly broad band of ecchymosis at the level of his umbilicus.  There is some dark blood that is expressed with palpation of the area from a defect at his port site.  It is quite tender.  Musculoskeletal: Normal range of motion.        General: No tenderness or signs of injury.  Skin:    General: Skin is warm and dry.     Capillary Refill: Capillary refill takes less than 2 seconds.  Neurological:     General: No focal deficit present.     Mental Status: He is alert.      ED Treatments / Results  Labs (all labs ordered are listed, but only abnormal results are displayed) Labs Reviewed  COMPREHENSIVE METABOLIC PANEL - Abnormal; Notable for the following components:      Result Value   Glucose, Bld 124 (*)    ALT 94 (*)    Alkaline Phosphatase 138 (*)    All other components within normal  limits  LIPASE, BLOOD  CBC  URINALYSIS, ROUTINE W REFLEX MICROSCOPIC    EKG None  Radiology No results found.  Procedures Procedures (including critical care time)  Medications Ordered in ED Medications - No data to display   Initial Impression / Assessment and Plan / ED Course  I have reviewed the triage vital signs and the nursing notes.  Pertinent labs & imaging results that were available during my care of the patient were reviewed by me and considered in my medical decision making (see chart  for details).  Clinical Course as of Dec 27 844  Tue Dec 26, 2018  1355 Discussed with general surgery PA who felt that the patient did not need any intervention and can be referred back to the clinic.  Recommend dressing and NSAIDs and warm compresses.  Reviewed this with the patient and he is comfortable with plan.   [MB]    Clinical Course User Index [MB] Terrilee Files, MD     Final Clinical Impressions(s) / ED Diagnoses   Final diagnoses:  Abdominal wall seroma, initial encounter  Post-operative pain    ED Discharge Orders    None       Terrilee Files, MD 12/27/18 [phone]

## 2018-12-26 NOTE — Discharge Instructions (Signed)
You were seen in the emergency department for drainage from your postoperative site.  This is likely old blood that is pooling underneath the skin.  Your blood counts were normal here.  Surgery recommended continuing to use a dressing over the area and to use ibuprofen and a warm compress.  They recommend you keep your appointment for follow-up and call the office if any worsening symptoms.

## 2018-12-26 NOTE — ED Triage Notes (Signed)
Patient arrived via POV with friend driving patient to ED. Patient is AOx4 and ambulatory. Patient had surgery December 30th for appendectomy. Patient sneezed / coughed sometime last night and has now began to bleed. Bleeding is controlled however it is unknown is staples/stiches are still in-tact. Patient has Tegaderm placed over gauze over incision site to control bleeding.

## 2019-01-30 ENCOUNTER — Encounter (INDEPENDENT_AMBULATORY_CARE_PROVIDER_SITE_OTHER): Payer: Self-pay | Admitting: Orthopaedic Surgery

## 2019-01-30 ENCOUNTER — Ambulatory Visit (INDEPENDENT_AMBULATORY_CARE_PROVIDER_SITE_OTHER): Payer: BLUE CROSS/BLUE SHIELD | Admitting: Orthopaedic Surgery

## 2019-01-30 VITALS — Ht 74.0 in | Wt 212.4 lb

## 2019-01-30 DIAGNOSIS — M1712 Unilateral primary osteoarthritis, left knee: Secondary | ICD-10-CM | POA: Diagnosis not present

## 2019-01-30 MED ORDER — LIDOCAINE HCL 1 % IJ SOLN
5.0000 mL | INTRAMUSCULAR | Status: AC | PRN
  Administered 2019-01-30: 5 mL

## 2019-01-30 MED ORDER — HYLAN G-F 20 48 MG/6ML IX SOSY
48.0000 mg | PREFILLED_SYRINGE | INTRA_ARTICULAR | Status: AC | PRN
  Administered 2019-01-30: 48 mg via INTRA_ARTICULAR

## 2019-01-30 NOTE — Progress Notes (Signed)
   Procedure Note  Patient: Thomas Burgess             Date of Birth: 04/19/1976           MRN: [id number]             Visit Date: 01/30/2019  Procedures: Visit Diagnoses: Primary localized osteoarthritis of left knee  Large Joint Inj: L knee on 01/30/2019 8:53 AM Indications: pain Details: 22 G needle  Arthrogram: No  Medications: 48 mg Hylan 48 MG/6ML; 5 mL lidocaine 1 % Outcome: tolerated well, no immediate complications Patient was prepped and draped in the usual sterile fashion.

## 2019-03-19 ENCOUNTER — Ambulatory Visit (INDEPENDENT_AMBULATORY_CARE_PROVIDER_SITE_OTHER): Payer: BLUE CROSS/BLUE SHIELD

## 2019-03-19 ENCOUNTER — Other Ambulatory Visit: Payer: Self-pay

## 2019-03-19 ENCOUNTER — Encounter: Payer: Self-pay | Admitting: Emergency Medicine

## 2019-03-19 ENCOUNTER — Ambulatory Visit
Admission: EM | Admit: 2019-03-19 | Discharge: 2019-03-19 | Disposition: A | Discharge status: Home / Self Care | Payer: BLUE CROSS/BLUE SHIELD | Attending: Nurse Practitioner | Admitting: Nurse Practitioner

## 2019-03-19 DIAGNOSIS — M25512 Pain in left shoulder: Secondary | ICD-10-CM

## 2019-03-19 MED ORDER — KETOROLAC TROMETHAMINE 60 MG/2ML IM SOLN
60.0000 mg | Freq: Once | INTRAMUSCULAR | Status: AC
  Administered 2019-03-19: 60 mg via INTRAMUSCULAR

## 2019-03-19 NOTE — ED Triage Notes (Signed)
Pt presents to [facility] for 1 month of left shoulder pain.  Denies known injury.  States when he goes to lift his shoulder or use it for lifting it causes "fifteen out of ten" pain.  Pt c/o numbness at 10-15 minute increments when pain is severe.

## 2019-03-19 NOTE — ED Provider Notes (Signed)
[facility]    CSN: [id number] Arrival date & time: 03/19/19  1117     History   Chief Complaint Chief Complaint  Patient presents with  . Shoulder Pain    HPI Thomas Burgess is a 43 y.o. male.   History of Present Illness  Thomas Burgess is a 43 y.o. male that complains of pain in the left shoulder pain. He denies any injury or trauma. Onset of symptoms was 1 month ago. There is is not a history of a previous shoulder injury. Patient describes pain as aching, sharp/stabbing and tingling. Pain severity now is 3 /10 and at worst was 10 /10. The pain does radiate. Pain is aggravated by movement and use. Pain is alleviated by nothing. Symptoms associated with pain include numbness, tingling and weakness to the left arm. Care prior to arrival consisted of nothing.        Past Medical History:  Diagnosis Date  . Common migraine 09/28/2017  . Gout   . Headache syndrome 09/28/2017   Right occipital  . Hypothyroid   . Migraine     Patient Active Problem List   Diagnosis Date Noted  . Periumbilical abdominal pain 12/17/2018  . Headache syndrome 09/28/2017  . Common migraine 09/28/2017  . Primary localized osteoarthritis of left knee 09/19/2017    Past Surgical History:  Procedure Laterality Date  . FEMUR SURGERY    . LAPAROSCOPIC APPENDECTOMY N/A 12/18/2018   Procedure: APPENDECTOMY LAPAROSCOPIC;  Surgeon: Almond LintByerly, Faera, MD;  Location: [facility];  Service: General;  Laterality: N/A;  . 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 LEG SURGERY     Removed hardware       Home Medications    Prior to Admission medications   Medication Sig Start Date End Date Taking? Authorizing Provider  acetaminophen (TYLENOL) 500 MG tablet Take 1,000 mg by mouth 2 (two) times daily as needed for moderate pain.    [provider]  allopurinol (ZYLOPRIM) 100 MG tablet Take 100 mg by mouth at bedtime.     [provider]  FLUoxetine (PROZAC) 20 MG  tablet Take 20 mg by mouth daily.    [provider]  levothyroxine (SYNTHROID, LEVOTHROID) 112 MCG tablet Take 112 mcg by mouth See admin instructions. Takes 112 mcg every day, except Wednesday take 224 mcg.    [provider]  oxyCODONE (OXY IR/ROXICODONE) 5 MG immediate release tablet Take 1-2 tablets (5-10 mg total) by mouth every 4 (four) hours as needed for moderate pain. 12/19/18   Almond LintByerly, Faera, MD  rizatriptan (MAXALT) 10 MG tablet Take 1 tablet (10 mg total) by mouth 3 (three) times daily as needed for migraine. May repeat in 2 hours if needed 11/14/17   York SpanielWillis, Charles K, MD  topiramate (TOPAMAX) 100 MG tablet Take 1 tablet (100 mg total) by mouth at bedtime. 03/27/18   Butch PennyMillikan, Megan, NP    Family History Family History  Problem Relation Age of Onset  . Congestive Heart Failure Mother   . Diabetes Mother   . Kidney failure Father   . Brain cancer Cousin     Social History Social History   Tobacco Use  . Smoking status: Never Smoker  . Smokeless tobacco: Never Used  Substance Use Topics  . Alcohol use: No  . Drug use: No     Allergies   Patient has no known allergies.   Review of Systems Review of Systems  Constitutional: Negative for fever.  Respiratory: Negative for cough and shortness of breath.   Musculoskeletal: Positive for arthralgias. Negative for myalgias.  All other systems reviewed and are negative.    Physical Exam Triage Vital Signs ED Triage Vitals [03/19/19 1132]  Enc Vitals Group     BP (!) 143/91     Pulse Rate 73     Resp 18     Temp (!) 97.3 F (36.3 C)     Temp Source Oral     SpO2 97 %     Weight      Height      Head Circumference      Peak Flow      Pain Score 4     Pain Loc      Pain Edu?      Excl. in GC?    No data found.  Updated Vital Signs BP (!) 143/91 (BP Location: Right Arm)   Pulse 73   Temp (!) 97.3 F (36.3 C) (Oral)   Resp 18   SpO2 97%   Visual Acuity Right Eye Distance:   Left  Eye Distance:   Bilateral Distance:    Right Eye Near:   Left Eye Near:    Bilateral Near:     Physical Exam Constitutional:      Appearance: Normal appearance.  Neck:     Musculoskeletal: Normal range of motion and neck supple.  Cardiovascular:     Rate and Rhythm: Normal rate.  Pulmonary:     Effort: Pulmonary effort is normal.  Musculoskeletal:        General: Tenderness present. No swelling, deformity or signs of injury.     Right shoulder: He exhibits decreased range of motion, tenderness and decreased strength. He exhibits no swelling, no effusion, no crepitus and no deformity.  Lymphadenopathy:     Cervical: No cervical adenopathy.  Skin:    General: Skin is warm and dry.  Neurological:     General: No focal deficit present.     Mental Status: He is alert and oriented to person, place, and time.  Psychiatric:        Mood and Affect: Mood normal.        Behavior: Behavior normal.        Thought Content: Thought content normal.        Judgment: Judgment normal.      UC Treatments / Results  Labs (all labs ordered are listed, but only abnormal results are displayed) Labs Reviewed - No data to display  EKG None  Radiology Dg Shoulder Left  Result Date: 03/19/2019 CLINICAL DATA:  Left shoulder pain X 1 month with numbness and weakness. Patient denies injury. EXAM: LEFT SHOULDER - 2+ VIEW COMPARISON:  None. FINDINGS: There is no evidence of fracture or dislocation. There is no evidence of arthropathy or other focal bone abnormality. Soft tissues are unremarkable. IMPRESSION: Negative. Electronically Signed   By: Amie Portland M.D.   On: 03/19/2019 12:16    Procedures Procedures (including critical care time)  Medications Ordered in UC Medications  ketorolac (TORADOL) injection 60 mg (60 mg Intramuscular Given 03/19/19 1246)    Initial Impression / Assessment and Plan / UC Course  I have reviewed the triage vital signs and the nursing notes.  Pertinent labs  & imaging results that were available during my care of the patient were reviewed by me and considered in my medical decision making (see chart for details).     43 year old male with a 1 month  history of left shoulder pain with numbness, tingling and weakness down the left arm.physical exam shows decreased range of motion and decreased strength.  X-ray shows no evidence of fracture, dislocation, arthropathy or other focal bone abnormality.  Advised emergent follow-up with [facility] in [place], [place]. Patient agreeable.  Patient given 60 mg of Toradol IM prior to discharge.  Today's evaluation has revealed no signs of a dangerous process. Discussed diagnosis with patient. Patient aware of their diagnosis, possible red flag symptoms to watch out for and need for close follow up. Patient understands verbal and written discharge instructions. Patient comfortable with plan and disposition.  Patient has a clear mental status at this time, good insight into illness (after discussion and teaching) and has clear judgment to make decisions regarding their care.  Documentation was completed with the aid of voice recognition software. Transcription may contain typographical errors. Final Clinical Impressions(s) / UC Diagnoses   Final diagnoses:  Acute pain of left shoulder     Discharge Instructions     The x-ray of your shoulder does not show any abnormalities.  I would like for you to go to the orthopedic urgent care as soon as possible for further evaluation.    ED Prescriptions    None     Controlled Substance Prescriptions [place] Controlled Substance Registry consulted? Not Applicable   Lurline Idol, [place] 03/19/19 1317

## 2019-03-19 NOTE — ED Notes (Signed)
Patient able to ambulate independently  

## 2019-03-19 NOTE — Discharge Instructions (Signed)
The x-ray of your shoulder does not show any abnormalities.  I would like for you to go to the orthopedic urgent care as soon as possible for further evaluation.

## 2019-03-31 IMAGING — CT CT ABD-PELV W/ CM
2 of 5 series · 16 of 46 positions shown, 18 images · IV contrast (ISOVUE)
Comparison: CT of the abdomen and pelvis performed 05/14/2005

CLINICAL DATA: Acute onset of generalized abdominal pain and
nausea.

EXAM:
CT ABDOMEN AND PELVIS WITH CONTRAST
TECHNIQUE: Multidetector CT imaging of the abdomen and pelvis was performed
using the standard protocol following bolus administration of
intravenous contrast.
CONTRAST:  100mL ZCR7YS-7HH IOPAMIDOL (ZCR7YS-7HH) INJECTION 61%

[Series 2: axial st · axial · 0.93mm/px · z∈[+1440,+1890]mm · 13 of 102 slices shown, 15 images]
[im 6/102  soft-tissue]
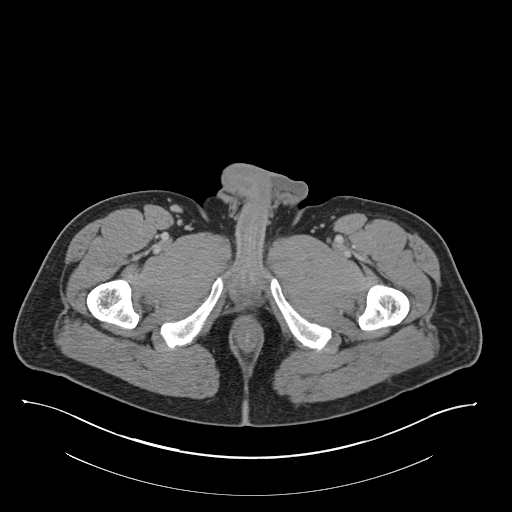
[im 6/102  bone]
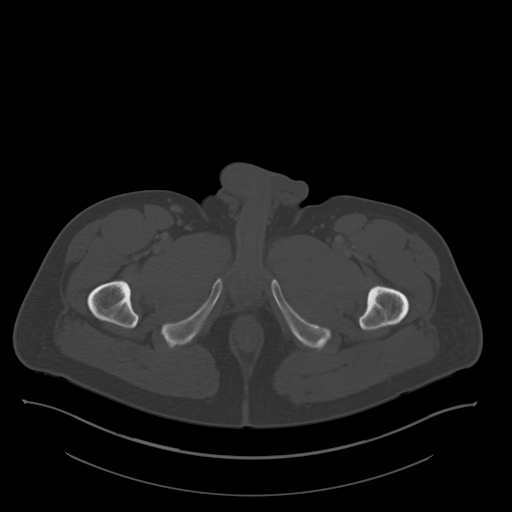
[im 12/102  soft-tissue]
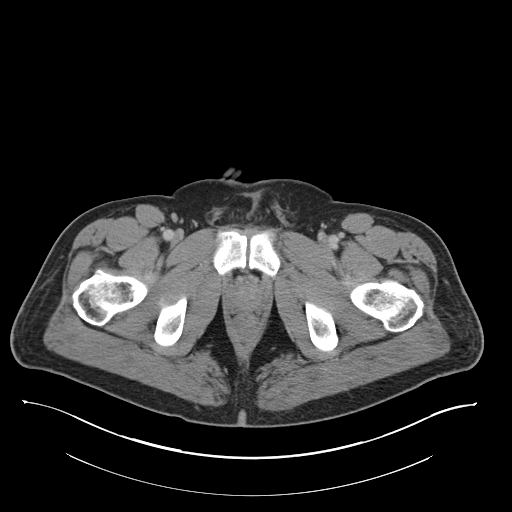
[im 24/102  soft-tissue]
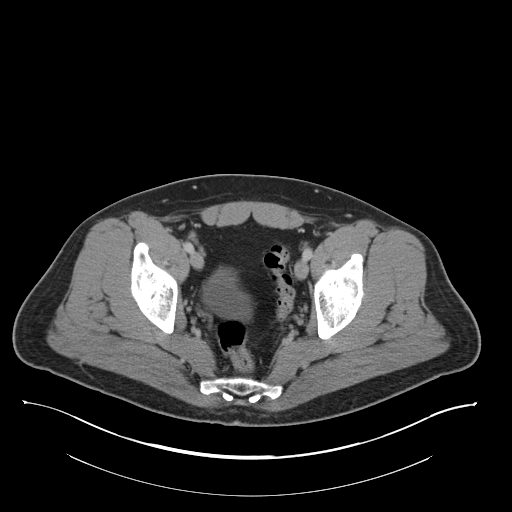
[im 30/102  soft-tissue]
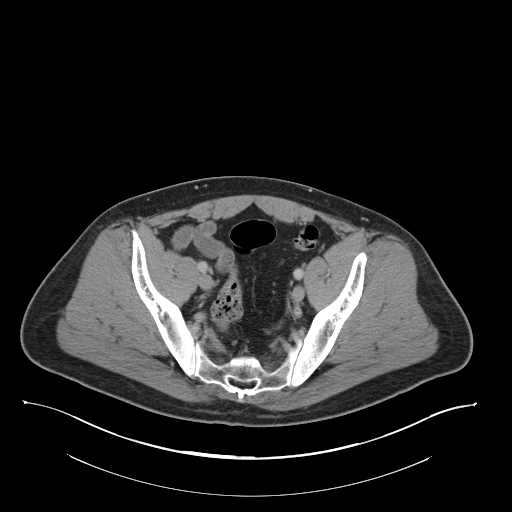
[im 36/102  soft-tissue]
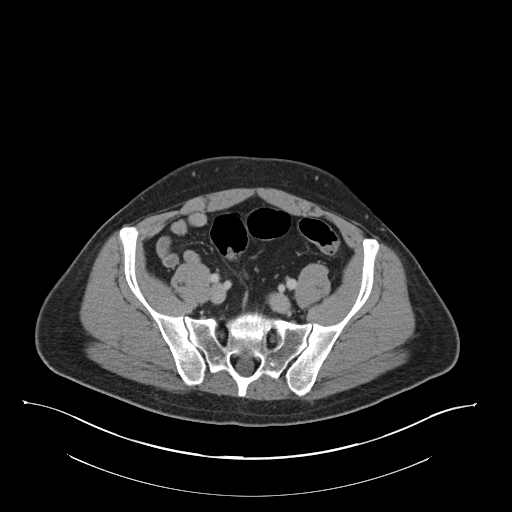
[im 42/102  soft-tissue]
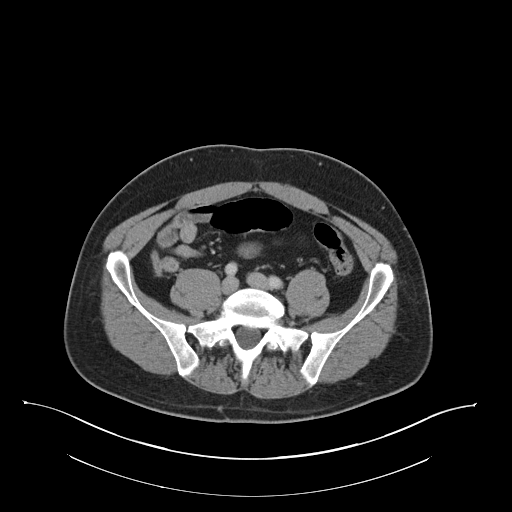
[im 54/102  soft-tissue]
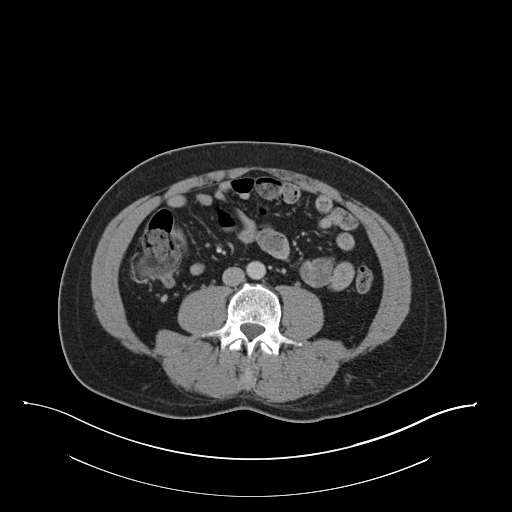
[im 60/102  soft-tissue]
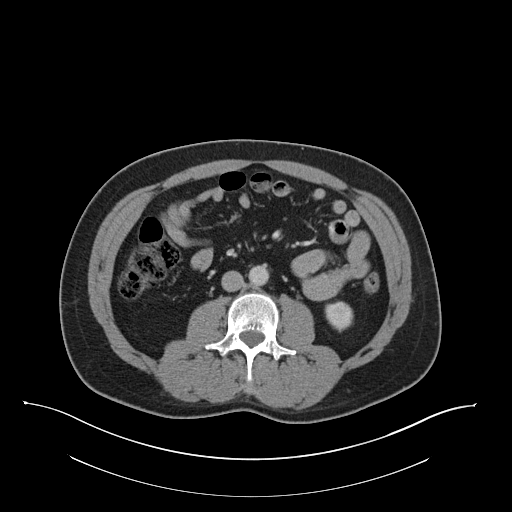
[im 66/102  soft-tissue]
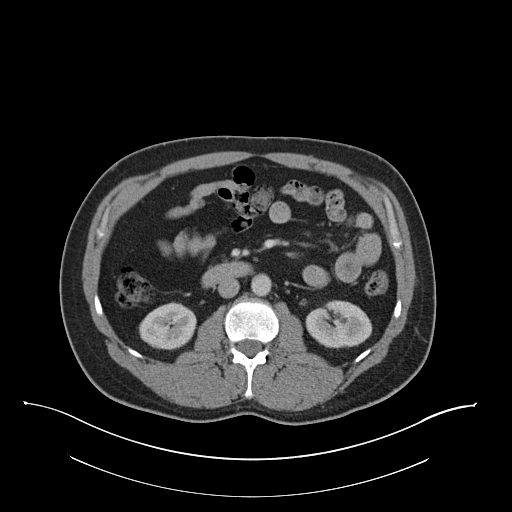
[im 66/102  bone]
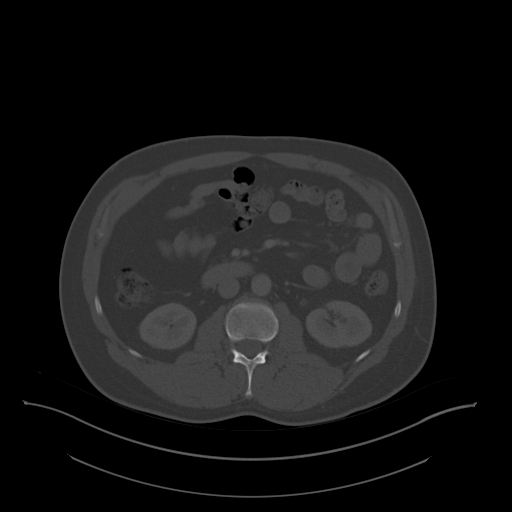
[im 72/102  soft-tissue]
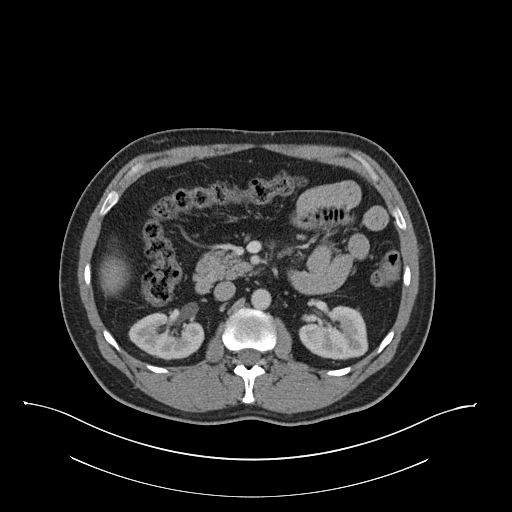
[im 78/102  soft-tissue]
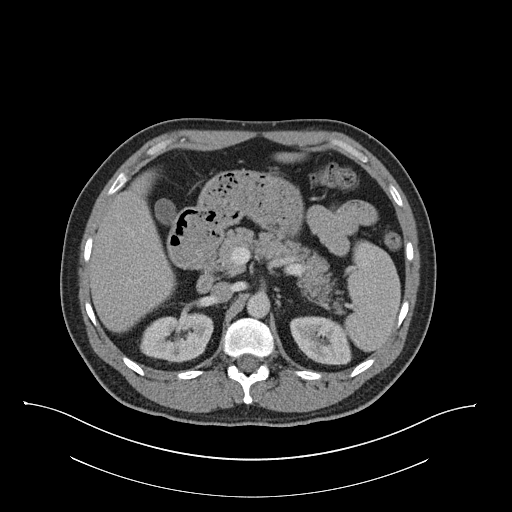
[im 90/102  soft-tissue]
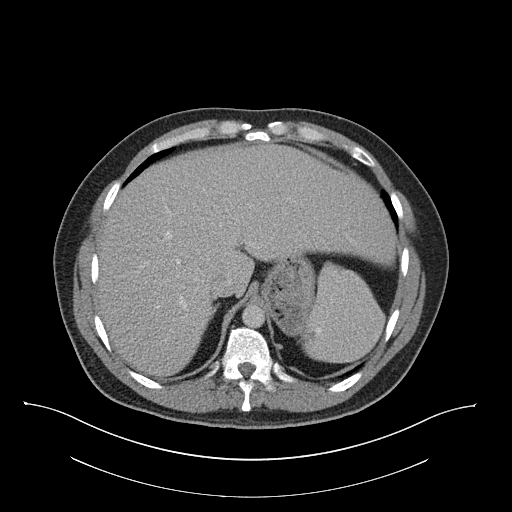
[im 96/102  soft-tissue]
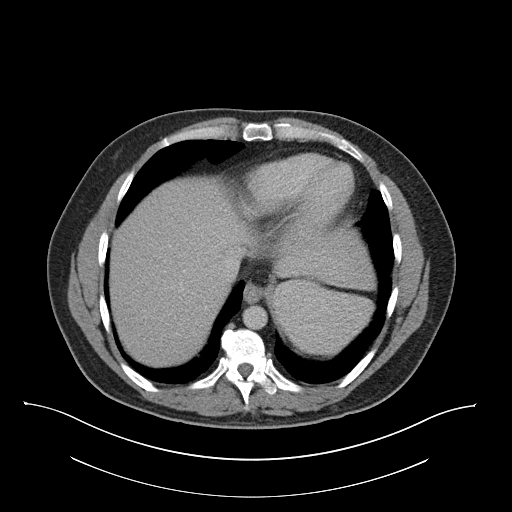

[Series 4: coronal st · coronal · 0.96mm/px · 3 of 101 slices shown]
[im 34/101  soft-tissue]
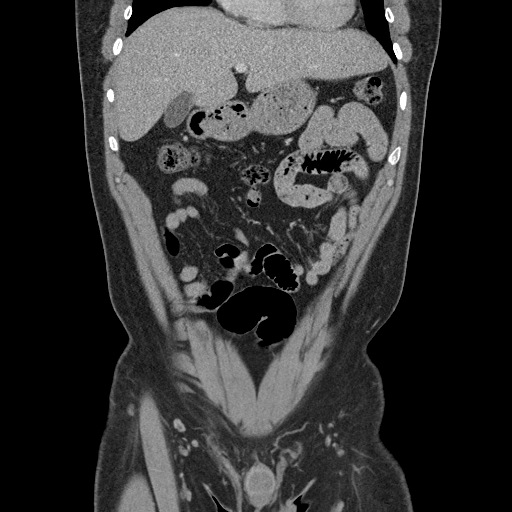
[im 45/101  soft-tissue]
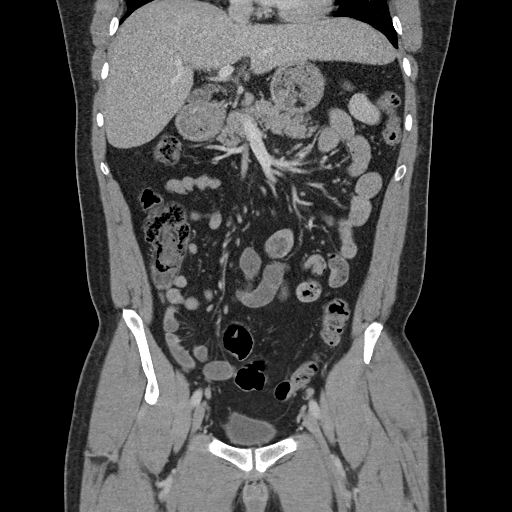
[im 56/101  soft-tissue]
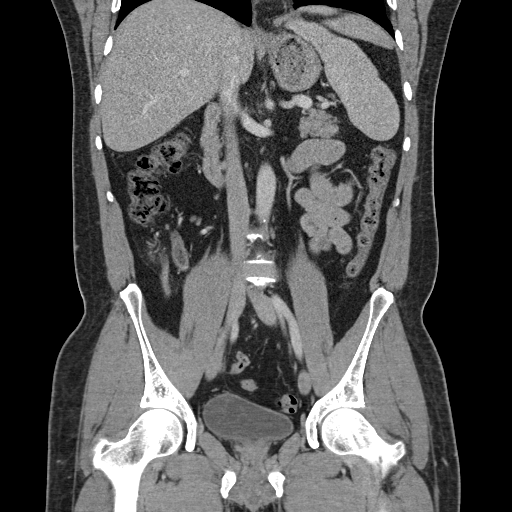

[16 of 46 positions shown; findings below may reference images not displayed]

FINDINGS: Lower chest: Minimal atelectasis is noted at the lung bases. The
visualized portions of the mediastinum are unremarkable.

Hepatobiliary: The liver is unremarkable in appearance. The
gallbladder is unremarkable in appearance. The common bile duct
remains normal in caliber.

Pancreas: The pancreas is within normal limits.

Spleen: The spleen is enlarged, measuring 15.0 cm in length.

Adrenals/Urinary Tract: The adrenal glands are unremarkable in
appearance. The kidneys are within normal limits. There is no
evidence of hydronephrosis. No renal or ureteral stones are
identified. No perinephric stranding is seen.

Stomach/Bowel: The stomach is unremarkable in appearance. The small
bowel is within normal limits.

Trace inflammation is noted about the appendix, though it remains
normal in caliber. There is no definite evidence of appendicitis.
The colon is unremarkable in appearance.

Vascular/Lymphatic: The abdominal aorta is unremarkable in
appearance. The inferior vena cava is grossly unremarkable. No
retroperitoneal lymphadenopathy is seen. No pelvic sidewall
lymphadenopathy is identified.

Reproductive: The bladder is mildly distended and grossly
unremarkable. The prostate is normal in size.

Other: No additional soft tissue abnormalities are seen.

Musculoskeletal: No acute osseous abnormalities are identified. The
visualized musculature is unremarkable in appearance.
IMPRESSION: 1. No acute abnormality seen within the abdomen or pelvis.
2. Splenomegaly.

## 2019-04-25 ENCOUNTER — Other Ambulatory Visit: Payer: Self-pay | Admitting: Adult Health

## 2019-05-09 ENCOUNTER — Other Ambulatory Visit: Payer: Self-pay | Admitting: Neurology

## 2019-05-23 ENCOUNTER — Telehealth: Payer: Self-pay | Admitting: *Deleted

## 2019-05-23 NOTE — Telephone Encounter (Signed)
Due to current COVID 19 pandemic, our office is severely reducing in office visits until further notice, in order to minimize the risk to our patients and healthcare providers.  Pt understands that although there may be some limitations with this type of visit, we will take all precautions to reduce any security or privacy concerns.  Pt understands that this will be treated like an in office visit and we will file with pt's insurance, and there may be a patient responsible charge related to this service. Pt consented to [email or web address] visit.   Email sent to [email or web address].

## 2019-05-27 NOTE — Progress Notes (Signed)
    Virtual Visit via Video Note  I connected with Evalina Field on 05/28/19 at  8:15 AM EDT by a video enabled telemedicine application and verified that I am speaking with the correct person using two identifiers.  Location: Patient: At his home Provider: In the office    I discussed the limitations of evaluation and management by telemedicine and the availability of in person appointments. The patient expressed understanding and agreed to proceed.  History of Present Illness: 05/28/2019 SS: Mr. Rushlow is a 43 year old male with history of headaches and possible occipital neuralgia. He is currently taking Topamax 100 mg at bedtime.  He indicates he has been doing well.  He says he will have about 1 headache per month, 2 at the most.  If he misses his Topamax, he is surely to get a headache the next day.  When the headache occurs, he describes sharp, shooting pain on the right occipital area, radiating forward.  He will take 1-2 doses of Maxalt, does not completely relieve the pain, he will have to go home and go to sleep.  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   10/09/2018 MM: Mr. Wigington is a 43 year old male with a history of headaches and possible occipital neuralgia.  He returns today for follow-up.  He continues to have approximately 1-3 headaches a month.  His headaches always occur in the right occipital region.  He describes it as a sharp shooting pain.  He denies light or noise sensitivity.  He also have nausea but no vomiting.  He states that he typically can take 1-2 doses of Maxalt and go to sleep but has had a full resolve within 24 hours.  He returns today for evaluation.   Observations/Objective: Alert, answers questions appropriately, follows commands, speech is clear and concise, facial symmetry noted, no arm drift, no problems with gait  Assessment and Plan: 1.  Right  occipital headache, possible migraine   He will continue Topamax 100 mg at bedtime.  Maxalt is not completely relieving his right sided occipital pain/headache.  For this reason, he will try baclofen 5 mg for acute headache, can repeat once if needed.  He will let me know if this is beneficial or if he needs dose adjustment.  In the future, if his symptoms are more consistent with occipital neuralgia, may try gabapentin daily.  At current, he is having about 1 headache per month.   Follow Up Instructions: 6 months I have made follow-up appointment 12/03/2019 11:15 am   I discussed the assessment and treatment plan with the patient. The patient was provided an opportunity to ask questions and all were answered. The patient agreed with the plan and demonstrated an understanding of the instructions.   The patient was advised to call back or seek an in-person evaluation if the symptoms worsen or if the condition fails to improve as anticipated.  I provided 15 minutes of non-face-to-face time during this encounter.  Evangeline Dakin, DNP  [facility] [address] [place], [place] [address] [phone]

## 2019-05-28 ENCOUNTER — Other Ambulatory Visit: Payer: Self-pay

## 2019-05-28 ENCOUNTER — Ambulatory Visit (INDEPENDENT_AMBULATORY_CARE_PROVIDER_SITE_OTHER): Payer: BC Managed Care – PPO | Admitting: Neurology

## 2019-05-28 ENCOUNTER — Ambulatory Visit: Payer: BLUE CROSS/BLUE SHIELD | Admitting: Adult Health

## 2019-05-28 ENCOUNTER — Encounter: Payer: Self-pay | Admitting: Neurology

## 2019-05-28 DIAGNOSIS — G4489 Other headache syndrome: Secondary | ICD-10-CM

## 2019-05-28 MED ORDER — TOPIRAMATE 100 MG PO TABS: 100.0000 mg | ORAL_TABLET | Freq: Every day | ORAL | 3 refills | Status: DC

## 2019-05-28 MED ORDER — BACLOFEN 5 MG PO TABS: ORAL_TABLET | ORAL | 0 refills | Status: DC

## 2019-05-28 NOTE — Progress Notes (Signed)
I have read the note, and I agree with the clinical assessment and plan.  Charles K Willis   

## 2019-07-06 ENCOUNTER — Other Ambulatory Visit: Payer: Self-pay | Admitting: Neurology

## 2019-07-10 NOTE — Telephone Encounter (Signed)
I called pt.  He is taking baclofen prn.  He states has only 1-2 headaches since.  He does not need refill of baclofen right now.  He will let us know.  But it did work well for him when he has taken it.

## 2019-12-03 ENCOUNTER — Ambulatory Visit (INDEPENDENT_AMBULATORY_CARE_PROVIDER_SITE_OTHER): Payer: BC Managed Care – PPO | Admitting: Neurology

## 2019-12-03 ENCOUNTER — Other Ambulatory Visit: Payer: Self-pay

## 2019-12-03 ENCOUNTER — Encounter: Payer: Self-pay | Admitting: Neurology

## 2019-12-03 VITALS — BP 124/70 | HR 78 | Temp 97.6°F | Ht 74.0 in | Wt 221.0 lb

## 2019-12-03 DIAGNOSIS — G4489 Other headache syndrome: Secondary | ICD-10-CM | POA: Diagnosis not present

## 2019-12-03 MED ORDER — TOPIRAMATE 50 MG PO TABS: ORAL_TABLET | ORAL | 6 refills | Status: DC

## 2019-12-03 MED ORDER — BACLOFEN 5 MG PO TABS: 5.0000 mg | ORAL_TABLET | ORAL | 1 refills | Status: DC | PRN

## 2019-12-03 NOTE — Patient Instructions (Addendum)
Let's try an increase in topamax taking 125 mg at bedtime, if you do well with that dose, we can go up to 150 mg at bedtime. Please call for dose adjustment.   Continue the Baclofen, Maxalt as needed.   See you in 6 months or sooner if needed.

## 2019-12-03 NOTE — Progress Notes (Signed)
PATIENT: Thomas Burgess DOB: 03-29-1976  REASON FOR VISIT: follow up HISTORY FROM: patient  HISTORY OF PRESENT ILLNESS: Today 12/03/19  Thomas Burgess is a 43 year old male with history of headaches and possible occipital neuralgia.  He remains on Topamax 100 mg at bedtime.  He is also taking baclofen and Maxalt. He reports when he develops headaches, it is in the evening.  At that time, he will take baclofen, then Maxalt if headache is still there and go to bed.  He reports this may happen 1-2 times a month.  Unfortunately, he has to go into work around 3 AM, and the headache is still present, forcing him to miss work.  He reports the next day by lunch his headache is gone.  He reports his headache always occurs in the right occipital area.  He reports Topamax has been helpful for his headaches.  He does report he notices some memory side effect, but does not impact is daily activity.  He denies typical migraine features such as photophobia, phonophobia, nausea, or vomiting.  He presents today for follow-up unaccompanied.  HISTORY 05/28/2019 SS: Thomas Burgess is a 43 year old male with history of headaches and possible occipital neuralgia. He is currently taking Topamax 100 mg at bedtime.  He indicates he has been doing well.  He says he will have about 1 headache per month, 2 at the most.  If he misses his Topamax, he is surely to get a headache the next day.  When the headache occurs, he describes sharp, shooting pain on the right occipital area, radiating forward.  He will take 1-2 doses of Maxalt, does not completely relieve the pain, he will have to go home and go to sleep.  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   REVIEW OF SYSTEMS: Out of a complete 14 system review of symptoms, the patient complains only of the following symptoms, and all other reviewed systems are negative.  Headache    ALLERGIES: No Known Allergies  HOME MEDICATIONS: Outpatient Medications Prior to Visit  Medication Sig Dispense Refill  . acetaminophen (TYLENOL) 500 MG tablet Take 1,000 mg by mouth 2 (two) times daily as needed for moderate pain.    Marland Kitchen allopurinol (ZYLOPRIM) 100 MG tablet Take 100 mg by mouth at bedtime.     Marland Kitchen FLUoxetine (PROZAC) 20 MG tablet Take 20 mg by mouth daily.    Marland Kitchen levothyroxine (SYNTHROID, LEVOTHROID) 112 MCG tablet Take 112 mcg by mouth See admin instructions. Takes 112 mcg every day, except Wednesday take 224 mcg.    . meloxicam (MOBIC) 7.5 MG tablet Take 7.5 mg by mouth daily.    . rizatriptan (MAXALT) 10 MG tablet TAKE 1 TABLET BY MOUTH THREE TIMES DAILYAS NEEDED FOR MIGRAINE. MAY REPEAT IN 2 HOURS IF NEEDED. 10 tablet 5  . baclofen 5 MG TABS Take 1 tablet at onset of headache pain, may repeat dose in 24 hours if needed 30 tablet 0  . topiramate (TOPAMAX) 100 MG tablet Take 1 tablet (100 mg total) by mouth at bedtime. 90 tablet 3  . oxyCODONE (OXY IR/ROXICODONE) 5 MG immediate release tablet Take 1-2 tablets (5-10 mg total) by mouth every 4 (four) hours as needed for moderate pain. (Patient not taking: Reported on 05/28/2019) 20 tablet 0   No facility-administered medications prior to visit.    PAST MEDICAL HISTORY: Past Medical History:  Diagnosis Date  .  Common migraine 09/28/2017  . Gout   . Headache syndrome 09/28/2017   Right occipital  . Hypothyroid   . Migraine     PAST SURGICAL HISTORY: Past Surgical History:  Procedure Laterality Date  . FEMUR SURGERY    . LAPAROSCOPIC APPENDECTOMY N/A 12/18/2018   Procedure: APPENDECTOMY LAPAROSCOPIC;  Surgeon: Almond LintByerly, Faera, MD;  Location: [facility];  Service: General;  Laterality: N/A;  . 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 LEG SURGERY     Removed hardware    FAMILY HISTORY: Family History  Problem Relation Age of Onset  . Congestive Heart Failure Mother   . Diabetes Mother   . Kidney failure Father    . Brain cancer Cousin     SOCIAL HISTORY: Social History   Socioeconomic History  . Marital status: Married    Spouse name: Not on file  . Number of children: 1  . Years of education: 5712  . Highest education level: Not on file  Occupational History  . Occupation: [employer]  Tobacco Use  . Smoking status: Never Smoker  . Smokeless tobacco: Never Used  Substance and Sexual Activity  . Alcohol use: No  . Drug use: No  . Sexual activity: Not Currently    Birth control/protection: None  Other Topics Concern  . Not on file  Social History Narrative   Lives with wife, daughter   Caffeine use: soft drinks daily      Right handed    Social Determinants of Health   Financial Resource Strain:   . Difficulty of Paying Living Expenses: Not on file  Food Insecurity:   . Worried About Programme researcher, broadcasting/film/videounning Out of Food in the Last Year: Not on file  . Ran Out of Food in the Last Year: Not on file  Transportation Needs:   . Lack of Transportation (Medical): Not on file  . Lack of Transportation (Non-Medical): Not on file  Physical Activity:   . Days of Exercise per Week: Not on file  . Minutes of Exercise per Session: Not on file  Stress:   . Feeling of Stress : Not on file  Social Connections:   . Frequency of Communication with Friends and Family: Not on file  . Frequency of Social Gatherings with Friends and Family: Not on file  . Attends Religious Services: Not on file  . Active Member of Clubs or Organizations: Not on file  . Attends BankerClub or Organization Meetings: Not on file  . Marital Status: Not on file  Intimate Partner Violence:   . Fear of Current or Ex-Partner: Not on file  . Emotionally Abused: Not on file  . Physically Abused: Not on file  . Sexually Abused: Not on file    PHYSICAL EXAM  Vitals:   12/03/19 1059  BP: 124/70  Pulse: 78  Temp: 97.6 F (36.4 C)  TempSrc: Oral  Weight: 221 lb (100.2 kg)  Height: 6\' 2"  (1.88 m)   Body mass index is 28.37  kg/m.  Generalized: Well developed, in no acute distress   Neurological examination  Mentation: Alert oriented to time, place, history taking. Follows all commands speech and language fluent Cranial nerve II-XII: Pupils were equal round reactive to light. Extraocular movements were full, visual field were full on confrontational test. Facial sensation and strength were normal. Head turning and shoulder shrug  were normal and symmetric. Motor: The motor testing reveals 5 over 5 strength of all 4 extremities. Good symmetric motor tone is noted  throughout.  Sensory: Sensory testing is intact to soft touch on all 4 extremities. No evidence of extinction is noted.  Coordination: Cerebellar testing reveals good finger-nose-finger and heel-to-shin bilaterally.  Gait and station: Gait is normal. Tandem gait is normal.  Reflexes: Deep tendon reflexes are symmetric and normal bilaterally.   DIAGNOSTIC DATA (LABS, IMAGING, TESTING) - I reviewed patient records, labs, notes, testing and imaging myself where available.  Lab Results  Component Value Date   WBC 8.0 12/26/2018   HGB 13.9 12/26/2018   HCT 42.8 12/26/2018   MCV 90.9 12/26/2018   PLT 262 12/26/2018      Component Value Date/Time   NA 139 12/26/2018 1240   K 3.8 12/26/2018 1240   CL 105 12/26/2018 1240   CO2 23 12/26/2018 1240   GLUCOSE 124 (H) 12/26/2018 1240   BUN 20 12/26/2018 1240   CREATININE 1.19 12/26/2018 1240   CALCIUM 9.7 12/26/2018 1240   PROT 8.1 12/26/2018 1240   ALBUMIN 4.7 12/26/2018 1240   AST 34 12/26/2018 1240   ALT 94 (H) 12/26/2018 1240   ALKPHOS 138 (H) 12/26/2018 1240   BILITOT 0.6 12/26/2018 1240   GFRNONAA >60 12/26/2018 1240   GFRAA >60 12/26/2018 1240   No results found for: CHOL, HDL, LDLCALC, LDLDIRECT, TRIG, CHOLHDL No results found for: HGBA1C No results found for: VITAMINB12 No results found for: TSH  ASSESSMENT AND PLAN 43 y.o. year old male  has a past medical history of Common migraine  (09/28/2017), Gout, Headache syndrome (09/28/2017), Hypothyroid, and Migraine. here with:  1.  Right occipital headache, possible migraine  His headaches are under fair control, but even occurring 1-2 times a month, forces him to miss work.  He has found Topamax to be beneficial.  We will go up on the Topamax taking 125 mg at bedtime, if he does well, we will continue to increase to 150 mg at bedtime.  He will remain on baclofen and Maxalt.  He does find this combination to be helpful.  We may also try nortriptyline, or gabapentin in the future.  We will need to assess for adverse effects of Topamax at increased doses.  He will follow-up in 6 months or sooner if needed.  I did advise if his symptoms worsen or if he develops any new symptoms he should let us know.   I spent 15 minutes with the patient. 50% of this time was spent discussing his plan of care.  Thomas Burgess, AGNP-C, DNP 12/03/2019, 12:14 PM [facility] [address] [place], [place] [address] [phone]

## 2019-12-05 NOTE — Progress Notes (Signed)
I have read the note, and I agree with the clinical assessment and plan.  Mansur Patti K Orien Mayhall   

## 2020-03-06 ENCOUNTER — Encounter: Payer: Self-pay | Admitting: Emergency Medicine

## 2020-03-06 ENCOUNTER — Other Ambulatory Visit: Payer: Self-pay

## 2020-03-06 ENCOUNTER — Ambulatory Visit
Admission: EM | Admit: 2020-03-06 | Discharge: 2020-03-06 | Disposition: A | Discharge status: Home / Self Care | Payer: BC Managed Care – PPO | Attending: Emergency Medicine | Admitting: Emergency Medicine

## 2020-03-06 DIAGNOSIS — M25561 Pain in right knee: Secondary | ICD-10-CM | POA: Diagnosis not present

## 2020-03-06 MED ORDER — NAPROXEN 500 MG PO TABS: 500.0000 mg | ORAL_TABLET | Freq: Two times a day (BID) | ORAL | 0 refills | Status: DC

## 2020-03-06 NOTE — Discharge Instructions (Signed)
Recommend RICE: rest, ice, compression, elevation as needed for pain.    Heat therapy (hot compress, warm wash red, hot showers, etc.) can help relax muscles and soothe muscle aches. Cold therapy (ice packs) can be used to help swelling both after injury and after prolonged use of areas of chronic pain/aches.  For pain: Take naproxen, and Tylenol if needed.  Important to follow-up with your orthopedic doctor for further evaluation/management.

## 2020-03-06 NOTE — ED Notes (Signed)
Patient able to ambulate independently  

## 2020-03-06 NOTE — ED Triage Notes (Signed)
Pt presents to [facility] for assessment of right knee pain starting 2 days ago.  Hx of Gout, but states this does not feel the same.  Patient denies known injury.

## 2020-03-06 NOTE — ED Provider Notes (Signed)
[facility]    CSN: [id number] Arrival date & time: 03/06/20  1344      History   Chief Complaint Chief Complaint  Patient presents with  . Knee Pain    HPI Thomas Burgess is a 44 y.o. male with history of migraine, gout, hypothyroidism presenting for right knee pain X 2 days.  Patient states this feels different than gout.  Reports compliance with allopurinol as outlined below.  Denies recent increase in physical activity, fall, trauma to area.  No hip, ankle, back pain.  Patient is s/p remote right knee surgery: Followed routinely by Ortho, though states he was unable to get appointment today.  States he is received intra-articular injections in the past which helped.   Past Medical History:  Diagnosis Date  . Common migraine 09/28/2017  . Gout   . Headache syndrome 09/28/2017   Right occipital  . Hypothyroid   . Migraine     Patient Active Problem List   Diagnosis Date Noted  . Periumbilical abdominal pain 12/17/2018  . Headache syndrome 09/28/2017  . Common migraine 09/28/2017  . Primary localized osteoarthritis of left knee 09/19/2017    Past Surgical History:  Procedure Laterality Date  . FEMUR SURGERY    . LAPAROSCOPIC APPENDECTOMY N/A 12/18/2018   Procedure: APPENDECTOMY LAPAROSCOPIC;  Surgeon: Almond Lint, MD;  Location: [facility];  Service: General;  Laterality: N/A;  . 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 LEG SURGERY     Removed hardware       Home Medications    Prior to Admission medications   Medication Sig Start Date End Date Taking? Authorizing Provider  Baclofen 5 MG TABS Take 5 mg by mouth as needed. Take 1 tablet at onset of headache pain, may repeat dose in 24 hours if needed 12/03/19  Yes Glean Salvo, NP  rizatriptan (MAXALT) 10 MG tablet TAKE 1 TABLET BY MOUTH THREE TIMES DAILYAS NEEDED FOR MIGRAINE. MAY REPEAT IN 2 HOURS IF NEEDED. 05/09/19  Yes York Spaniel, MD  topiramate (TOPAMAX) 50 MG tablet Take 2.5  tablets at bedtime 12/03/19  Yes Glean Salvo, NP  acetaminophen (TYLENOL) 500 MG tablet Take 1,000 mg by mouth 2 (two) times daily as needed for moderate pain.    [provider]  allopurinol (ZYLOPRIM) 100 MG tablet Take 100 mg by mouth at bedtime.     [provider]  FLUoxetine (PROZAC) 20 MG tablet Take 20 mg by mouth daily.    [provider]  levothyroxine (SYNTHROID, LEVOTHROID) 112 MCG tablet Take 112 mcg by mouth See admin instructions. Takes 112 mcg every day, except Wednesday take 224 mcg.    [provider]  meloxicam (MOBIC) 7.5 MG tablet Take 7.5 mg by mouth daily.    [provider]  naproxen (NAPROSYN) 500 MG tablet Take 1 tablet (500 mg total) by mouth 2 (two) times daily. 03/06/20   Hall-Potvin, [place], PA-C    Family History Family History  Problem Relation Age of Onset  . Congestive Heart Failure Mother   . Diabetes Mother   . Kidney failure Father   . Brain cancer Cousin     Social History Social History   Tobacco Use  . Smoking status: Never Smoker  . Smokeless tobacco: Never Used  Substance Use Topics  . Alcohol use: No  . Drug use: No     Allergies   Patient has no known allergies.   Review of Systems  As per HPI   Physical Exam Triage Vital Signs ED Triage Vitals [03/06/20 1349]  Enc Vitals Group     BP 131/79     Pulse Rate 90     Resp 18     Temp 97.8 F (36.6 C)     Temp Source Temporal     SpO2 98 %     Weight      Height      Head Circumference      Peak Flow      Pain Score 7     Pain Loc      Pain Edu?      Excl. in GC?    No data found.  Updated Vital Signs BP 131/79 (BP Location: Left Arm)   Pulse 90   Temp 97.8 F (36.6 C) (Temporal)   Resp 18   SpO2 98%   Visual Acuity Right Eye Distance:   Left Eye Distance:   Bilateral Distance:    Right Eye Near:   Left Eye Near:    Bilateral Near:     Physical Exam Constitutional:      General: He is not in acute  distress. HENT:     Head: Normocephalic and atraumatic.  Eyes:     General: No scleral icterus.    Pupils: Pupils are equal, round, and reactive to light.  Cardiovascular:     Rate and Rhythm: Normal rate.  Pulmonary:     Effort: Pulmonary effort is normal. No respiratory distress.     Breath sounds: No wheezing.  Musculoskeletal:     Right hip: Normal.     Left hip: Normal.     Right knee: Swelling present. No deformity, effusion, erythema, ecchymosis, bony tenderness or crepitus. Normal range of motion. Tenderness present. Normal alignment and normal patellar mobility.     Left knee: Normal.     Right ankle: Normal.     Left ankle: Normal.  Skin:    Coloration: Skin is not jaundiced or pale.  Neurological:     Mental Status: He is alert and oriented to person, place, and time.     Sensory: No sensory deficit.     Motor: No weakness.     Coordination: Coordination normal.     Gait: Gait abnormal.     Deep Tendon Reflexes: Reflexes normal.     Comments: Gait mildly antalgic, favoring right      UC Treatments / Results  Labs (all labs ordered are listed, but only abnormal results are displayed) Labs Reviewed - No data to display  EKG   Radiology No results found.  Procedures Procedures (including critical care time)  Medications Ordered in UC Medications - No data to display  Initial Impression / Assessment and Plan / UC Course  I have reviewed the triage vital signs and the nursing notes.  Pertinent labs & imaging results that were available during my care of the patient were reviewed by me and considered in my medical decision making (see chart for details).     Patient was without injury or bony tenderness: Radiography deferred.  Will trial NSAID, Ace wrap, RICE and follow-up with Ortho for further evaluation/management.  Return precautions discussed, patient verbalized understanding and is agreeable to plan. Final Clinical Impressions(s) / UC Diagnoses    Final diagnoses:  Acute pain of right knee     Discharge Instructions     Recommend RICE: rest, ice, compression, elevation as needed for pain.    Heat therapy (hot  compress, warm wash red, hot showers, etc.) can help relax muscles and soothe muscle aches. Cold therapy (ice packs) can be used to help swelling both after injury and after prolonged use of areas of chronic pain/aches.  For pain: Take naproxen, and Tylenol if needed.  Important to follow-up with your orthopedic doctor for further evaluation/management.    ED Prescriptions    Medication Sig Dispense Auth. Provider   naproxen (NAPROSYN) 500 MG tablet Take 1 tablet (500 mg total) by mouth 2 (two) times daily. 30 tablet Hall-Potvin, [place], PA-C     PDMP not reviewed this encounter.   Hall-Potvin, [place], [place] 03/06/20 1447

## 2020-03-07 ENCOUNTER — Other Ambulatory Visit: Payer: Self-pay

## 2020-03-07 ENCOUNTER — Ambulatory Visit: Payer: Self-pay

## 2020-03-07 ENCOUNTER — Ambulatory Visit (INDEPENDENT_AMBULATORY_CARE_PROVIDER_SITE_OTHER): Payer: BC Managed Care – PPO | Admitting: Orthopaedic Surgery

## 2020-03-07 ENCOUNTER — Encounter: Payer: Self-pay | Admitting: Orthopaedic Surgery

## 2020-03-07 VITALS — Ht 74.0 in | Wt 230.0 lb

## 2020-03-07 DIAGNOSIS — M25561 Pain in right knee: Secondary | ICD-10-CM

## 2020-03-07 MED ORDER — COLCHICINE 0.6 MG PO TABS: 0.6000 mg | ORAL_TABLET | Freq: Two times a day (BID) | ORAL | 0 refills | Status: DC | PRN

## 2020-03-07 MED ORDER — PREDNISONE 10 MG (21) PO TBPK: ORAL_TABLET | ORAL | 0 refills | Status: DC

## 2020-03-07 NOTE — Progress Notes (Signed)
Office Visit Note   Patient: Thomas Burgess           Date of Birth: 1976/07/09           MRN: [id number] Visit Date: 03/07/2020              Requested by: Kaleen Mask, MD [address] [place],  [place] [address] PCP: Kaleen Mask, MD   Assessment & Plan: Visit Diagnoses:  1. Acute pain of right knee     Plan: My impression is right knee pain suspect gout versus prepatellar bursitis we will place him on crutches and knee immobilizer and colchicine and prednisone Dosepak.  Work for a week.  Patient instructed to follow-up in a week if he is not better.  Follow-Up Instructions: Return if symptoms worsen or fail to improve.   Orders:  Orders Placed This Encounter  Procedures  . XR KNEE 3 VIEW RIGHT   Meds ordered this encounter  Medications  . predniSONE (STERAPRED UNI-PAK 21 TAB) 10 MG (21) TBPK tablet    Sig: Take as directed    Dispense:  21 tablet    Refill:  0  . colchicine 0.6 MG tablet    Sig: Take 1 tablet (0.6 mg total) by mouth 2 (two) times daily as needed.    Dispense:  20 tablet    Refill:  0      Procedures: No procedures performed   Clinical Data: No additional findings.   Subjective: Chief Complaint  Patient presents with  . Right Knee - Pain    Thomas Burgess is a 44 year old gentleman who I have seen on a regular basis who comes in for acute onset of right knee pain in the last 3 days without any known injuries.  He does have a history of gout.  He has pain and stiffness mainly around the patella.  He denies any swelling.  He has been taking his regular allopurinol.  Denies any constitutional symptoms   Review of Systems  Constitutional: Negative.   All other systems reviewed and are negative.    Objective: Vital Signs: Ht 6\' 2"  (1.88 m)   Wt 230 lb (104.3 kg)   BMI 29.53 kg/m   Physical Exam Vitals and nursing note reviewed.  Constitutional:      Appearance: He is well-developed.  Pulmonary:     Effort: Pulmonary  effort is normal.  Abdominal:     Palpations: Abdomen is soft.  Skin:    General: Skin is warm.  Neurological:     Mental Status: He is alert and oriented to person, place, and time.  Psychiatric:        Behavior: Behavior normal.        Thought Content: Thought content normal.        Judgment: Judgment normal.     Ortho Exam Right knee shows a trace effusion.  He has painful range of motion and guarding.  Collaterals are grossly intact.  He has tenderness throughout the prepatellar bursa.  There is no fluctuance or cellulitis or redness. Specialty Comments:  No specialty comments available.  Imaging: XR KNEE 3 VIEW RIGHT  Result Date: 03/07/2020 Status post hardware fixation of tibial plateau fracture without complications.  No acute or structural abnormalities.    PMFS History: Patient Active Problem List   Diagnosis Date Noted  . Periumbilical abdominal pain 12/17/2018  . Headache syndrome 09/28/2017  . Common migraine 09/28/2017  . Primary localized osteoarthritis of left knee 09/19/2017  Past Medical History:  Diagnosis Date  . Common migraine 09/28/2017  . Gout   . Headache syndrome 09/28/2017   Right occipital  . Hypothyroid   . Migraine     Family History  Problem Relation Age of Onset  . Congestive Heart Failure Mother   . Diabetes Mother   . Kidney failure Father   . Brain cancer Cousin     Past Surgical History:  Procedure Laterality Date  . FEMUR SURGERY    . LAPAROSCOPIC APPENDECTOMY N/A 12/18/2018   Procedure: APPENDECTOMY LAPAROSCOPIC;  Surgeon: Stark Klein, MD;  Location: [facility];  Service: General;  Laterality: N/A;  . 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 LEG SURGERY     Removed hardware   Social History   Occupational History  . Occupation: [employer]  Tobacco Use  . Smoking status: Never Smoker  . Smokeless tobacco: Never Used  Substance and Sexual Activity  . Alcohol use: No  . Drug use: No  . Sexual activity: Not  Currently    Birth control/protection: None

## 2020-03-14 ENCOUNTER — Ambulatory Visit: Payer: BC Managed Care – PPO | Admitting: Orthopaedic Surgery

## 2020-03-27 ENCOUNTER — Other Ambulatory Visit: Payer: Self-pay | Admitting: Orthopaedic Surgery

## 2020-06-02 ENCOUNTER — Encounter: Payer: Self-pay | Admitting: Neurology

## 2020-06-02 ENCOUNTER — Ambulatory Visit (INDEPENDENT_AMBULATORY_CARE_PROVIDER_SITE_OTHER): Payer: BC Managed Care – PPO | Admitting: Neurology

## 2020-06-02 VITALS — BP 135/85 | HR 75 | Ht 73.0 in | Wt 234.0 lb

## 2020-06-02 DIAGNOSIS — G4489 Other headache syndrome: Secondary | ICD-10-CM

## 2020-06-02 MED ORDER — TOPIRAMATE 50 MG PO TABS: ORAL_TABLET | ORAL | 6 refills | Status: DC

## 2020-06-02 MED ORDER — BACLOFEN 5 MG PO TABS: 5.0000 mg | ORAL_TABLET | ORAL | 1 refills | Status: DC | PRN

## 2020-06-02 MED ORDER — RIZATRIPTAN BENZOATE 10 MG PO TABS: ORAL_TABLET | ORAL | 5 refills | Status: DC

## 2020-06-02 MED ORDER — EMGALITY 120 MG/ML ~~LOC~~ SOAJ: 240.0000 mg | SUBCUTANEOUS | 0 refills | Status: DC

## 2020-06-02 MED ORDER — EMGALITY 120 MG/ML ~~LOC~~ SOAJ: 120.0000 mg | SUBCUTANEOUS | 11 refills | Status: DC

## 2020-06-02 NOTE — Progress Notes (Signed)
PATIENT: Thomas Burgess DOB: January 22, 1976  REASON FOR VISIT: follow up HISTORY FROM: patient  HISTORY OF PRESENT ILLNESS: Today 06/02/20  Thomas Burgess 44 year old male with history of headaches and possible occipital neuralgia.  He remains on Topamax, baclofen, and Maxalt.  His headaches are always in the right occipital area, radiate forward, associated with nausea, and photophobia.  He does have history of underlying migraines, unclear if these are migraines or occipital neuralgia.  On average, 2 significant headaches a month, 2-4 mild headaches.  For headache, will initially take baclofen, usually will take away, if needed will resort to Maxalt, has excellent benefit with this.  On average, he misses 2 days of work a week.  Headaches always occur in the evening.  He has to go to work early in the morning, the headache will resolve around lunchtime.  He tried an increase in Topamax 125 mg, did not see much change, noted more side effect with memory and drowsiness.  He presents today for evaluation unaccompanied.  HISTORY 12/03/2019 SS: Thomas Burgess is a 44 year old male with history of headaches and possible occipital neuralgia.  He remains on Topamax 100 mg at bedtime.  He is also taking baclofen and Maxalt. He reports when he develops headaches, it is in the evening.  At that time, he will take baclofen, then Maxalt if headache is still there and go to bed.  He reports this may happen 1-2 times a month.  Unfortunately, he has to go into work around 3 AM, and the headache is still present, forcing him to miss work.  He reports the next day by lunch his headache is gone.  He reports his headache always occurs in the right occipital area.  He reports Topamax has been helpful for his headaches.  He does report he notices some memory side effect, but does not impact is daily activity.  He denies typical migraine features such as photophobia, phonophobia, nausea, or vomiting.  He presents today for follow-up  unaccompanied.  REVIEW OF SYSTEMS: Out of a complete 14 system review of symptoms, the patient complains only of the following symptoms, and all other reviewed systems are negative.  Headache  ALLERGIES: No Known Allergies  HOME MEDICATIONS: Outpatient Medications Prior to Visit  Medication Sig Dispense Refill   acetaminophen (TYLENOL) 500 MG tablet Take 1,000 mg by mouth 2 (two) times daily as needed for moderate pain.     allopurinol (ZYLOPRIM) 100 MG tablet Take 100 mg by mouth at bedtime.      FLUoxetine (PROZAC) 20 MG tablet Take 20 mg by mouth daily.     levothyroxine (SYNTHROID, LEVOTHROID) 112 MCG tablet Take 112 mcg by mouth See admin instructions. Takes 112 mcg every day, except Wednesday take 224 mcg.     Baclofen 5 MG TABS Take 5 mg by mouth as needed. Take 1 tablet at onset of headache pain, may repeat dose in 24 hours if needed 30 tablet 1   rizatriptan (MAXALT) 10 MG tablet TAKE 1 TABLET BY MOUTH THREE TIMES DAILYAS NEEDED FOR MIGRAINE. MAY REPEAT IN 2 HOURS IF NEEDED. 10 tablet 5   topiramate (TOPAMAX) 50 MG tablet Take 2.5 tablets at bedtime 90 tablet 6   colchicine 0.6 MG tablet Take 1 tablet (0.6 mg total) by mouth 2 (two) times daily as needed. 20 tablet 0   meloxicam (MOBIC) 7.5 MG tablet Take 7.5 mg by mouth daily.     naproxen (NAPROSYN) 500 MG tablet Take 1 tablet (500 mg  total) by mouth 2 (two) times daily. 30 tablet 0   predniSONE (STERAPRED UNI-PAK 21 TAB) 10 MG (21) TBPK tablet Take as directed 21 tablet 0   No facility-administered medications prior to visit.    PAST MEDICAL HISTORY: Past Medical History:  Diagnosis Date   Common migraine 09/28/2017   Gout    Headache syndrome 09/28/2017   Right occipital   Hypothyroid    Migraine     PAST SURGICAL HISTORY: Past Surgical History:  Procedure Laterality Date   FEMUR SURGERY     LAPAROSCOPIC APPENDECTOMY N/A 12/18/2018   Procedure: APPENDECTOMY LAPAROSCOPIC;  Surgeon: Almond Lint, MD;  Location: [facility];  Service: General;  Laterality: N/A;   LEG SURGERY Bilateral 2006   Broke both legs   LEG SURGERY     Removed hardware    FAMILY HISTORY: Family History  Problem Relation Age of Onset   Congestive Heart Failure Mother    Diabetes Mother    Kidney failure Father    Brain cancer Cousin     SOCIAL HISTORY: Social History   Socioeconomic History   Marital status: Married    Spouse name: Not on file   Number of children: 1   Years of education: 12   Highest education level: Not on file  Occupational History   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  Other Topics Concern   Not on file  Social History Narrative   Lives with wife, daughter   Caffeine use: soft drinks daily      Right handed    Social Determinants of Health   Financial Resource Strain:    Difficulty of Paying Living Expenses:   Food Insecurity:    Worried About Programme researcher, broadcasting/film/video in the Last Year:    Barista in the Last Year:   Transportation Needs:    Freight forwarder (Medical):    Lack of Transportation (Non-Medical):   Physical Activity:    Days of Exercise per Week:    Minutes of Exercise per Session:   Stress:    Feeling of Stress :   Social Connections:    Frequency of Communication with Friends and Family:    Frequency of Social Gatherings with Friends and Family:    Attends Religious Services:    Active Member of Clubs or Organizations:    Attends Banker Meetings:    Marital Status:   Intimate Partner Violence:    Fear of Current or Ex-Partner:    Emotionally Abused:    Physically Abused:    Sexually Abused:    PHYSICAL EXAM  Vitals:   06/02/20 0913  BP:  135/85  Pulse: 75  Weight: 234 lb (106.1 kg)  Height: 6\' 1"  (1.854 m)   Body mass index is 30.87 kg/m.  Generalized: Well developed, in no acute distress   Neurological examination  Mentation: Alert oriented to time, place, history taking. Follows all commands speech and language fluent Cranial nerve II-XII: Pupils were equal round reactive to light. Extraocular movements were full, visual field were full on confrontational test. Facial sensation and strength were normal. Head turning and shoulder shrug  were normal and symmetric. Motor: The motor testing reveals 5  over 5 strength of all 4 extremities. Good symmetric motor tone is noted throughout.  Sensory: Sensory testing is intact to soft touch on all 4 extremities. No evidence of extinction is noted.  Coordination: Cerebellar testing reveals good finger-nose-finger and heel-to-shin bilaterally.  Gait and station: Gait is normal.  Reflexes: Deep tendon reflexes are symmetric and normal bilaterally.   DIAGNOSTIC DATA (LABS, IMAGING, TESTING) - I reviewed patient records, labs, notes, testing and imaging myself where available.  Lab Results  Component Value Date   WBC 8.0 12/26/2018   HGB 13.9 12/26/2018   HCT 42.8 12/26/2018   MCV 90.9 12/26/2018   PLT 262 12/26/2018      Component Value Date/Time   NA 139 12/26/2018 1240   K 3.8 12/26/2018 1240   CL 105 12/26/2018 1240   CO2 23 12/26/2018 1240   GLUCOSE 124 (H) 12/26/2018 1240   BUN 20 12/26/2018 1240   CREATININE 1.19 12/26/2018 1240   CALCIUM 9.7 12/26/2018 1240   PROT 8.1 12/26/2018 1240   ALBUMIN 4.7 12/26/2018 1240   AST 34 12/26/2018 1240   ALT 94 (H) 12/26/2018 1240   ALKPHOS 138 (H) 12/26/2018 1240   BILITOT 0.6 12/26/2018 1240   GFRNONAA >60 12/26/2018 1240   GFRAA >60 12/26/2018 1240   No results found for: CHOL, HDL, LDLCALC, LDLDIRECT, TRIG, CHOLHDL No results found for: HGBA1C No results found for: VITAMINB12 No results found for: TSH  ASSESSMENT  AND PLAN 44 y.o. year old male  has a past medical history of Common migraine (09/28/2017), Gout, Headache syndrome (09/28/2017), Hypothyroid, and Migraine. here with:  1.  Right occipital headache, possible migraine headache  I am going to give him a trial on Emgality for migraine vs occipital neuralgia headaches.  He does report migraine features, photophobia, and nausea.  He has excellent benefit with Maxalt. He will go back to 100 mg Topamax, he does have side effect, if his headaches respond well to [facility], we may continue to lower the dose of Topamax.  We also discussed possibly trying extended release preparation of Topamax if needed.  On average having anywhere from 2-6 headaches a month.  He is missing work as result.  He will remain on baclofen and Maxalt for acute headache.  I did not offer nortriptyline or gabapentin due to underlying drowsiness secondary to Topamax.  He will send me a progress report in 3 to 4 months with update on Emgality benefit.  I will see him back in 6 months or sooner if needed.  We have never done a trial of an occipital nerve block, we talked about we may try this in the future.  I spent 30 minutes of face-to-face and non-face-to-face time with patient.  This included previsit chart review, lab review, study review, order entry, electronic health record documentation, patient education.  Butler Denmark, AGNP-C, DNP 06/02/2020, 10:04 AM [facility] [address], [place], [place] [address] [phone]

## 2020-06-02 NOTE — Patient Instructions (Addendum)
Try Emgality injection for migraine prevention  Initial is 240 mg 1st month injection  Subsequent injection is 120 mg every 30 days  Continue other medications  Send progress report with how you are doing  See you back in 6 months   Galcanezumab injection What is this medicine? GALCANEZUMAB (gal ka NEZ ue mab) is used to prevent migraines and treat cluster headaches. This medicine may be used for other purposes; ask your health care provider or pharmacist if you have questions. COMMON BRAND NAME(S): Emgality What should I tell my health care provider before I take this medicine? They need to know if you have any of these conditions:  an unusual or allergic reaction to galcanezumab, other medicines, foods, dyes, or preservatives  pregnant or trying to get pregnant  breast-feeding How should I use this medicine? This medicine is for injection under the skin. You will be taught how to prepare and give this medicine. Use exactly as directed. Take your medicine at regular intervals. Do not take your medicine more often than directed. It is important that you put your used needles and syringes in a special sharps container. Do not put them in a trash can. If you do not have a sharps container, call your pharmacist or healthcare provider to get one. Talk to your pediatrician regarding the use of this medicine in children. Special care may be needed. Overdosage: If you think you have taken too much of this medicine contact a poison control center or emergency room at once. NOTE: This medicine is only for you. Do not share this medicine with others. What if I miss a dose? If you miss a dose, take it as soon as you can. If it is almost time for your next dose, take only that dose. Do not take double or extra doses. What may interact with this medicine? Interactions are not expected. This list may not describe all possible interactions. Give your health care provider a list of all the medicines,  herbs, non-prescription drugs, or dietary supplements you use. Also tell them if you smoke, drink alcohol, or use illegal drugs. Some items may interact with your medicine. What should I watch for while using this medicine? Tell your doctor or healthcare professional if your symptoms do not start to get better or if they get worse. What side effects may I notice from receiving this medicine? Side effects that you should report to your doctor or health care professional as soon as possible:  allergic reactions like skin rash, itching or hives, swelling of the face, lips, or tongue Side effects that usually do not require medical attention (report these to your doctor or health care professional if they continue or are bothersome):  pain, redness, or irritation at site where injected This list may not describe all possible side effects. Call your doctor for medical advice about side effects. You may report side effects to FDA at 1-800-FDA-1088. Where should I keep my medicine? Keep out of the reach of children. You will be instructed on how to store this medicine. Throw away any unused medicine after the expiration date on the label. NOTE: This sheet is a summary. It may not cover all possible information. If you have questions about this medicine, talk to your doctor, pharmacist, or health care provider.  2020 Elsevier/Gold Standard (2018-05-24 12:03:23)

## 2020-06-03 NOTE — Progress Notes (Signed)
I have read the note, and I agree with the clinical assessment and plan.  Tequia Wolman K Senya Hinzman   

## 2020-06-04 ENCOUNTER — Telehealth: Payer: Self-pay

## 2020-06-04 NOTE — Telephone Encounter (Signed)
A PA has been sent/faxed via [facility] for [facility]   (Key: [id number]) - [id number] Emgality 120MG /ML auto-injectors (migraine)     Status: Sent to Plan  Created: June 14th, 2021 June 16th, 2021  Sent: June 16th, 2021

## 2020-06-05 ENCOUNTER — Encounter: Payer: Self-pay | Admitting: Neurology

## 2020-07-12 ENCOUNTER — Other Ambulatory Visit: Payer: Self-pay | Admitting: Neurology

## 2020-09-18 ENCOUNTER — Other Ambulatory Visit: Payer: Self-pay | Admitting: Neurology

## 2020-09-22 ENCOUNTER — Encounter: Payer: Self-pay | Admitting: Neurology

## 2020-10-31 ENCOUNTER — Encounter: Payer: Self-pay | Admitting: Physician Assistant

## 2020-10-31 ENCOUNTER — Ambulatory Visit (INDEPENDENT_AMBULATORY_CARE_PROVIDER_SITE_OTHER): Payer: Worker's Compensation | Admitting: Physician Assistant

## 2020-10-31 ENCOUNTER — Ambulatory Visit: Payer: Self-pay

## 2020-10-31 DIAGNOSIS — M25572 Pain in left ankle and joints of left foot: Secondary | ICD-10-CM

## 2020-10-31 NOTE — Progress Notes (Signed)
Office Visit Note   Patient: Thomas Burgess           Date of Birth: [birth year]           MRN: [id number] Visit Date: 10/31/2020              Requested by: Kaleen Mask, MD [address] [place],  [place] [address] PCP: Kaleen Mask, MD  No chief complaint on file.     HPI: This is a pleasant 44 year old gentleman who presents with a chief complaint of left ankle pain.  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  He had a cord unknowingly wrapped around his ankle that was stuck under the tire of the forklift.  When he moves the forklift his ankle was pulled laterally and his foot remained in a planted position.  He had immediate pain and felt a loud pop.  He describes his pain mostly over the lateral ankle but also some over the medial and medial joint line.  He denies any specific foot pain.  Assessment & Plan: Visit Diagnoses:  1. Pain in left ankle and joints of left foot     Plan: Consistent with a Weber B ankle fracture.  He does have tenderness medially and over the medial malleolus.  X-rays do demonstrate a displaced fracture of the distal fibula.  I consulted with Dr. Lajoyce Corners by phone he reviewed the x-rays patient would be best served with an open reduction internal fixation next Wednesday.  I have instructed him to elevate his leg over the weekend.  We reviewed the risks of surgery as well as the recovery he would like to move forward  Follow-Up Instructions: No follow-ups on file.   Ortho Exam  Patient is alert, oriented, no adenopathy, well-dressed, normal affect, normal respiratory effort.  Lungs clear heart regular rate and rhythm  Left ankle moderate amount of soft tissue swelling no blistering.  Pulses are dopplered and are triphasic.  No tenderness over the Lisfranc area no ecchymosis on the plantar foot.  He is exquisitely tender over the lateral malleolus.  Also tenderness over the medial malleolus and the medial joint  line.  He has a positive squeeze test.  Compartments are soft and compressible.   Imaging: No results found. No images are attached to the encounter.  Labs: No results found for: HGBA1C, ESRSEDRATE, CRP, LABURIC, REPTSTATUS, GRAMSTAIN, CULT, LABORGA   Lab Results  Component Value Date   ALBUMIN 4.7 12/26/2018   ALBUMIN 4.4 12/16/2018    No results found for: MG No results found for: VD25OH  No results found for: PREALBUMIN CBC EXTENDED Latest Ref Rng & Units 12/26/2018 12/18/2018 12/16/2018  WBC 4.0 - 10.5 K/uL 8.0 5.8 10.0  RBC 4.22 - 5.81 MIL/uL 4.71 4.33 4.45  HGB 13.0 - 17.0 g/dL 37.1 12.8(L) 13.6  HCT 39 - 52 % 42.8 40.8 41.5  PLT 150 - 400 K/uL 262 172 222     There is no height or weight on file to calculate BMI.  Orders:  Orders Placed This Encounter  Procedures  . XR Ankle Complete Left   No orders of the defined types were placed in this encounter.    Procedures: No procedures performed  Clinical Data: No additional findings.  ROS:  All other systems negative, except as noted in the HPI. Review of Systems  Objective: Vital Signs: There were no vitals taken for this visit.  Specialty Comments:  No  specialty comments available.  PMFS History: Patient Active Problem List   Diagnosis Date Noted  . Periumbilical abdominal pain 12/17/2018  . Headache syndrome 09/28/2017  . Common migraine 09/28/2017  . Primary localized osteoarthritis of left knee 09/19/2017   Past Medical History:  Diagnosis Date  . Common migraine 09/28/2017  . Gout   . Headache syndrome 09/28/2017   Right occipital  . Hypothyroid   . Migraine     Family History  Problem Relation Age of Onset  . Congestive Heart Failure Mother   . Diabetes Mother   . Kidney failure Father   . Brain cancer Cousin     Past Surgical History:  Procedure Laterality Date  . FEMUR SURGERY    . LAPAROSCOPIC APPENDECTOMY N/A 12/18/2018   Procedure: APPENDECTOMY LAPAROSCOPIC;  Surgeon:  Almond Lint, MD;  Location: [facility];  Service: General;  Laterality: N/A;  . 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 LEG SURGERY     Removed hardware   Social History   Occupational History  . Occupation: [employer]  Tobacco Use  . Smoking status: Never Smoker  . Smokeless tobacco: Never Used  Vaping Use  . Vaping Use: Never used  Substance and Sexual Activity  . Alcohol use: No  . Drug use: No  . Sexual activity: Not Currently    Birth control/protection: None

## 2020-11-03 ENCOUNTER — Telehealth: Payer: Self-pay

## 2020-11-03 ENCOUNTER — Other Ambulatory Visit: Payer: Self-pay | Admitting: Physician Assistant

## 2020-11-03 MED ORDER — OXYCODONE-ACETAMINOPHEN 5-325 MG PO TABS
1.0000 | ORAL_TABLET | ORAL | 0 refills | Status: DC | PRN
Start: 2020-11-03 — End: 2020-11-06

## 2020-11-03 NOTE — Progress Notes (Signed)
-------------    SDW INSTRUCTIONS:  Your procedure is scheduled on 11/05/20.  Report to [facility] Main Entrance "A" at 1100 A.M., and check in at the Admitting office.  Call this number if you have problems the morning of surgery: [phone]   Remember: Do not eat or drink after midnight the night before your surgery  Take these medicines the morning of surgery with A SIP OF WATER: FLUoxetine (PROZAC)  levothyroxine (SYNTHROID, LEVOTHROID) oxyCODONE-acetaminophen (PERCOCET/ROXICET) if needed rizatriptan (MAXALT)   As of today, STOP taking any Aspirin (unless otherwise instructed by your surgeon), Aleve, Naproxen, Ibuprofen, Motrin, Advil, Goody's, BC's, all herbal medications, fish oil, and all vitamins.    The Morning of Surgery  Do not wear jewelry  Do not wear lotions, powders, colognes, or deodorant   Men may shave face and neck.  Do not bring valuables to the hospital.  [facility] is not responsible for any belongings or valuables.  If you are a smoker, DO NOT Smoke 24 hours prior to surgery  If you wear a CPAP at night please bring your mask the morning of surgery   Remember that you must have someone to transport you home after your surgery, and remain with you for 24 hours if you are discharged the same day.  Please bring cases for contacts, glasses, hearing aids, dentures or bridgework because it cannot be worn into surgery.   Leave your suitcase in the car.  After surgery it may be brought to your room.  For patients admitted to the hospital, discharge time will be determined by your treatment team.  Patients discharged the day of surgery will not be allowed to drive home.    Special instructions:   [facility]- Preparing For Surgery  Oral Hygiene is also important to reduce your risk of infection.  Remember - BRUSH YOUR TEETH THE MORNING OF SURGERY WITH YOUR REGULAR TOOTHPASTE  Please follow these instructions carefully.   1. Shower the NIGHT BEFORE  SURGERY and the MORNING OF SURGERY with DIAL Soap.   2. Wash thoroughly, paying special attention to the area where your surgery will be performed.  3. Thoroughly rinse your body with warm water from the neck down.  4. Pat yourself dry with a CLEAN TOWEL.  5. Wear CLEAN PAJAMAS to bed the night before surgery  6. Place CLEAN SHEETS on your bed the night of your first shower and DO NOT SLEEP WITH PETS.  7. Wear comfortable clothes the morning of surgery.    Day of Surgery:  Please shower the morning of surgery with the DIAL soap Do not apply any deodorants/lotions. Please wear clean clothes to the hospital/surgery center.   Remember to brush your teeth WITH YOUR REGULAR TOOTHPASTE.   Please read over the following fact sheets that you were given.  Patient denies shortness of breath, fever, cough and chest pain.

## 2020-11-03 NOTE — Telephone Encounter (Signed)
I called pt to advise of message below. He states that he is not scheduled for surgery Wednesday as he has not received approval from work comp. He stated that he made his own appt here and did get approval from his case manager Mrs. Sibyl Parr with [employer] phone # [phone] and said that he needs to have someone fax over notes so that the surgery can be approved.

## 2020-11-03 NOTE — Telephone Encounter (Signed)
Please see message below

## 2020-11-03 NOTE — Telephone Encounter (Signed)
Patient called stated his workers comp.case worker needs appointment notes from his visit on Friday. Call back:[phone]

## 2020-11-03 NOTE — Telephone Encounter (Signed)
I called him in Percocet ? He needs to use sparingly as he is having surgery on Wed

## 2020-11-03 NOTE — Telephone Encounter (Signed)
Do you know where this office visit note needs to go? Please see message below.

## 2020-11-03 NOTE — Telephone Encounter (Signed)
Patient called he came on Friday to see [place], he stated [facility] didn't send in rx he is requesting acetaminophen which is what the hospital gave him or original rx mary anne was going to send in. Call back:[phone]

## 2020-11-04 ENCOUNTER — Other Ambulatory Visit: Payer: Self-pay | Admitting: Physician Assistant

## 2020-11-04 ENCOUNTER — Encounter (HOSPITAL_COMMUNITY): Payer: Self-pay | Admitting: Orthopedic Surgery

## 2020-11-04 ENCOUNTER — Other Ambulatory Visit: Payer: Self-pay

## 2020-11-04 ENCOUNTER — Other Ambulatory Visit (HOSPITAL_COMMUNITY)
Admission: RE | Admit: 2020-11-04 | Discharge: 2020-11-04 | Disposition: A | Discharge status: Home / Self Care | Payer: BC Managed Care – PPO | Source: Ambulatory Visit | Attending: Orthopedic Surgery | Admitting: Orthopedic Surgery

## 2020-11-04 DIAGNOSIS — Z01812 Encounter for preprocedural laboratory examination: Secondary | ICD-10-CM | POA: Insufficient documentation

## 2020-11-04 DIAGNOSIS — Z20822 Contact with and (suspected) exposure to covid-19: Secondary | ICD-10-CM | POA: Insufficient documentation

## 2020-11-04 LAB — SARS CORONAVIRUS 2 (TAT 6-24 HRS): SARS Coronavirus 2: NEGATIVE

## 2020-11-04 NOTE — Progress Notes (Signed)
Mr. Thomas Burgess denies chest pain or shortness of breath. Patient was tested today for Covid and has been in quarantine since tested.  Mr Thomas Burgess voiced understanding of instructions.  Patient is not able to pick Pre-  Op Ensure.

## 2020-11-05 ENCOUNTER — Ambulatory Visit (HOSPITAL_COMMUNITY): Payer: Worker's Compensation | Admitting: Certified Registered Nurse Anesthetist

## 2020-11-05 ENCOUNTER — Encounter (HOSPITAL_COMMUNITY): Payer: Self-pay | Admitting: Orthopedic Surgery

## 2020-11-05 ENCOUNTER — Encounter (HOSPITAL_COMMUNITY)
Admission: AD | Disposition: A | Discharge status: Home / Self Care | Payer: Self-pay | Source: Home / Self Care | Attending: Orthopedic Surgery

## 2020-11-05 ENCOUNTER — Ambulatory Visit (HOSPITAL_COMMUNITY): Payer: Worker's Compensation

## 2020-11-05 ENCOUNTER — Other Ambulatory Visit: Payer: Self-pay

## 2020-11-05 ENCOUNTER — Observation Stay (HOSPITAL_COMMUNITY)
Admission: AD | Admit: 2020-11-05 | Discharge: 2020-11-06 | Disposition: A | Discharge status: Home / Self Care | Payer: Worker's Compensation | Attending: Orthopedic Surgery | Admitting: Orthopedic Surgery

## 2020-11-05 DIAGNOSIS — S8292XA Unspecified fracture of left lower leg, initial encounter for closed fracture: Principal | ICD-10-CM | POA: Insufficient documentation

## 2020-11-05 DIAGNOSIS — Y990 Civilian activity done for income or pay: Secondary | ICD-10-CM | POA: Diagnosis not present

## 2020-11-05 DIAGNOSIS — M25572 Pain in left ankle and joints of left foot: Secondary | ICD-10-CM | POA: Diagnosis present

## 2020-11-05 DIAGNOSIS — Y9389 Activity, other specified: Secondary | ICD-10-CM | POA: Insufficient documentation

## 2020-11-05 DIAGNOSIS — Z23 Encounter for immunization: Secondary | ICD-10-CM | POA: Insufficient documentation

## 2020-11-05 DIAGNOSIS — S82892A Other fracture of left lower leg, initial encounter for closed fracture: Secondary | ICD-10-CM

## 2020-11-05 DIAGNOSIS — X501XXA Overexertion from prolonged static or awkward postures, initial encounter: Secondary | ICD-10-CM | POA: Diagnosis not present

## 2020-11-05 DIAGNOSIS — S99912A Unspecified injury of left ankle, initial encounter: Secondary | ICD-10-CM | POA: Diagnosis present

## 2020-11-05 DIAGNOSIS — Z9889 Other specified postprocedural states: Secondary | ICD-10-CM

## 2020-11-05 DIAGNOSIS — E039 Hypothyroidism, unspecified: Secondary | ICD-10-CM | POA: Diagnosis not present

## 2020-11-05 DIAGNOSIS — Z8781 Personal history of (healed) traumatic fracture: Secondary | ICD-10-CM

## 2020-11-05 HISTORY — DX: Family history of other specified conditions: Z84.89

## 2020-11-05 HISTORY — DX: Personal history of urinary calculi: Z87.442

## 2020-11-05 HISTORY — DX: Dyspnea, unspecified: R06.00

## 2020-11-05 HISTORY — PX: ORIF ANKLE FRACTURE: SHX5408

## 2020-11-05 LAB — POCT I-STAT, CHEM 8
BUN: 20 mg/dL (ref 6–20)
Calcium, Ion: 1.18 mmol/L (ref 1.15–1.40)
Chloride: 100 mmol/L (ref 98–111)
Creatinine, Ser: 1.4 mg/dL — ABNORMAL HIGH (ref 0.61–1.24)
Glucose, Bld: 95 mg/dL (ref 70–99)
HCT: 40 % (ref 39.0–52.0)
Hemoglobin: 13.6 g/dL (ref 13.0–17.0)
Potassium: 4.7 mmol/L (ref 3.5–5.1)
Sodium: 139 mmol/L (ref 135–145)
TCO2: 26 mmol/L (ref 22–32)

## 2020-11-05 LAB — CBC
HCT: 44.5 % (ref 39.0–52.0)
Hemoglobin: 14.5 g/dL (ref 13.0–17.0)
MCH: 30.5 pg (ref 26.0–34.0)
MCHC: 32.6 g/dL (ref 30.0–36.0)
MCV: 93.7 fL (ref 80.0–100.0)
Platelets: 214 10*3/uL (ref 150–400)
RBC: 4.75 MIL/uL (ref 4.22–5.81)
RDW: 13.1 % (ref 11.5–15.5)
WBC: 6.5 10*3/uL (ref 4.0–10.5)
nRBC: 0 % (ref 0.0–0.2)

## 2020-11-05 LAB — SURGICAL PCR SCREEN
MRSA, PCR: NEGATIVE
Staphylococcus aureus: POSITIVE — AB

## 2020-11-05 SURGERY — OPEN REDUCTION INTERNAL FIXATION (ORIF) ANKLE FRACTURE
Anesthesia: General | Site: Ankle | Laterality: Left

## 2020-11-05 MED ORDER — LIDOCAINE 2% (20 MG/ML) 5 ML SYRINGE
INTRAMUSCULAR | Status: AC
  Filled 2020-11-05: qty 10

## 2020-11-05 MED ORDER — MIDAZOLAM HCL 2 MG/2ML IJ SOLN: 2.0000 mg | Freq: Once | INTRAMUSCULAR | Status: AC

## 2020-11-05 MED ORDER — FENTANYL CITRATE (PF) 100 MCG/2ML IJ SOLN
INTRAMUSCULAR | Status: AC
  Administered 2020-11-05: 100 ug via INTRAVENOUS
  Filled 2020-11-05: qty 2

## 2020-11-05 MED ORDER — PROPOFOL 10 MG/ML IV BOLUS
INTRAVENOUS | Status: DC | PRN
  Administered 2020-11-05: 200 mg via INTRAVENOUS

## 2020-11-05 MED ORDER — FENTANYL CITRATE (PF) 100 MCG/2ML IJ SOLN
INTRAMUSCULAR | Status: DC | PRN
  Administered 2020-11-05 (×2): 50 ug via INTRAVENOUS

## 2020-11-05 MED ORDER — FENTANYL CITRATE (PF) 100 MCG/2ML IJ SOLN: 25.0000 ug | INTRAMUSCULAR | Status: DC | PRN

## 2020-11-05 MED ORDER — MIDAZOLAM HCL 2 MG/2ML IJ SOLN
INTRAMUSCULAR | Status: AC
  Administered 2020-11-05: 2 mg via INTRAVENOUS
  Filled 2020-11-05: qty 2

## 2020-11-05 MED ORDER — METOCLOPRAMIDE HCL 5 MG/ML IJ SOLN: 5.0000 mg | Freq: Three times a day (TID) | INTRAMUSCULAR | Status: DC | PRN

## 2020-11-05 MED ORDER — NAPROXEN 250 MG PO TABS: 500.0000 mg | ORAL_TABLET | Freq: Two times a day (BID) | ORAL | Status: DC | PRN

## 2020-11-05 MED ORDER — DEXAMETHASONE SODIUM PHOSPHATE 10 MG/ML IJ SOLN
INTRAMUSCULAR | Status: DC | PRN
  Administered 2020-11-05: 10 mg via INTRAVENOUS

## 2020-11-05 MED ORDER — FENTANYL CITRATE (PF) 100 MCG/2ML IJ SOLN: 100.0000 ug | Freq: Once | INTRAMUSCULAR | Status: AC

## 2020-11-05 MED ORDER — FENTANYL CITRATE (PF) 250 MCG/5ML IJ SOLN
INTRAMUSCULAR | Status: AC
  Filled 2020-11-05: qty 5

## 2020-11-05 MED ORDER — ONDANSETRON HCL 4 MG/2ML IJ SOLN: 4.0000 mg | Freq: Four times a day (QID) | INTRAMUSCULAR | Status: DC | PRN

## 2020-11-05 MED ORDER — LACTATED RINGERS IV SOLN: INTRAVENOUS | Status: DC

## 2020-11-05 MED ORDER — INFLUENZA VAC SPLIT QUAD 0.5 ML IM SUSY
0.5000 mL | PREFILLED_SYRINGE | INTRAMUSCULAR | Status: AC
  Administered 2020-11-06: 0.5 mL via INTRAMUSCULAR
  Filled 2020-11-05: qty 0.5

## 2020-11-05 MED ORDER — ONDANSETRON HCL 4 MG/2ML IJ SOLN
INTRAMUSCULAR | Status: AC
  Filled 2020-11-05: qty 2

## 2020-11-05 MED ORDER — LEVOTHYROXINE SODIUM 112 MCG PO TABS
112.0000 ug | ORAL_TABLET | ORAL | Status: DC
  Administered 2020-11-06: 112 ug via ORAL
  Filled 2020-11-05: qty 1

## 2020-11-05 MED ORDER — LEVOTHYROXINE SODIUM 112 MCG PO TABS: 224.0000 ug | ORAL_TABLET | ORAL | Status: DC

## 2020-11-05 MED ORDER — FLUOXETINE HCL 20 MG PO CAPS
40.0000 mg | ORAL_CAPSULE | Freq: Every day | ORAL | Status: DC
  Administered 2020-11-06: 40 mg via ORAL
  Filled 2020-11-05 (×3): qty 2

## 2020-11-05 MED ORDER — ASPIRIN EC 325 MG PO TBEC
325.0000 mg | DELAYED_RELEASE_TABLET | Freq: Every day | ORAL | Status: DC
  Administered 2020-11-05 – 2020-11-06 (×2): 325 mg via ORAL
  Filled 2020-11-05 (×2): qty 1

## 2020-11-05 MED ORDER — OXYCODONE HCL 5 MG PO TABS: 10.0000 mg | ORAL_TABLET | ORAL | Status: DC | PRN

## 2020-11-05 MED ORDER — CHLORHEXIDINE GLUCONATE CLOTH 2 % EX PADS: 6.0000 | MEDICATED_PAD | Freq: Every day | CUTANEOUS | Status: DC

## 2020-11-05 MED ORDER — SODIUM CHLORIDE 0.9 % IV SOLN: INTRAVENOUS | Status: DC

## 2020-11-05 MED ORDER — ACETAMINOPHEN 325 MG PO TABS
ORAL_TABLET | ORAL | Status: AC
  Filled 2020-11-05: qty 2

## 2020-11-05 MED ORDER — DOCUSATE SODIUM 100 MG PO CAPS
100.0000 mg | ORAL_CAPSULE | Freq: Two times a day (BID) | ORAL | Status: DC
  Administered 2020-11-05 – 2020-11-06 (×2): 100 mg via ORAL
  Filled 2020-11-05 (×2): qty 1

## 2020-11-05 MED ORDER — ASPIRIN EC 325 MG PO TBEC: 325.0000 mg | DELAYED_RELEASE_TABLET | Freq: Every day | ORAL | 0 refills | Status: DC

## 2020-11-05 MED ORDER — OXYCODONE-ACETAMINOPHEN 5-325 MG PO TABS: 1.0000 | ORAL_TABLET | ORAL | 0 refills | Status: DC | PRN

## 2020-11-05 MED ORDER — DEXAMETHASONE SODIUM PHOSPHATE 10 MG/ML IJ SOLN
INTRAMUSCULAR | Status: AC
  Filled 2020-11-05: qty 1

## 2020-11-05 MED ORDER — METOCLOPRAMIDE HCL 5 MG PO TABS: 5.0000 mg | ORAL_TABLET | Freq: Three times a day (TID) | ORAL | Status: DC | PRN

## 2020-11-05 MED ORDER — CHLORHEXIDINE GLUCONATE 0.12 % MT SOLN
15.0000 mL | Freq: Once | OROMUCOSAL | Status: AC
  Administered 2020-11-05: 15 mL via OROMUCOSAL
  Filled 2020-11-05: qty 15

## 2020-11-05 MED ORDER — OXYCODONE-ACETAMINOPHEN 5-325 MG PO TABS: 1.0000 | ORAL_TABLET | ORAL | Status: DC | PRN

## 2020-11-05 MED ORDER — ONDANSETRON HCL 4 MG/2ML IJ SOLN
INTRAMUSCULAR | Status: DC | PRN
  Administered 2020-11-05: 4 mg via INTRAVENOUS

## 2020-11-05 MED ORDER — CEFAZOLIN SODIUM-DEXTROSE 2-4 GM/100ML-% IV SOLN
2.0000 g | Freq: Four times a day (QID) | INTRAVENOUS | Status: AC
  Administered 2020-11-05 – 2020-11-06 (×3): 2 g via INTRAVENOUS
  Filled 2020-11-05 (×3): qty 100

## 2020-11-05 MED ORDER — ONDANSETRON HCL 4 MG PO TABS: 4.0000 mg | ORAL_TABLET | Freq: Four times a day (QID) | ORAL | Status: DC | PRN

## 2020-11-05 MED ORDER — 0.9 % SODIUM CHLORIDE (POUR BTL) OPTIME
TOPICAL | Status: DC | PRN
  Administered 2020-11-05: 1000 mL

## 2020-11-05 MED ORDER — ORAL CARE MOUTH RINSE: 15.0000 mL | Freq: Once | OROMUCOSAL | Status: AC

## 2020-11-05 MED ORDER — HYDROMORPHONE HCL 1 MG/ML IJ SOLN: 0.5000 mg | INTRAMUSCULAR | Status: DC | PRN

## 2020-11-05 MED ORDER — MUPIROCIN 2 % EX OINT
1.0000 "application " | TOPICAL_OINTMENT | Freq: Two times a day (BID) | CUTANEOUS | Status: DC
  Administered 2020-11-05 – 2020-11-06 (×2): 1 via NASAL
  Filled 2020-11-05: qty 22

## 2020-11-05 MED ORDER — ALLOPURINOL 100 MG PO TABS
100.0000 mg | ORAL_TABLET | Freq: Every day | ORAL | Status: DC
  Administered 2020-11-05: 100 mg via ORAL
  Filled 2020-11-05: qty 1

## 2020-11-05 MED ORDER — ACETAMINOPHEN 325 MG PO TABS
325.0000 mg | ORAL_TABLET | Freq: Four times a day (QID) | ORAL | Status: DC | PRN
  Administered 2020-11-05: 325 mg via ORAL
  Filled 2020-11-05: qty 2

## 2020-11-05 MED ORDER — CEFAZOLIN SODIUM-DEXTROSE 2-4 GM/100ML-% IV SOLN
2.0000 g | INTRAVENOUS | Status: AC
  Administered 2020-11-05: 2 g via INTRAVENOUS
  Filled 2020-11-05: qty 100

## 2020-11-05 MED ORDER — ACETAMINOPHEN 325 MG PO TABS
650.0000 mg | ORAL_TABLET | Freq: Once | ORAL | Status: AC
  Administered 2020-11-05: 650 mg via ORAL

## 2020-11-05 MED ORDER — LIDOCAINE 2% (20 MG/ML) 5 ML SYRINGE
INTRAMUSCULAR | Status: DC | PRN
  Administered 2020-11-05: 50 mg via INTRAVENOUS

## 2020-11-05 SURGICAL SUPPLY — 51 items
BANDAGE ESMARK 6X9 LF (GAUZE/BANDAGES/DRESSINGS) IMPLANT
BIT DRILL 110X2.5XQCK CNCT (BIT) IMPLANT
BIT DRILL 2.5 (BIT) ×2
BIT DRILL STD 2.0MM (DRILL) IMPLANT
BIT DRL 110X2.5XQCK CNCT (BIT) ×1
BNDG CMPR 9X6 STRL LF SNTH (GAUZE/BANDAGES/DRESSINGS)
BNDG COHESIVE 4X5 TAN STRL (GAUZE/BANDAGES/DRESSINGS) ×2 IMPLANT
BNDG ESMARK 6X9 LF (GAUZE/BANDAGES/DRESSINGS)
BNDG GAUZE ELAST 4 BULKY (GAUZE/BANDAGES/DRESSINGS) ×2 IMPLANT
COVER SURGICAL LIGHT HANDLE (MISCELLANEOUS) ×2 IMPLANT
COVER WAND RF STERILE (DRAPES) ×2 IMPLANT
DRAPE DERMATAC (DRAPES) ×3 IMPLANT
DRAPE OEC MINIVIEW 54X84 (DRAPES) IMPLANT
DRAPE U-SHAPE 47X51 STRL (DRAPES) ×2 IMPLANT
DRILL STANDARD 2.0MM (DRILL) ×2
DRSG ADAPTIC 3X8 NADH LF (GAUZE/BANDAGES/DRESSINGS) ×2 IMPLANT
DRSG PAD ABDOMINAL 8X10 ST (GAUZE/BANDAGES/DRESSINGS) ×2 IMPLANT
DURAPREP 26ML APPLICATOR (WOUND CARE) ×2 IMPLANT
ELECT REM PT RETURN 9FT ADLT (ELECTROSURGICAL) ×2
ELECTRODE REM PT RTRN 9FT ADLT (ELECTROSURGICAL) ×1 IMPLANT
GAUZE SPONGE 4X4 12PLY STRL (GAUZE/BANDAGES/DRESSINGS) ×2 IMPLANT
GLOVE BIOGEL PI IND STRL 9 (GLOVE) ×1 IMPLANT
GLOVE BIOGEL PI INDICATOR 9 (GLOVE) ×1
GLOVE SURG ORTHO 9.0 STRL STRW (GLOVE) ×2 IMPLANT
GOWN STRL REUS W/ TWL XL LVL3 (GOWN DISPOSABLE) ×3 IMPLANT
GOWN STRL REUS W/TWL XL LVL3 (GOWN DISPOSABLE) ×6
KIT BASIN OR (CUSTOM PROCEDURE TRAY) ×2 IMPLANT
KIT TURNOVER KIT B (KITS) ×2 IMPLANT
MANIFOLD NEPTUNE II (INSTRUMENTS) ×2 IMPLANT
NS IRRIG 1000ML POUR BTL (IV SOLUTION) ×2 IMPLANT
PACK ORTHO EXTREMITY (CUSTOM PROCEDURE TRAY) ×2 IMPLANT
PAD ARMBOARD 7.5X6 YLW CONV (MISCELLANEOUS) ×4 IMPLANT
PLATE LOCK DIST FIB 2.7X80 LT (Plate) ×1 IMPLANT
PREVENA RESTOR AXIOFORM 29X28 (GAUZE/BANDAGES/DRESSINGS) ×1 IMPLANT
SCREW 3.5X12 (Screw) ×4 IMPLANT
SCREW BN 2.7X12X3.5XST NS (Screw) IMPLANT
SCREW LOCK 10X2.7XNS ELB (Screw) IMPLANT
SCREW LOCK 14X2.7X NS (Screw) IMPLANT
SCREW LOCK ~~LOC~~ ST 2.7X14 (Screw) ×1 IMPLANT
SCREW LOCKING 2.7X10MM (Screw) ×2 IMPLANT
SCREW LOCKING 2.7X14 (Screw) ×2 IMPLANT
SCREW PERI 3.5X14MM W/2.7 (Screw) ×1 IMPLANT
STAPLER VISISTAT 35W (STAPLE) IMPLANT
SUCTION FRAZIER HANDLE 10FR (MISCELLANEOUS) ×2
SUCTION TUBE FRAZIER 10FR DISP (MISCELLANEOUS) ×1 IMPLANT
SUT ETHILON 2 0 PSLX (SUTURE) ×4 IMPLANT
SUT VIC AB 2-0 CT1 27 (SUTURE) ×2
SUT VIC AB 2-0 CT1 TAPERPNT 27 (SUTURE) ×1 IMPLANT
TOWEL GREEN STERILE (TOWEL DISPOSABLE) ×2 IMPLANT
TOWEL GREEN STERILE FF (TOWEL DISPOSABLE) ×2 IMPLANT
TUBE CONNECTING 12X1/4 (SUCTIONS) ×2 IMPLANT

## 2020-11-05 NOTE — Transfer of Care (Signed)
Immediate Anesthesia Transfer of Care Note  Patient: Thomas Burgess  Procedure(s) Performed: OPEN REDUCTION INTERNAL FIXATION (ORIF) LEFT ANKLE FRACTURE (Left Ankle)  Patient Location: PACU  Anesthesia Type:General and Regional  Level of Consciousness: drowsy and patient cooperative  Airway & Oxygen Therapy: Patient Spontanous Breathing and Patient connected to face mask oxygen  Post-op Assessment: Report given to RN and Post -op Vital signs reviewed and stable  Post vital signs: Reviewed and stable  Last Vitals:  Vitals Value Taken Time  BP 131/76 11/05/20 1329  Temp    Pulse 80 11/05/20 1330  Resp 17 11/05/20 1330  SpO2 95 % 11/05/20 1330  Vitals shown include unvalidated device data.  Last Pain:  Vitals:   11/05/20 1132  TempSrc:   PainSc: 8       Patients Stated Pain Goal: 4 (11/05/20 1132)  Complications: No complications documented.

## 2020-11-05 NOTE — Anesthesia Postprocedure Evaluation (Signed)
Anesthesia Post Note  Patient: Thomas Burgess  Procedure(s) Performed: OPEN REDUCTION INTERNAL FIXATION (ORIF) LEFT ANKLE FRACTURE (Left Ankle)     Patient location during evaluation: PACU Anesthesia Type: General Level of consciousness: awake Pain management: pain level controlled Vital Signs Assessment: post-procedure vital signs reviewed and stable Respiratory status: spontaneous breathing Cardiovascular status: stable Postop Assessment: no apparent nausea or vomiting Anesthetic complications: no   No complications documented.  Last Vitals:  Vitals:   11/05/20 1445 11/05/20 1459  BP: (!) 146/104 (!) 158/109  Pulse: 70 82  Resp: 16 20  Temp:    SpO2: 100% 100%    Last Pain:  Vitals:   11/05/20 1459  TempSrc:   PainSc: 0-No pain                 Gehrig Patras

## 2020-11-05 NOTE — Anesthesia Preprocedure Evaluation (Signed)
Anesthesia Evaluation  Patient identified by MRN, date of birth, ID band Patient awake    Reviewed: Allergy & Precautions, NPO status , Patient's Chart, lab work & pertinent test results  Airway Mallampati: II  TM Distance: >3 FB     Dental   Pulmonary shortness of breath,    breath sounds clear to auscultation       Cardiovascular negative cardio ROS   Rhythm:Regular Rate:Normal     Neuro/Psych  Headaches,    GI/Hepatic negative GI ROS, Neg liver ROS,   Endo/Other  Hypothyroidism   Renal/GU negative Renal ROS     Musculoskeletal  (+) Arthritis ,   Abdominal   Peds  Hematology   Anesthesia Other Findings   Reproductive/Obstetrics                             Anesthesia Physical Anesthesia Plan  ASA: II  Anesthesia Plan: General   Post-op Pain Management:  Regional for Post-op pain   Induction: Intravenous  PONV Risk Score and Plan: 2 and Ondansetron, Dexamethasone and Midazolam  Airway Management Planned: LMA  Additional Equipment:   Intra-op Plan:   Post-operative Plan:   Informed Consent: I have reviewed the patients History and Physical, chart, labs and discussed the procedure including the risks, benefits and alternatives for the proposed anesthesia with the patient or authorized representative who has indicated his/her understanding and acceptance.     Dental advisory given  Plan Discussed with: CRNA and Anesthesiologist  Anesthesia Plan Comments:         Anesthesia Quick Evaluation

## 2020-11-05 NOTE — Plan of Care (Signed)

## 2020-11-05 NOTE — Evaluation (Signed)
Occupational Therapy Evaluation Patient Details Name: Thomas Burgess MRN: [id number] DOB: 03-Apr-1976 Today's Date: 11/05/2020    History of Present Illness 44 y.o. male with a diagnosis of Left Ankle Fracture who failed conservative measures and elected for surgical management.  Unremarkable PMH.  Underwent OPEN REDUCTION INTERNAL FIXATION (ORIF) LEFT ANKLE FRACTURE 11/17.   Clinical Impression   Patient admitted with the above diagnosis and procedure.  He has been NWB in a [place] and using crutches for two weeks now, and has been doing well.  He needed no assist for bed mobility, came to a stand with ease, was able to use RW throughout the room without assist or cueing.  He is open to a shower seat at home.  He is currently staying with a friend and his spouse, he is recently divorced from his spouse.  All questions answered, and no acute or post acute OT needs exist.  Recommend follow up as prescribed by MD.      Follow Up Recommendations  No OT follow up;Supervision - Intermittent    Equipment Recommendations  Tub/shower seat    Recommendations for Other Services       Precautions / Restrictions Precautions Precautions: Fall Required Braces or Orthoses: Other Brace Other Brace: Cam Hanner Restrictions Weight Bearing Restrictions: Yes LLE Weight Bearing: Non weight bearing      Mobility Bed Mobility Overal bed mobility: Independent                  Transfers Overall transfer level: Modified independent Equipment used: Rolling [place] (2 wheeled)                  Balance Overall balance assessment: No apparent balance deficits (not formally assessed)                                         ADL either performed or assessed with clinical judgement   ADL Overall ADL's : Modified independent                                       General ADL Comments: Able to bathe and dress himself from modified position sit/stand.   Mod I with RW to toilet and functional mobility in the room.  Ind with bed mobility.     Vision Baseline Vision/History: Wears glasses Patient Visual Report: No change from baseline       Perception     Praxis      Pertinent Vitals/Pain Pain Assessment: 0-10 Pain Score: 1  Pain Location: L medial ankle Pain Descriptors / Indicators: Burning Pain Intervention(s): Monitored during session     Hand Dominance Right   Extremity/Trunk Assessment Upper Extremity Assessment Upper Extremity Assessment: Overall WFL for tasks assessed   Lower Extremity Assessment Lower Extremity Assessment: Defer to PT evaluation   Cervical / Trunk Assessment Cervical / Trunk Assessment: Normal   Communication Communication Communication: No difficulties   Cognition Arousal/Alertness: Awake/alert Behavior During Therapy: WFL for tasks assessed/performed Overall Cognitive Status: Within Functional Limits for tasks assessed                                     General Comments   VSS    Exercises  Shoulder Instructions      Home Living Family/patient expects to be discharged to:: Private residence Living Arrangements: Non-relatives/Friends Available Help at Discharge: Friend(s) Type of Home: House Home Access: Level entry     Home Layout: One level     Bathroom Shower/Tub: Chief Strategy Officer: Standard Bathroom Accessibility: Yes How Accessible: Accessible via [place] Home Equipment: Crutches          Prior Functioning/Environment Level of Independence: Independent        Comments: working full time until recent injury 2 weeks prior.        OT Problem List: Pain      OT Treatment/Interventions:      OT Goals(Current goals can be found in the care plan section) Acute Rehab OT Goals Patient Stated Goal: Ready to heal and return to work OT Goal Formulation: With patient Time For Goal Achievement: 11/19/20 Potential to Achieve Goals:  Good  OT Frequency:     Barriers to D/C:  None noted          Co-evaluation              AM-PAC OT "6 Clicks" Daily Activity     Outcome Measure Help from another person eating meals?: None Help from another person taking care of personal grooming?: None Help from another person toileting, which includes using toliet, bedpan, or urinal?: None Help from another person bathing (including washing, rinsing, drying)?: None Help from another person to put on and taking off regular upper body clothing?: None Help from another person to put on and taking off regular lower body clothing?: None 6 Click Score: 24   End of Session Equipment Utilized During Treatment: Gait belt;Rolling [place] Nurse Communication: Mobility status  Activity Tolerance: Patient tolerated treatment well Patient left: in bed;with call bell/phone within reach;with nursing/sitter in room  OT Visit Diagnosis: Pain Pain - Right/Left: Left Pain - part of body: Leg                Time: 8756-4332 OT Time Calculation (min): 21 min Charges:  OT General Charges $OT Visit: 1 Visit OT Evaluation $OT Eval Moderate Complexity: 1 Mod  11/05/2020  Rich, OTR/L  Acute Rehabilitation Services  Office:  [phone]   Suzanna Obey 11/05/2020, 5:40 PM

## 2020-11-05 NOTE — Interval H&P Note (Signed)
History and Physical Interval Note:  11/05/2020 12:00 PM  Thomas Burgess  has presented today for surgery, with the diagnosis of Left Ankle Fracture.  The various methods of treatment have been discussed with the patient and family. After consideration of risks, benefits and other options for treatment, the patient has consented to  Procedure(s): OPEN REDUCTION INTERNAL FIXATION (ORIF) LEFT ANKLE FRACTURE (Left) as a surgical intervention.  The patient's history has been reviewed, patient examined, no change in status, stable for surgery.  I have reviewed the patient's chart and labs.  Questions were answered to the patient's satisfaction.     Nadara Mustard

## 2020-11-05 NOTE — H&P (Signed)
Thomas Burgess is an 44 y.o. male.   Chief Complaint: left Ankle Pain HPI: This is a pleasant 44 year old gentleman who presents with a chief complaint of left ankle pain.  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  He had a cord unknowingly wrapped around his ankle that was stuck under the tire of the forklift.  When he moves the forklift his ankle was pulled laterally and his foot remained in a planted position.  He had immediate pain and felt a loud pop.  He describes his pain mostly over the lateral ankle but also some over the medial and medial joint line.  He denies any specific foot pain.  Past Medical History:  Diagnosis Date  . Common migraine 09/28/2017  . Dyspnea    when walking with crutches  . Family history of adverse reaction to anesthesia    mother/father- nausea  . Gout   . Headache syndrome 09/28/2017   Right occipital  . History of kidney stones    passed  . Hypothyroid   . Migraine    Control Emgatitin    Past Surgical History:  Procedure Laterality Date  . FEMUR SURGERY Left    repair with repair  . FEMUR SURGERY Left    hardware removal  . LAPAROSCOPIC APPENDECTOMY N/A 12/18/2018   Procedure: APPENDECTOMY LAPAROSCOPIC;  Surgeon: Almond Lint, MD;  Location: [facility];  Service: General;  Laterality: N/A;  . 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 LEG SURGERY Left    Removed hardware  . SHOULDER SURGERY Left    Frozen shoulder    Family History  Problem Relation Age of Onset  . Congestive Heart Failure Mother   . Diabetes Mother   . Kidney failure Father   . Brain cancer Cousin    Social History:  reports that he has never smoked. He has never used smokeless tobacco. He reports that he does not drink alcohol and does not use drugs.  Allergies: No Known Allergies  No medications prior to admission.    Results for orders placed or performed during the hospital encounter of 11/04/20 (from the past 48 hour(s))  SARS  CORONAVIRUS 2 (TAT 6-24 HRS) Nasopharyngeal Nasopharyngeal Swab     Status: None   Collection Time: 11/04/20 11:13 AM   Specimen: Nasopharyngeal Swab  Result Value Ref Range   SARS Coronavirus 2 NEGATIVE NEGATIVE    Comment: (NOTE) SARS-CoV-2 target nucleic acids are NOT DETECTED.  The SARS-CoV-2 RNA is generally detectable in upper and lower respiratory specimens during the acute phase of infection. Negative results do not preclude SARS-CoV-2 infection, do not rule out co-infections with other pathogens, and should not be used as the sole basis for treatment or other patient management decisions. Negative results must be combined with clinical observations, patient history, and epidemiological information. The expected result is Negative.  Fact Sheet for Patients: HairSlick.no  Fact Sheet for Healthcare Providers: quierodirigir.com  This test is not yet approved or cleared by the [place] FDA and  has been authorized for detection and/or diagnosis of SARS-CoV-2 by FDA under an Emergency Use Authorization (EUA). This EUA will remain  in effect (meaning this test can be used) for the duration of the COVID-19 declaration under Se ction 564(b)(1) of the Act, 21 U.S.C. section 360bbb-3(b)(1), unless the authorization is terminated or revoked sooner.  Performed at [facility], [address], [place], [place] [address]  No results found.  Review of Systems  All other systems reviewed and are negative.   Height 6\' 1"  (1.854 m), weight 108.9 kg. Physical Exam  Patient is alert, oriented, no adenopathy, well-dressed, normal affect, normal respiratory effort.  Lungs clear heart regular rate and rhythm  Left ankle moderate amount of soft tissue swelling no blistering.  Pulses are dopplered and are triphasic.  No tenderness over the Lisfranc area no ecchymosis on the plantar foot.  He is exquisitely tender over the  lateral malleolus.  Also tenderness over the medial malleolus and the medial joint line.  He has a positive squeeze test.  Compartments are soft and compressible.Heart RRR Lungs Clear Assessment/Plan  1. Pain in left ankle and joints of left foot     Plan: Consistent with a Weber B ankle fracture.  He does have tenderness medially and over the medial malleolus.  X-rays do demonstrate a displaced fracture of the distal fibula.  I consulted with Dr. by phone he reviewed the x-rays patient would be best served with an open reduction internal fixation next Wednesday.  I have instructed him to elevate his leg over the weekend.  We reviewed the risks of surgery as well as the recovery he would like to move forward  Friday [name], PA 11/05/2020, 6:43 AM

## 2020-11-05 NOTE — Op Note (Signed)
11/05/2020  1:30 PM  PATIENT:  Thomas Burgess    PRE-OPERATIVE DIAGNOSIS:  Left Ankle Fracture  POST-OPERATIVE DIAGNOSIS:  Same  PROCEDURE:  OPEN REDUCTION INTERNAL FIXATION (ORIF) LEFT ANKLE FRACTURE  SURGEON:  Nadara Mustard, MD  PHYSICIAN ASSISTANT:None ANESTHESIA:   General  PREOPERATIVE INDICATIONS:  YEE JOSS is a  44 y.o. male with a diagnosis of Left Ankle Fracture who failed conservative measures and elected for surgical management.    The risks benefits and alternatives were discussed with the patient preoperatively including but not limited to the risks of infection, bleeding, nerve injury, cardiopulmonary complications, the need for revision surgery, among others, and the patient was willing to proceed.  OPERATIVE IMPLANTS: Anatomic Biomet plate stainless steel  @ENCIMAGES @  OPERATIVE FINDINGS: Impacted transverse fracture.  This was disimpacted brought out to length a locking plate was used.  This was not amenable for a syndesmotic screw.  C-arm fluoroscopy verified alignment in both AP lateral and oblique planes with restoration of the mortise.  OPERATIVE PROCEDURE: Patient brought the operating room after undergoing a regional block and then underwent a general anesthetic.  After adequate levels anesthesia were obtained patient's left lower extremity was prepped using DuraPrep draped into a sterile field a timeout was called.  A lateral incision was made over the fibula this is carried sharply down to bone subperiosteal dissection was used to identify the fracture.  The fracture was disimpacted cleansed and reduced.  A anatomic plate was placed laterally a proximal compression screw was placed followed by three locking distal screws.  A total of three screws proximally and three screws distally.  The wound was irrigated with normal saline C-arm fluoroscopy verified alignment skin was closed using 2-0 nylon.  Due to the tissue trauma bruising and swelling a axial form wound  VAC was applied this had a good suction fit overwrapped with Covan patient was extubated taken the PACU in stable condition   DISCHARGE PLANNING:  Antibiotic duration: Preoperative antibiotics  Weightbearing: Nonweightbearing on the left  Pain medication: percocet  Dressing care/ Wound VAC: Continue wound VAC for 1 week  Ambulatory devices: Fok or crutches  Discharge to: Home.  Follow-up: In the office 1 week post operative.

## 2020-11-05 NOTE — Anesthesia Procedure Notes (Addendum)
Anesthesia Regional Block: Popliteal block   Pre-Anesthetic Checklist: ,, timeout performed, Correct Patient, Correct Site, Correct Laterality, Correct Procedure, Correct Position, site marked, Risks and benefits discussed,  Surgical consent,  Pre-op evaluation,  At surgeon's request and post-op pain management  Laterality: Left  Prep: chloraprep       Needles:  Injection technique: Single-shot  Needle Type: Stimulator Needle - 80          Additional Needles:   Procedures: Doppler guided, nerve stimulator,,, ultrasound used (permanent image in chart),,,,  Narrative:  Start time: 11/05/2020 12:10 PM End time: 11/05/2020 12:00 PM Injection made incrementally with aspirations every 5 mL.  Performed by: Personally  Anesthesiologist: Dorris Singh, MD

## 2020-11-05 NOTE — Anesthesia Procedure Notes (Signed)
Procedure Name: LMA Insertion Date/Time: 11/05/2020 12:37 PM Performed by: Marny Lowenstein, CRNA Pre-anesthesia Checklist: Patient identified, Emergency Drugs available, Suction available and Patient being monitored Patient Re-evaluated:Patient Re-evaluated prior to induction Oxygen Delivery Method: Circle system utilized Preoxygenation: Pre-oxygenation with 100% oxygen Induction Type: IV induction Ventilation: Mask ventilation without difficulty LMA: LMA inserted LMA Size: 4.0 Number of attempts: 1 Placement Confirmation: positive ETCO2 and breath sounds checked- equal and bilateral Tube secured with: Tape

## 2020-11-06 ENCOUNTER — Encounter (HOSPITAL_COMMUNITY): Payer: Self-pay | Admitting: Orthopedic Surgery

## 2020-11-06 DIAGNOSIS — S8292XA Unspecified fracture of left lower leg, initial encounter for closed fracture: Secondary | ICD-10-CM | POA: Diagnosis not present

## 2020-11-06 NOTE — Progress Notes (Signed)
Thomas Burgess to be discharged home per MD order.  Discussed prescriptions and follow up appointments with the patient. Prescriptions sent to patient's preferred pharmacy & medication list explained in detail. Pt verbalized understanding.  Allergies as of 11/06/2020   No Known Allergies     Medication List    STOP taking these medications   topiramate 50 MG tablet Commonly known as: Topamax     TAKE these medications   allopurinol 100 MG tablet Commonly known as: ZYLOPRIM Take 100 mg by mouth at bedtime.   aspirin EC 325 MG tablet Take 1 tablet (325 mg total) by mouth daily.   Baclofen 5 MG Tabs Take 5 mg by mouth as needed. Take 1 tablet at onset of headache pain, may repeat dose in 24 hours if needed   colchicine 0.6 MG tablet Take 0.6 mg by mouth 2 (two) times daily as needed (gout).   Emgality 120 MG/ML Soaj Generic drug: Galcanezumab-gnlm Inject 120 mg into the skin every 30 (thirty) days.   FLUoxetine 20 MG tablet Commonly known as: PROZAC Take 40 mg by mouth daily.   levothyroxine 112 MCG tablet Commonly known as: SYNTHROID Take 112 mcg by mouth See admin instructions. Takes 112 mcg every day, except Wednesday take 224 mcg.   naproxen 500 MG tablet Commonly known as: NAPROSYN Take 500 mg by mouth 2 (two) times daily as needed for mild pain.   oxyCODONE-acetaminophen 5-325 MG tablet Commonly known as: Percocet Take 1 tablet by mouth every 4 (four) hours as needed. What changed: reasons to take this   rizatriptan 10 MG tablet Commonly known as: MAXALT TAKE 1 TABLET BY MOUTH THREE TIMES DAILYAS NEEDED FOR MIGRAINE. MAY REPEAT IN 2 HOURS IF NEEDED. What changed:   how much to take  how to take this  when to take this  reasons to take this  additional instructions            Durable Medical Equipment  (From admission, onward)         Start     Ordered   11/06/20 1326  For home use only DME Shower stool  Once        11/06/20 1326   11/05/20  0000  For home use only DME Crutches        11/05/20 1336           Discharge Care Instructions  (From admission, onward)         Start     Ordered   11/05/20 0000  Non weight bearing        11/05/20 1329          Vitals:   11/06/20 0844 11/06/20 1203  BP: 113/75 120/72  Pulse: 82 88  Resp: 18 18  Temp: 98 F (36.7 C) 98.1 F (36.7 C)  SpO2: 100% 100%    IV catheter discontinued. Site without signs and symptoms of complications. Dressing and pressure applied. Pt denies pain at this time, does still have numbness to Left lower extremity due to block, CAM boot in place, shower seat has been set up for patient to pick up outpatient. No complaints noted.  An After Visit Summary was printed and given to the patient. Patient escorted via Wheelchair, and Discharged home via private auto.  Johnette Abraham [facility] 11/06/2020 3:21 PM

## 2020-11-06 NOTE — Evaluation (Signed)
Physical Therapy Evaluation & Discharge Patient Details Name: Thomas Burgess MRN: [id number] DOB: [birth year] Today's Date: 11/06/2020   History of Present Illness  Pt is a 44 y.o. male admitted 11/05/20 with L ankle pain after sustaining injury at work 2 weeks prior; imaging revealed fx. S/p L ankle ORIF 11/17. PMH includes gout, BLE sx from prior work injuries.    Clinical Impression  Patient evaluated by Physical Therapy with no further acute PT needs identified. PTA, pt independent, works and lives with friends. Today, pt performed transfer and gait training with bilateral crutches; progressing to mod indep with these, good ability to maintain LLE NWB precautions. Reviewed educ re: precautions, positioning, edema control, DVT prevention, activity recommendations and importance of mobility. All education has been completed and the patient has no further questions. Acute PT is signing off. Thank you for this referral.    Follow Up Recommendations No PT follow up    Equipment Recommendations  Other (comment) (tub bench)    Recommendations for Other Services       Precautions / Restrictions Precautions Precautions: Fall Required Braces or Orthoses: Other Brace Other Brace: Cam Frances Restrictions Weight Bearing Restrictions: Yes LLE Weight Bearing: Non weight bearing      Mobility  Bed Mobility Overal bed mobility: Independent                  Transfers Overall transfer level: Modified independent Equipment used: Crutches                Ambulation/Gait Ambulation/Gait assistance: Modified independent (Device/Increase time) Gait Distance (Feet): 160 Feet Assistive device: Crutches   Gait velocity: Decreased   General Gait Details: Trialled gait training with bilateral axillary crutches; good ability to utilize swing-through gait pattern on RLE while maintaining LLE NWB precautions; mod indep with crutches  Stairs            Wheelchair Mobility     Modified Rankin (Stroke Patients Only)       Balance Overall balance assessment: Needs assistance   Sitting balance-Leahy Scale: Good Sitting balance - Comments: Indep to don CAM Gregg boot sitting EOB     Standing balance-Leahy Scale: Fair Standing balance comment: Can static stand without UE support; dynamic stability improved with UE support to maintain LLE NWB Precautions                             Pertinent Vitals/Pain Pain Assessment: Faces Faces Pain Scale: Hurts little more Pain Location: L ankle Pain Descriptors / Indicators: Discomfort Pain Intervention(s): Monitored during session    Home Living Family/patient expects to be discharged to:: Private residence Living Arrangements: Non-relatives/Friends Available Help at Discharge: Friend(s);Available PRN/intermittently Type of Home: House Home Access: Level entry     Home Layout: One level Home Equipment: Crutches, knee scooter Additional Comments: Plans to d/c to friend's home    Prior Function Level of Independence: Independent         Comments: Drives. Works full time at [employer], educational; was working even with ankle injury that occurred 2 weeks prior.     Hand Dominance   Dominant Hand: Right    Extremity/Trunk Assessment   Upper Extremity Assessment Upper Extremity Assessment: Overall WFL for tasks assessed    Lower Extremity Assessment Lower Extremity Assessment: LLE deficits/detail LLE Deficits / Details: s/p L ankle ORIF, still with numbness and unable to wiggle toes; hip and knee >3/5 throughout    Cervical /  Trunk Assessment Cervical / Trunk Assessment: Normal  Communication   Communication: No difficulties  Cognition Arousal/Alertness: Awake/alert Behavior During Therapy: WFL for tasks assessed/performed Overall Cognitive Status: Within Functional Limits for tasks assessed                                        General Comments      Exercises  General Exercises - Lower Extremity Long Arc Quad: AROM;Left;Seated Hip ABduction/ADduction: AROM;Left Straight Leg Raises: AROM;Left Hip Flexion/Marching: AROM;Left   Assessment/Plan    PT Assessment Patent does not need any further PT services  PT Problem List         PT Treatment Interventions      PT Goals (Current goals can be found in the Care Plan section)  Acute Rehab PT Goals PT Goal Formulation: All assessment and education complete, DC therapy    Frequency     Barriers to discharge        Co-evaluation               AM-PAC PT "6 Clicks" Mobility  Outcome Measure Help needed turning from your back to your side while in a flat bed without using bedrails?: None Help needed moving from lying on your back to sitting on the side of a flat bed without using bedrails?: None Help needed moving to and from a bed to a chair (including a wheelchair)?: None Help needed standing up from a chair using your arms (e.g., wheelchair or bedside chair)?: None Help needed to walk in hospital room?: None Help needed climbing 3-5 steps with a railing? : None 6 Click Score: 24    End of Session   Activity Tolerance: Patient tolerated treatment well Patient left: in chair;with call bell/phone within reach Nurse Communication: Mobility status (RN reports ok with no chair alarm) PT Visit Diagnosis: Other abnormalities of gait and mobility (R26.89)    Time: 9767-3419 PT Time Calculation (min) (ACUTE ONLY): 27 min   Charges:   PT Evaluation $PT Eval Low Complexity: 1 Low PT Treatments $Gait Training: 8-22 mins   Ina Homes, PT, DPT Acute Rehabilitation Services  Pager [phone] Office [phone]  Malachy Chamber 11/06/2020, 8:34 AM

## 2020-11-06 NOTE — Progress Notes (Signed)
Patient is postop day 1 status post ORIF left ankle fracture.  He is sitting up in bed and comfortable he still says the block is in place.  Vital signs stable afebrile.  P will be discharged home today  We will follow up in 1 week

## 2020-11-06 NOTE — Plan of Care (Signed)

## 2020-11-06 NOTE — Discharge Summary (Signed)
Discharge Diagnoses:  Active Problems:   Closed fracture of left ankle   S/P ORIF (open reduction internal fixation) fracture   Left ankle pain   Surgeries: Procedure(s): OPEN REDUCTION INTERNAL FIXATION (ORIF) LEFT ANKLE FRACTURE on 11/05/2020    Consultants:   Discharged Condition: Improved  Hospital Course: Thomas Burgess is an 44 y.o. male who was admitted 11/05/2020 with a chief complaint of left ankle pain, with a final diagnosis of Left Ankle Fracture.  Patient was brought to the operating room on 11/05/2020 and underwent Procedure(s): OPEN REDUCTION INTERNAL FIXATION (ORIF) LEFT ANKLE FRACTURE.    Patient was given perioperative antibiotics:  Anti-infectives (From admission, onward)   Start     Dose/Rate Route Frequency Ordered Stop   11/05/20 1715  ceFAZolin (ANCEF) IVPB 2g/100 mL premix        2 g 200 mL/hr over 30 Minutes Intravenous Every 6 hours 11/05/20 1616 11/06/20 0626   11/05/20 1215  ceFAZolin (ANCEF) IVPB 2g/100 mL premix        2 g 200 mL/hr over 30 Minutes Intravenous On call to O.R. 11/05/20 1118 11/05/20 1308    .  Patient was given sequential compression devices, early ambulation, and aspirin for DVT prophylaxis.  Recent vital signs:  Patient Vitals for the past 24 hrs:  BP Temp Temp src Pulse Resp SpO2 Height Weight  11/06/20 0425 111/76 97.7 F (36.5 C) Oral 74 18 96 % -- --  11/06/20 0019 111/69 97.9 F (36.6 C) Oral 85 18 96 % -- --  11/05/20 1923 117/71 98 F (36.7 C) Oral 92 18 93 % -- --  11/05/20 1800 122/84 98.5 F (36.9 C) Oral 98 18 99 % -- --  11/05/20 1700 122/85 98 F (36.7 C) Oral 97 18 97 % -- --  11/05/20 1605 124/85 (!) 97.5 F (36.4 C) Oral 70 18 100 % 6\' 1"  (1.854 m) 106.8 kg  11/05/20 1523 (!) 137/94 97.6 F (36.4 C) -- 66 19 97 % -- --  11/05/20 1459 (!) 158/109 -- -- 82 20 100 % -- --  11/05/20 1445 (!) 146/104 -- -- 70 16 100 % -- --  11/05/20 1430 126/86 -- -- 83 17 97 % -- --  11/05/20 1415 (!) 144/96 -- -- 69 14  95 % -- --  11/05/20 1400 136/88 -- -- 75 17 92 % -- --  11/05/20 1345 (!) 133/93 -- -- 77 16 93 % -- --  11/05/20 1330 -- -- -- 80 17 95 % -- --  11/05/20 1329 131/76 (!) 97.1 F (36.2 C) -- 79 16 95 % -- --  11/05/20 1050 137/90 98.4 F (36.9 C) Oral 81 18 99 % 6\' 1"  (1.854 m) 106.6 kg  .  Recent laboratory studies: DG MINI C-ARM IMAGE ONLY  Result Date: 11/05/2020 There is no interpretation for this exam.  This order is for images obtained during a surgical procedure.  Please See "Surgeries" Tab for more information regarding the procedure.    Discharge Medications:   Allergies as of 11/06/2020   No Known Allergies     Medication List    STOP taking these medications   topiramate 50 MG tablet Commonly known as: Topamax     TAKE these medications   allopurinol 100 MG tablet Commonly known as: ZYLOPRIM Take 100 mg by mouth at bedtime.   aspirin EC 325 MG tablet Take 1 tablet (325 mg total) by mouth daily.   Baclofen 5 MG Tabs Take 5 mg  by mouth as needed. Take 1 tablet at onset of headache pain, may repeat dose in 24 hours if needed   colchicine 0.6 MG tablet Take 0.6 mg by mouth 2 (two) times daily as needed (gout).   Emgality 120 MG/ML Soaj Generic drug: Galcanezumab-gnlm Inject 120 mg into the skin every 30 (thirty) days.   FLUoxetine 20 MG tablet Commonly known as: PROZAC Take 40 mg by mouth daily.   levothyroxine 112 MCG tablet Commonly known as: SYNTHROID Take 112 mcg by mouth See admin instructions. Takes 112 mcg every day, except Wednesday take 224 mcg.   naproxen 500 MG tablet Commonly known as: NAPROSYN Take 500 mg by mouth 2 (two) times daily as needed for mild pain.   oxyCODONE-acetaminophen 5-325 MG tablet Commonly known as: Percocet Take 1 tablet by mouth every 4 (four) hours as needed. What changed: reasons to take this   rizatriptan 10 MG tablet Commonly known as: MAXALT TAKE 1 TABLET BY MOUTH THREE TIMES DAILYAS NEEDED FOR MIGRAINE.  MAY REPEAT IN 2 HOURS IF NEEDED. What changed:   how much to take  how to take this  when to take this  reasons to take this  additional instructions            Durable Medical Equipment  (From admission, onward)         Start     Ordered   11/05/20 0000  For home use only DME Crutches        11/05/20 1336           Discharge Care Instructions  (From admission, onward)         Start     Ordered   11/05/20 0000  Non weight bearing        11/05/20 1329          Diagnostic Studies: DG MINI C-ARM IMAGE ONLY  Result Date: 11/05/2020 There is no interpretation for this exam.  This order is for images obtained during a surgical procedure.  Please See "Surgeries" Tab for more information regarding the procedure.    Patient benefited maximally from their hospital stay and there were no complications.     Disposition: Discharge disposition: 01-Home or Self Care      Discharge Instructions    Apply cam Converse   Complete by: As directed    Laterality: Left   Call MD / Call 911   Complete by: As directed    If you experience chest pain or shortness of breath, CALL 911 and be transported to the hospital emergency room.  If you develope a fever above 101 F, pus (white drainage) or increased drainage or redness at the wound, or calf pain, call your surgeon's office.   Call MD / Call 911   Complete by: As directed    If you experience chest pain or shortness of breath, CALL 911 and be transported to the hospital emergency room.  If you develope a fever above 101 F, pus (white drainage) or increased drainage or redness at the wound, or calf pain, call your surgeon's office.   Constipation Prevention   Complete by: As directed    Drink plenty of fluids.  Prune juice may be helpful.  You may use a stool softener, such as Colace (over the counter) 100 mg twice a day.  Use MiraLax (over the counter) for constipation as needed.   Constipation Prevention   Complete by:  As directed    Drink plenty of fluids.  Prune juice may be helpful.  You may use a stool softener, such as Colace (over the counter) 100 mg twice a day.  Use MiraLax (over the counter) for constipation as needed.   Diet - low sodium heart healthy   Complete by: As directed    Diet - low sodium heart healthy   Complete by: As directed    Discharge instructions   Complete by: As directed    Elevate Left Ankle. Nonweightbearing. Keep dressing dry. Call office if vac alarms   Discharge instructions   Complete by: As directed    Elevate leg. Nonweightbearing. Call if wound vac beeps or stops working   For home use only DME Crutches   Complete by: As directed    Increase activity slowly as tolerated   Complete by: As directed    Increase activity slowly as tolerated   Complete by: As directed    Negative Pressure Wound Therapy - Incisional   Complete by: As directed    Show patient how to attach prevena.   Negative Pressure Wound Therapy - Incisional   Complete by: As directed    Show patient how to attach vac   Non weight bearing   Complete by: As directed       Follow-up Information    [facility] [place], [place], [place] In 1 week.   Specialty: Orthopedic Surgery Contact information: [address] [place] [address] [phone]                Signed: [place] Mohanad Carsten 11/06/2020, 7:27 AM

## 2020-11-06 NOTE — Plan of Care (Signed)

## 2020-11-12 ENCOUNTER — Ambulatory Visit (INDEPENDENT_AMBULATORY_CARE_PROVIDER_SITE_OTHER): Payer: Worker's Compensation | Admitting: Family

## 2020-11-12 ENCOUNTER — Encounter: Payer: Self-pay | Admitting: Family

## 2020-11-12 VITALS — Ht 73.0 in | Wt 235.0 lb

## 2020-11-12 DIAGNOSIS — S82892D Other fracture of left lower leg, subsequent encounter for closed fracture with routine healing: Secondary | ICD-10-CM

## 2020-11-12 MED ORDER — OXYCODONE-ACETAMINOPHEN 5-325 MG PO TABS: 1.0000 | ORAL_TABLET | Freq: Four times a day (QID) | ORAL | 0 refills | Status: DC | PRN

## 2020-11-12 NOTE — Progress Notes (Signed)
Office Visit Note   Patient: Thomas Burgess           Date of Birth: 09/08/1976           MRN: [id number] Visit Date: 11/12/2020              Requested by: Kaleen Mask, MD [address] [place],  [place] [address] PCP: Kaleen Mask, MD  Chief Complaint  Patient presents with  . Left Ankle - Routine Post Op    11/05/20 ORIF ankle fx       HPI: The patient is a 44 year old gentleman seen status post left open reduction internal fixation of a left ankle fracture on November 17 wound VAC removed today.  Assessment & Plan: Visit Diagnoses:  1. Closed fracture of left ankle with routine healing, subsequent encounter     Plan: Begin daily Dial soap cleansing.  Dry dressings.  Elevate for swelling.  He may shower and get this wet continue the cam Yorke.  Continue nonweightbearing.  Radiographs of the left ankle at follow-up  Follow-Up Instructions: Return in about 22 days (around 12/04/2020).   Ortho Exam  Patient is alert, oriented, no adenopathy, well-dressed, normal affect, normal respiratory effort. Incision well approximated with sutures.  There is scant bloody drainage there is moderate edema of the ankle no erythema odor or sign of infection  Imaging: No results found. No images are attached to the encounter.  Labs: No results found for: HGBA1C, ESRSEDRATE, CRP, LABURIC, REPTSTATUS, GRAMSTAIN, CULT, LABORGA   Lab Results  Component Value Date   ALBUMIN 4.7 12/26/2018   ALBUMIN 4.4 12/16/2018    No results found for: MG No results found for: VD25OH  No results found for: PREALBUMIN CBC EXTENDED Latest Ref Rng & Units 11/05/2020 11/05/2020 12/26/2018  WBC 4.0 - 10.5 K/uL 6.5 - 8.0  RBC 4.22 - 5.81 MIL/uL 4.75 - 4.71  HGB 13.0 - 17.0 g/dL 66.0 63.0 16.0  HCT 39 - 52 % 44.5 40.0 42.8  PLT 150 - 400 K/uL 214 - 262     Body mass index is 31 kg/m.  Orders:  No orders of the defined types were placed in this encounter.  Meds  ordered this encounter  Medications  . oxyCODONE-acetaminophen (PERCOCET) 5-325 MG tablet    Sig: Take 1 tablet by mouth every 6 (six) hours as needed.    Dispense:  42 tablet    Refill:  0     Procedures: No procedures performed  Clinical Data: No additional findings.  ROS:  All other systems negative, except as noted in the HPI. Review of Systems  Objective: Vital Signs: Ht 6\' 1"  (1.854 m)   Wt 235 lb (106.6 kg)   BMI 31.00 kg/m   Specialty Comments:  No specialty comments available.  PMFS History: Patient Active Problem List   Diagnosis Date Noted  . S/P ORIF (open reduction internal fixation) fracture 11/05/2020  . Left ankle pain 11/05/2020  . Closed fracture of left ankle   . Periumbilical abdominal pain 12/17/2018  . Headache syndrome 09/28/2017  . Common migraine 09/28/2017  . Primary localized osteoarthritis of left knee 09/19/2017   Past Medical History:  Diagnosis Date  . Common migraine 09/28/2017  . Dyspnea    when walking with crutches  . Family history of adverse reaction to anesthesia    mother/father- nausea  . Gout   . Headache syndrome 09/28/2017   Right occipital  . History of kidney stones  passed  . Hypothyroid   . Migraine    Control Emgatitin    Family History  Problem Relation Age of Onset  . Congestive Heart Failure Mother   . Diabetes Mother   . Kidney failure Father   . Brain cancer Cousin     Past Surgical History:  Procedure Laterality Date  . FEMUR SURGERY Left    repair with repair  . FEMUR SURGERY Left    hardware removal  . LAPAROSCOPIC APPENDECTOMY N/A 12/18/2018   Procedure: APPENDECTOMY LAPAROSCOPIC;  Surgeon: Almond Lint, MD;  Location: [facility] ORS;  Service: General;  Laterality: N/A;  . 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 LEG SURGERY Left    Removed hardware  . ORIF ANKLE FRACTURE Left 11/05/2020   Procedure: OPEN REDUCTION INTERNAL FIXATION (ORIF) LEFT ANKLE FRACTURE;  Surgeon: Nadara Mustard, MD;  Location: [facility];  Service: Orthopedics;  Laterality: Left;  . SHOULDER SURGERY Left    Frozen shoulder   Social History   Occupational History  . Occupation: [employer]  Tobacco Use  . Smoking status: Never Smoker  . Smokeless tobacco: Never Used  Vaping Use  . Vaping Use: Never used  Substance and Sexual Activity  . Alcohol use: No  . Drug use: No  . Sexual activity: Not Currently    Birth control/protection: None

## 2020-11-19 ENCOUNTER — Ambulatory Visit (INDEPENDENT_AMBULATORY_CARE_PROVIDER_SITE_OTHER): Payer: Worker's Compensation | Admitting: Family

## 2020-11-19 ENCOUNTER — Ambulatory Visit (INDEPENDENT_AMBULATORY_CARE_PROVIDER_SITE_OTHER): Payer: Worker's Compensation

## 2020-11-19 ENCOUNTER — Encounter: Payer: Self-pay | Admitting: Family

## 2020-11-19 DIAGNOSIS — M25572 Pain in left ankle and joints of left foot: Secondary | ICD-10-CM

## 2020-11-19 DIAGNOSIS — S82892D Other fracture of left lower leg, subsequent encounter for closed fracture with routine healing: Secondary | ICD-10-CM

## 2020-11-19 NOTE — Progress Notes (Signed)
PATIENT: Thomas Burgess DOB: Dec 15, 1976  REASON FOR VISIT: follow up HISTORY FROM: patient  HISTORY OF PRESENT ILLNESS: Today 11/20/20 [name] is a 44 year old male with history of headaches and possible occipital neuralgia. Headaches are always on the right occipital area, radiate forward, associated with nausea and photophobia.  Has been on Emgality since July 2021, has had 90% improvement in headaches.  Since then, only 2-3 headaches.  Rarely misses a day of work, before was missing 2 to 3 days a month.  Headaches occur in the evening, he will take Maxalt or baclofen with benefit, but has drowsiness.  He has to be up for work at 3 AM, this is usually why he misses work, but things are much improved.  Unfortunately, he broke his left ankle, required surgery a few weeks ago.  Is recovering on crutches.  He has been able to discontinue Topamax completely. Presents today for evaluation unaccompanied.  HISTORY 06/02/2020 SS: Mr. Shiraishi 44 year old male with history of headaches and possible occipital neuralgia.  He remains on Topamax, baclofen, and Maxalt.  His headaches are always in the right occipital area, radiate forward, associated with nausea, and photophobia.  He does have history of underlying migraines, unclear if these are migraines or occipital neuralgia.  On average, 2 significant headaches a month, 2-4 mild headaches.  For headache, will initially take baclofen, usually will take away, if needed will resort to Maxalt, has excellent benefit with this.  On average, he misses 2 days of work a week.  Headaches always occur in the evening.  He has to go to work early in the morning, the headache will resolve around lunchtime.  He tried an increase in Topamax 125 mg, did not see much change, noted more side effect with memory and drowsiness.  He presents today for evaluation unaccompanied.  REVIEW OF SYSTEMS: Out of a complete 14 system review of symptoms, the patient complains only of the  following symptoms, and all other reviewed systems are negative.  Headache  ALLERGIES: No Known Allergies  HOME MEDICATIONS: Outpatient Medications Prior to Visit  Medication Sig Dispense Refill  . allopurinol (ZYLOPRIM) 100 MG tablet Take 100 mg by mouth at bedtime.     Marland Kitchen aspirin EC 325 MG tablet Take 1 tablet (325 mg total) by mouth daily. 30 tablet 0  . colchicine 0.6 MG tablet Take 0.6 mg by mouth 2 (two) times daily as needed (gout).    Marland Kitchen FLUoxetine (PROZAC) 20 MG tablet Take 40 mg by mouth daily.     Marland Kitchen levothyroxine (SYNTHROID, LEVOTHROID) 112 MCG tablet Take 112 mcg by mouth See admin instructions. Takes 112 mcg every day, except Wednesday take 224 mcg.    . naproxen (NAPROSYN) 500 MG tablet Take 500 mg by mouth 2 (two) times daily as needed for mild pain.    Marland Kitchen oxyCODONE-acetaminophen (PERCOCET) 5-325 MG tablet Take 1 tablet by mouth every 6 (six) hours as needed. 42 tablet 0  . Baclofen 5 MG TABS Take 5 mg by mouth as needed. Take 1 tablet at onset of headache pain, may repeat dose in 24 hours if needed 30 tablet 1  . Galcanezumab-gnlm (EMGALITY) 120 MG/ML SOAJ Inject 120 mg into the skin every 30 (thirty) days. 1 pen 11  . rizatriptan (MAXALT) 10 MG tablet TAKE 1 TABLET BY MOUTH THREE TIMES DAILYAS NEEDED FOR MIGRAINE. MAY REPEAT IN 2 HOURS IF NEEDED. (Patient taking differently: Take 10 mg by mouth 3 (three) times daily as needed for  migraine. MAY REPEAT IN 2 HOURS IF NEEDED.) 10 tablet 5   No facility-administered medications prior to visit.    PAST MEDICAL HISTORY: Past Medical History:  Diagnosis Date  . Common migraine 09/28/2017  . Dyspnea    when walking with crutches  . Family history of adverse reaction to anesthesia    mother/father- nausea  . Gout   . Headache syndrome 09/28/2017   Right occipital  . History of kidney stones    passed  . Hypothyroid   . Migraine    Control Emgatitin    PAST SURGICAL HISTORY: Past Surgical History:  Procedure Laterality  Date  . FEMUR SURGERY Left    repair with repair  . FEMUR SURGERY Left    hardware removal  . LAPAROSCOPIC APPENDECTOMY N/A 12/18/2018   Procedure: APPENDECTOMY LAPAROSCOPIC;  Surgeon: Almond Lint, MD;  Location: [facility];  Service: General;  Laterality: N/A;  . 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 LEG SURGERY Left    Removed hardware  . ORIF ANKLE FRACTURE Left 11/05/2020   Procedure: OPEN REDUCTION INTERNAL FIXATION (ORIF) LEFT ANKLE FRACTURE;  Surgeon: Nadara Mustard, MD;  Location: [facility] OR;  Service: Orthopedics;  Laterality: Left;  . SHOULDER SURGERY Left    Frozen shoulder    FAMILY HISTORY: Family History  Problem Relation Age of Onset  . Congestive Heart Failure Mother   . Diabetes Mother   . Kidney failure Father   . Brain cancer Cousin     SOCIAL HISTORY: Social History   Socioeconomic History  . Marital status: Married    Spouse name: Not on file  . Number of children: 1  . Years of education: 78  . Highest education level: Not on file  Occupational History  . Occupation: [employer]  Tobacco Use  . Smoking status: Never Smoker  . Smokeless tobacco: Never Used  Vaping Use  . Vaping Use: Never used  Substance and Sexual Activity  . Alcohol use: No  . Drug use: No  . Sexual activity: Not Currently    Birth control/protection: None  Other Topics Concern  . Not on file  Social History Narrative   Lives with wife, daughter   Caffeine use: soft drinks daily      Right handed    Social Determinants of Health   Financial Resource Strain:   . Difficulty of Paying Living Expenses: Not on file  Food Insecurity:   . Worried About Programme researcher, broadcasting/film/video in the Last Year: Not on file  . Ran Out of Food in the Last Year: Not on file  Transportation Needs:   . Lack of Transportation (Medical): Not on file  . Lack of Transportation (Non-Medical): Not on file  Physical Activity:   . Days of Exercise per Week: Not on file  . Minutes of Exercise per  Session: Not on file  Stress:   . Feeling of Stress : Not on file  Social Connections:   . Frequency of Communication with Friends and Family: Not on file  . Frequency of Social Gatherings with Friends and Family: Not on file  . Attends Religious Services: Not on file  . Active Member of Clubs or Organizations: Not on file  . Attends Banker Meetings: Not on file  . Marital Status: Not on file  Intimate Partner Violence:   . Fear of Current or Ex-Partner: Not on file  . Emotionally Abused: Not on file  . Physically Abused: Not  on file  . Sexually Abused: Not on file   PHYSICAL EXAM  Vitals:   11/20/20 1040  BP: 126/82  Pulse: 84  Height: 6\' 1"  (1.854 m)   Body mass index is 31 kg/m.  Generalized: Well developed, in no acute distress   Neurological examination  Mentation: Alert oriented to time, place, history taking. Follows all commands speech and language fluent Cranial nerve II-XII: Pupils were equal round reactive to light. Extraocular movements were full, visual field were full on confrontational test. Facial sensation and strength were normal. Head turning and shoulder shrug  were normal and symmetric. Motor: Good strength all extremities, in left post op boot after ankle surgery.  Sensory: Sensory testing is intact to soft touch on all 4 extremities. No evidence of extinction is noted.  Coordination: Cerebellar testing reveals good finger-nose-finger bilaterally Gait and station: Gait is limping on the left, using crutches  DIAGNOSTIC DATA (LABS, IMAGING, TESTING) - I reviewed patient records, labs, notes, testing and imaging myself where available.  Lab Results  Component Value Date   WBC 6.5 11/05/2020   HGB 14.5 11/05/2020   HCT 44.5 11/05/2020   MCV 93.7 11/05/2020   PLT 214 11/05/2020      Component Value Date/Time   NA 139 11/05/2020 1204   K 4.7 11/05/2020 1204   CL 100 11/05/2020 1204   CO2 23 12/26/2018 1240   GLUCOSE 95 11/05/2020  1204   BUN 20 11/05/2020 1204   CREATININE 1.40 (H) 11/05/2020 1204   CALCIUM 9.7 12/26/2018 1240   PROT 8.1 12/26/2018 1240   ALBUMIN 4.7 12/26/2018 1240   AST 34 12/26/2018 1240   ALT 94 (H) 12/26/2018 1240   ALKPHOS 138 (H) 12/26/2018 1240   BILITOT 0.6 12/26/2018 1240   GFRNONAA >60 12/26/2018 1240   GFRAA >60 12/26/2018 1240   No results found for: CHOL, HDL, LDLCALC, LDLDIRECT, TRIG, CHOLHDL No results found for: 02/24/2019 No results found for: VITAMINB12 No results found for: TSH  ASSESSMENT AND PLAN 44 y.o. year old male  has a past medical history of Common migraine (09/28/2017), Dyspnea, Family history of adverse reaction to anesthesia, Gout, Headache syndrome (09/28/2017), History of kidney stones, Hypothyroid, and Migraine. here with:  1.  Right occipital headache, likely migraine headache component  Has had 90% improvement in headaches with the addition of Emgality.  He will remain on Emgality, he has been able to discontinue Topamax.  I will let him try Nurtec to have on hand for acute headache, since he responded so well to the CGRP mechanism of Emgality.  I will also refill baclofen and Maxalt for acute headache, if Nurtec is not beneficial.  He will follow-up in 6 months or sooner if needed.  I spent 30 minutes of face-to-face and non-face-to-face time with patient.  This included previsit chart review, lab review, study review, order entry, electronic health record documentation, patient education.  11/28/2017, AGNP-C, DNP 11/20/2020, 11:19 AM [facility] [address] [place], [place] [phone]

## 2020-11-19 NOTE — Progress Notes (Signed)
Office Visit Note   Patient: Thomas Burgess           Date of Birth: 06-May-1976           MRN: [id number] Visit Date: 11/19/2020              Requested by: Kaleen Mask, MD [address] [place],  [place] [address] PCP: Kaleen Mask, MD  Chief Complaint  Patient presents with   Left Ankle - Routine Post Op    11/05/20 ORIF left ankle fx       HPI: Patient is a pleasant 44 year old gentleman who is now 2 weeks status post open reduction internal fixation of left distal fibula fracture.  He has been nonweightbearing in his cam [place] boot.  He complains of some burning in his feet especially when he tries to wiggle his toes.  Assessment & Plan: Visit Diagnoses:  1. Pain in left ankle and joints of left foot   2. Closed fracture of left ankle with routine healing, subsequent encounter     Plan: Patient may begin partial weightbearing but should still be using his crutches.  He will be referred to outpatient physical therapy.  In 2 weeks he may begin weightbearing as tolerated in his boot I think for some of his swelling he would also benefit from a compression sock.  I have given him a prescription for a vive extra-large compression sock 15-20..  I have given him a work note that in 2 weeks he also may return to a seated position with ability to elevate his leg.  Walking and standing should be limited to 10 minutes/h.  He will follow-up in 4 weeks  Follow-Up Instructions: No follow-ups on file.   Ortho Exam  Patient is alert, oriented, no adenopathy, well-dressed, normal affect, normal respiratory effort. Left ankle well-healed surgical incision.  No necrosis no cellulitis no signs of infection.  Wound is healed and surgical sutures were harvested today.  He does have some hypersensitivity in his toes and his ankle.  He is able to do ankle range of motion and wiggle his toes.  Compartments are soft and nontender.  He has a negative Homans' sign.  He does have  moderate swelling in his foot and ankle  Imaging: No results found. No images are attached to the encounter.  Labs: No results found for: HGBA1C, ESRSEDRATE, CRP, LABURIC, REPTSTATUS, GRAMSTAIN, CULT, LABORGA   Lab Results  Component Value Date   ALBUMIN 4.7 12/26/2018   ALBUMIN 4.4 12/16/2018    No results found for: MG No results found for: VD25OH  No results found for: PREALBUMIN CBC EXTENDED Latest Ref Rng & Units 11/05/2020 11/05/2020 12/26/2018  WBC 4.0 - 10.5 K/uL 6.5 - 8.0  RBC 4.22 - 5.81 MIL/uL 4.75 - 4.71  HGB 13.0 - 17.0 g/dL 27.0 35.0 09.3  HCT 39 - 52 % 44.5 40.0 42.8  PLT 150 - 400 K/uL 214 - 262     There is no height or weight on file to calculate BMI.  Orders:  Orders Placed This Encounter  Procedures   XR Ankle Complete Left   Ambulatory referral to Physical Therapy   No orders of the defined types were placed in this encounter.    Procedures: No procedures performed  Clinical Data: No additional findings.  ROS:  All other systems negative, except as noted in the HPI. Review of Systems  Objective: Vital Signs: There were no vitals taken for this visit.  Specialty Comments:  No specialty comments available.  PMFS History: Patient Active Problem List   Diagnosis Date Noted   S/P ORIF (open reduction internal fixation) fracture 11/05/2020   Left ankle pain 11/05/2020   Closed fracture of left ankle    Periumbilical abdominal pain 12/17/2018   Headache syndrome 09/28/2017   Common migraine 09/28/2017   Primary localized osteoarthritis of left knee 09/19/2017   Past Medical History:  Diagnosis Date   Common migraine 09/28/2017   Dyspnea    when walking with crutches   Family history of adverse reaction to anesthesia    mother/father- nausea   Gout    Headache syndrome 09/28/2017   Right occipital   History of kidney stones    passed   Hypothyroid    Migraine    Control Emgatitin    Family History   Problem Relation Age of Onset   Congestive Heart Failure Mother    Diabetes Mother    Kidney failure Father    Brain cancer Cousin     Past Surgical History:  Procedure Laterality Date   FEMUR SURGERY Left    repair with repair   FEMUR SURGERY Left    hardware removal   LAPAROSCOPIC APPENDECTOMY N/A 12/18/2018   Procedure: APPENDECTOMY LAPAROSCOPIC;  Surgeon: Almond Lint, MD;  Location: [facility];  Service: General;  Laterality: N/A;   LEG SURGERY Bilateral 2006   Broke both legs   LEG SURGERY Left    Removed hardware   ORIF ANKLE FRACTURE Left 11/05/2020   Procedure: OPEN REDUCTION INTERNAL FIXATION (ORIF) LEFT ANKLE FRACTURE;  Surgeon: Nadara Mustard, MD;  Location: [facility] OR;  Service: Orthopedics;  Laterality: Left;   SHOULDER SURGERY Left    Frozen shoulder   Social History   Occupational History   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

## 2020-11-20 ENCOUNTER — Encounter: Payer: Self-pay | Admitting: Neurology

## 2020-11-20 ENCOUNTER — Other Ambulatory Visit: Payer: Self-pay

## 2020-11-20 ENCOUNTER — Ambulatory Visit (INDEPENDENT_AMBULATORY_CARE_PROVIDER_SITE_OTHER): Payer: BC Managed Care – PPO | Admitting: Neurology

## 2020-11-20 VITALS — BP 126/82 | HR 84 | Ht 73.0 in

## 2020-11-20 DIAGNOSIS — G43009 Migraine without aura, not intractable, without status migrainosus: Secondary | ICD-10-CM

## 2020-11-20 DIAGNOSIS — G4489 Other headache syndrome: Secondary | ICD-10-CM

## 2020-11-20 MED ORDER — EMGALITY 120 MG/ML ~~LOC~~ SOAJ: 120.0000 mg | SUBCUTANEOUS | 11 refills | Status: DC

## 2020-11-20 MED ORDER — BACLOFEN 5 MG PO TABS: 5.0000 mg | ORAL_TABLET | ORAL | 1 refills | Status: DC | PRN

## 2020-11-20 MED ORDER — NURTEC 75 MG PO TBDP: 75.0000 mg | ORAL_TABLET | ORAL | 5 refills | Status: DC | PRN

## 2020-11-20 MED ORDER — RIZATRIPTAN BENZOATE 10 MG PO TABS: ORAL_TABLET | ORAL | 5 refills | Status: DC

## 2020-11-20 NOTE — Progress Notes (Signed)
I have read the note, and I agree with the clinical assessment and plan.  Tyrie Porzio K Shaft Corigliano   

## 2020-11-20 NOTE — Patient Instructions (Signed)
Continue current medications Try Nurtec for acute headache when it happens (take 1 at onset of headache, max is 1 tablet in 24 hours) See you back in 6 months

## 2020-11-24 ENCOUNTER — Other Ambulatory Visit: Payer: Self-pay | Admitting: Physician Assistant

## 2020-11-24 ENCOUNTER — Ambulatory Visit: Payer: BC Managed Care – PPO | Admitting: Neurology

## 2020-11-24 MED ORDER — OXYCODONE-ACETAMINOPHEN 5-325 MG PO TABS: 1.0000 | ORAL_TABLET | Freq: Four times a day (QID) | ORAL | 0 refills | Status: DC | PRN

## 2020-12-01 ENCOUNTER — Ambulatory Visit (INDEPENDENT_AMBULATORY_CARE_PROVIDER_SITE_OTHER): Payer: Worker's Compensation | Admitting: Rehabilitative and Restorative Service Providers"

## 2020-12-01 ENCOUNTER — Other Ambulatory Visit: Payer: Self-pay

## 2020-12-01 ENCOUNTER — Encounter: Payer: Self-pay | Admitting: Rehabilitative and Restorative Service Providers"

## 2020-12-01 DIAGNOSIS — M25572 Pain in left ankle and joints of left foot: Secondary | ICD-10-CM | POA: Diagnosis not present

## 2020-12-01 DIAGNOSIS — M25672 Stiffness of left ankle, not elsewhere classified: Secondary | ICD-10-CM | POA: Diagnosis not present

## 2020-12-01 DIAGNOSIS — M6281 Muscle weakness (generalized): Secondary | ICD-10-CM

## 2020-12-01 DIAGNOSIS — R600 Localized edema: Secondary | ICD-10-CM

## 2020-12-01 DIAGNOSIS — R262 Difficulty in walking, not elsewhere classified: Secondary | ICD-10-CM | POA: Diagnosis not present

## 2020-12-01 NOTE — Patient Instructions (Signed)
Access Code: [email or web address] URL: [email or web address] Date: 12/01/2020 Prepared by: Chyrel Masson  Exercises Long Sitting Ankle Eversion with Resistance - 2 x daily - 7 x weekly - 3 sets - 10 reps Long Sitting Ankle Plantar Flexion with Resistance - 2 x daily - 7 x weekly - 3 sets - 10 reps Long Sitting Ankle Inversion with Resistance - 2 x daily - 7 x weekly - 3 sets - 10 reps Long Sitting Ankle Dorsiflexion with Anchored Resistance - 2 x daily - 7 x weekly - 3 sets - 10 reps Long Sitting Calf Stretch with Strap - 2 x daily - 7 x weekly - 1 sets - 5 reps - 30 hold Ankle Pumps in Elevation - 2 x daily - 7 x weekly - 3 sets - 10 reps Ankle Alphabet in Elevation - 2 x daily - 7 x weekly - 1 sets - 3 reps

## 2020-12-01 NOTE — Therapy (Signed)
[facility] [address] [place], [place], [address] Phone: [phone]   Fax:  [phone]  Physical Therapy Evaluation  Patient Details  Name: Thomas Burgess MRN: [id number] Date of Birth: 1976/11/11 Referring Provider (PT): West Bali Persons, PA-C   Encounter Date: 12/01/2020   PT End of Session - 12/01/20 0807    Visit Number 1    Number of Visits 12    Date for PT Re-Evaluation 01/02/21    Authorization Type Workers [employer] 12 visits by 01/02/2021    Authorization - Visit Number 1    Authorization - Number of Visits 12    Progress Note Due on Visit 6    PT Start Time 0805    PT Stop Time 0842    PT Time Calculation (min) 37 min    Activity Tolerance Patient tolerated treatment well    Behavior During Therapy [facility] for tasks assessed/performed           Past Medical History:  Diagnosis Date  . Common migraine 09/28/2017  . Dyspnea    when walking with crutches  . Family history of adverse reaction to anesthesia    mother/father- nausea  . Gout   . Headache syndrome 09/28/2017   Right occipital  . History of kidney stones    passed  . Hypothyroid   . Migraine    Control Emgatitin    Past Surgical History:  Procedure Laterality Date  . FEMUR SURGERY Left    repair with repair  . FEMUR SURGERY Left    hardware removal  . LAPAROSCOPIC APPENDECTOMY N/A 12/18/2018   Procedure: APPENDECTOMY LAPAROSCOPIC;  Surgeon: Almond Lint, MD;  Location: [facility];  Service: General;  Laterality: N/A;  . 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 LEG SURGERY Left    Removed hardware  . ORIF ANKLE FRACTURE Left 11/05/2020   Procedure: OPEN REDUCTION INTERNAL FIXATION (ORIF) LEFT ANKLE FRACTURE;  Surgeon: Nadara Mustard, MD;  Location: [facility] OR;  Service: Orthopedics;  Laterality: Left;  . SHOULDER SURGERY Left    Frozen shoulder    There were no vitals filed for this visit.    Subjective Assessment - 12/01/20 0811    Subjective Pt.  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  Pt. indicated wearing boot for ambulation and use of crutch.  Pt. stated pain on bottom of foot with walking.  Pt. stated limitation in range of motion of ankle/foot and toes.    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 Yes    Pain Score 2    10/10 at worst   Pain Location Ankle    Pain Orientation Left    Pain Descriptors / Indicators Aching;Sharp    Pain Type Surgical pain;Acute pain    Pain Onset 1 to 4 weeks ago    Pain Frequency Constant    Aggravating Factors  standing, walking, tightness in movement    Pain Relieving Factors rest    Effect of Pain on Daily Activities Limited in standing/walking, work.  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    Light  duty can be computer work.              [facility] - 12/01/20 0001      Assessment   Medical Diagnosis Closed fracture c ORIF Lt ankle    Referring Provider (PT) West BaliMary Anne Persons, PA-C    Onset Date/Surgical Date 11/05/20   injury 10/29/2020   Hand Dominance Right      Precautions   Precaution Comments WBAT in boot      Restrictions   Other Position/Activity Restrictions WBAT in boot as tolerated      Balance Screen   Has the patient fallen in the past 6 months No    Has the patient had a decrease in activity level because of a fear of falling?  Yes      Home Environment   Living Environment Private residence      Cognition   Overall Cognitive Status Within Functional Limits for tasks assessed      Observation/Other Assessments-Edema    Edema Figure 8   Rt 22  inch, Lt 23 inch     Sensation   Additional Comments Tenderess Lt lateral ankle near incision, distal fibula      Functional Tests   Functional tests Sit to Stand;Single leg stance;Squat;Step up;Step down      Squat   Comments unable, Frame boot      Step Up   Comments unable, Lomba boot      Step Down   Comments unable, [place] boot      Single Leg Stance   Comments unable on Lt LE      Sit to Stand   Comments hand required, Coreas boot on      ROM / Strength   AROM / PROM / Strength PROM;Strength;AROM      AROM   Overall AROM Comments mild end range symptoms all direction Lt ankle    AROM Assessment Site Ankle    Right/Left Ankle Left;Right    Right Ankle Dorsiflexion 10    Right Ankle Plantar Flexion 55    Right Ankle Inversion 30    Right Ankle Eversion 10    Left Ankle Dorsiflexion -30    Left Ankle Plantar Flexion 40    Left Ankle Inversion 5    Left Ankle Eversion 0      PROM   Overall PROM Comments mild symptoms Lt ankle end ranges, reported in ankle anterior/lateral    PROM Assessment Site Ankle    Right/Left Ankle Left;Right    Left Ankle Dorsiflexion -12    Left Ankle Plantar Flexion 45    Left Ankle Inversion 10    Left Ankle Eversion 5      Strength   Strength Assessment Site Ankle;Knee;Hip    Right/Left Hip Left;Right    Right/Left Knee Left;Right    Right/Left Ankle Left;Right    Right Ankle Dorsiflexion 5/5    Right Ankle Plantar Flexion 5/5    Right Ankle Inversion 5/5    Right Ankle Eversion 5/5    Left Ankle Dorsiflexion 2/5    Left Ankle Plantar Flexion 2/5    Left Ankle Inversion 2/5    Left Ankle Eversion 2/5      Palpation   Palpation comment Tender distal fibula      Ambulation/Gait   Gait Comments Rt axillary crutch, Folden boot Lt LE c step through gait pattern, Rt trunk lean to axillary crutch  Objective measurements completed on examination: See above findings.       [facility] Adult PT  Treatment/Exercise - 12/01/20 0001      Exercises   Exercises Other Exercises;Ankle    Other Exercises  HEP instruction/performance per handout.  Consisted of 1 trial set of each in clinic : ankle pumps, abcs ROM, PF, DF, inversion, eversion AROM (no band at this time), gastroc stretch c strap 30 sec      Modalities   Modalities Vasopneumatic      Vasopneumatic   Number Minutes Vasopneumatic  10 minutes    Vasopnuematic Location  Ankle   Lt   Vasopneumatic Pressure Low    Vasopneumatic Temperature  34                  PT Education - 12/01/20 0853    Education Details HEP, POC    Person(s) Educated Patient    Methods Explanation;Demonstration;Verbal cues;Handout    Comprehension Returned demonstration;Verbalized understanding            PT Short Term Goals - 12/01/20 4166      PT SHORT TERM GOAL #1   Title Patient will demonstrate independent use of home exercise program to maintain progress from in clinic treatments.    Time 3    Period Weeks    Status New    Target Date 12/22/20      PT SHORT TERM GOAL #2   Title Pt. will demonstrate ambulation c boot s crutches community distances.    Time 3    Period Weeks    Status New    Target Date 12/22/20             PT Long Term Goals - 12/01/20 0843      PT LONG TERM GOAL #1   Title Patient will demonstrate/report pain at worst less than or equal to 2/10 to facilitate minimal limitation in daily activity secondary to pain symptoms.    Time 8    Period Weeks    Status New    Target Date 01/26/21      PT LONG TERM GOAL #2   Title Patient will demonstrate independent use of home exercise program to facilitate ability to maintain/progress functional gains from skilled physical therapy services.    Time 8    Period Weeks    Status New    Target Date 01/26/21      PT LONG TERM GOAL #3   Title Patient will demonstrate independent ambulation community distances > 500 ft to facilitate community integration at  [facility].    Time 8    Period Weeks    Target Date 01/26/21      PT LONG TERM GOAL #4   Title Pt. will demonstrate Lt ankle AROM within 85% of Lt to facilitate ability to ambulation, navigate stairs at PLOF.    Time 8    Period Weeks    Status New    Target Date 01/26/21      PT LONG TERM GOAL #5   Title Pt. will demonstrate ability to lift/carry 100 lbs, ascending descend 8-10 inch stairs for work related activity at PLOF.    Time 8    Period Weeks    Status New    Target Date 01/26/21      Additional Long Term Goals   Additional Long Term Goals Yes      PT LONG TERM GOAL #6   Title Pt. will demonstrate return to work at [employer].  Time 8    Period Weeks    Status New    Target Date 01/26/21                  Plan - 12/01/20 0837    Clinical Impression Statement Patient is a 44 y.o. male who comes to clinic with complaints of Lt ankle pain with mobility, strength and movement coordination deficits that impair their ability to perform usual daily and recreational functional activities without increase difficulty/symptoms at this time.  Patient to benefit from skilled PT services to address impairments and limitations to improve to previous level of function without restriction secondary to condition.    Examination-Activity Limitations Sit;Squat;Bend;Stairs;Stand;Transfers;Lift;Locomotion Level    Examination-Participation Restrictions Community Activity;Occupation;Shop;Driving;Yard Work    Stability/Clinical Decision Making Stable/Uncomplicated    Clinical Decision Making Low    Rehab Potential Good    PT Frequency --   2-3x/week   PT Duration 8 weeks    PT Treatment/Interventions ADLs/Self Care Home Management;Cryotherapy;Electrical Stimulation;Iontophoresis 4mg /ml Dexamethasone;Moist Heat;Balance training;Therapeutic exercise;Manual techniques;Therapeutic activities;Functional mobility training;Stair training;Gait training;Ultrasound;Neuromuscular  re-education;Patient/family education;DME Instruction;Passive range of motion;Dry needling;Spinal Manipulations;Joint Manipulations;Taping;Vasopneumatic Device    PT Next Visit Plan Ankle mobility gains, NWB active movement/strengthening, nustep ok    PT Home Exercise Plan [email or web address]    Consulted and Agree with Plan of Care Patient           Patient will benefit from skilled therapeutic intervention in order to improve the following deficits and impairments:  Abnormal gait,Decreased endurance,Hypomobility,Increased edema,Decreased activity tolerance,Decreased strength,Pain,Difficulty walking,Decreased mobility,Decreased balance,Improper body mechanics,Impaired perceived functional ability,Impaired flexibility,Decreased coordination,Decreased range of motion  Visit Diagnosis: Pain in left ankle and joints of left foot  Stiffness of left ankle, not elsewhere classified  Muscle weakness (generalized)  Difficulty in walking, not elsewhere classified  Localized edema     Problem List Patient Active Problem List   Diagnosis Date Noted  . S/P ORIF (open reduction internal fixation) fracture 11/05/2020  . Left ankle pain 11/05/2020  . Closed fracture of left ankle   . Periumbilical abdominal pain 12/17/2018  . Headache syndrome 09/28/2017  . Common migraine 09/28/2017  . Primary localized osteoarthritis of left knee 09/19/2017    11/19/2017, PT, DPT, OCS, ATC 12/01/20  9:03 AM    [facility] [address] [place], [place], [place] Phone: [phone]   Fax:  [phone]  Name: Thomas Burgess MRN: Katha Hamming Date of Birth: [birth year]

## 2020-12-03 ENCOUNTER — Other Ambulatory Visit: Payer: Self-pay | Admitting: Physician Assistant

## 2020-12-04 ENCOUNTER — Other Ambulatory Visit: Payer: Self-pay

## 2020-12-04 ENCOUNTER — Encounter: Payer: Self-pay | Admitting: Rehabilitative and Restorative Service Providers"

## 2020-12-04 ENCOUNTER — Ambulatory Visit (INDEPENDENT_AMBULATORY_CARE_PROVIDER_SITE_OTHER): Payer: Worker's Compensation | Admitting: Rehabilitative and Restorative Service Providers"

## 2020-12-04 DIAGNOSIS — M25672 Stiffness of left ankle, not elsewhere classified: Secondary | ICD-10-CM

## 2020-12-04 DIAGNOSIS — M25572 Pain in left ankle and joints of left foot: Secondary | ICD-10-CM | POA: Diagnosis not present

## 2020-12-04 DIAGNOSIS — M6281 Muscle weakness (generalized): Secondary | ICD-10-CM | POA: Diagnosis not present

## 2020-12-04 DIAGNOSIS — R600 Localized edema: Secondary | ICD-10-CM

## 2020-12-04 DIAGNOSIS — R262 Difficulty in walking, not elsewhere classified: Secondary | ICD-10-CM | POA: Diagnosis not present

## 2020-12-04 NOTE — Therapy (Signed)
[facility] [address] [place], [place], [address] Phone: [phone]   Fax:  [phone]  Physical Therapy Treatment  Patient Details  Name: Thomas Burgess MRN: [id number] Date of Birth: [birth year] Referring Provider (PT): West Bali Persons, PA-C   Encounter Date: 12/04/2020   PT End of Session - 12/04/20 1356    Visit Number 2    Number of Visits 12    Date for PT Re-Evaluation 01/02/21    Authorization Type Workers Comp 12 visits by 01/02/2021    Authorization - Visit Number 2    Authorization - Number of Visits 12    Progress Note Due on Visit 6    PT Start Time 1346    PT Stop Time 1435    PT Time Calculation (min) 49 min    Activity Tolerance Patient tolerated treatment well    Behavior During Therapy [facility] for tasks assessed/performed           Past Medical History:  Diagnosis Date  . Common migraine 09/28/2017  . Dyspnea    when walking with crutches  . Family history of adverse reaction to anesthesia    mother/father- nausea  . Gout   . Headache syndrome 09/28/2017   Right occipital  . History of kidney stones    passed  . Hypothyroid   . Migraine    Control Emgatitin    Past Surgical History:  Procedure Laterality Date  . FEMUR SURGERY Left    repair with repair  . FEMUR SURGERY Left    hardware removal  . LAPAROSCOPIC APPENDECTOMY N/A 12/18/2018   Procedure: APPENDECTOMY LAPAROSCOPIC;  Surgeon: Almond Lint, MD;  Location: [facility];  Service: General;  Laterality: N/A;  . 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 LEG SURGERY Left    Removed hardware  . ORIF ANKLE FRACTURE Left 11/05/2020   Procedure: OPEN REDUCTION INTERNAL FIXATION (ORIF) LEFT ANKLE FRACTURE;  Surgeon: Nadara Mustard, MD;  Location: [facility] OR;  Service: Orthopedics;  Laterality: Left;  . SHOULDER SURGERY Left    Frozen shoulder    There were no vitals filed for this visit.   Subjective Assessment - 12/04/20 1348    Subjective Pt.  indicated side to side movement in tough and feels some pain anterior ankle, maybe due to boot and also on bottom of foot.    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 No/denies    Pain Score --   none at rest   Pain Onset 1 to 4 weeks ago                             [facility] Adult PT Treatment/Exercise - 12/04/20 0001      Vasopneumatic   Number Minutes Vasopneumatic  10 minutes    Vasopnuematic Location  Ankle    Vasopneumatic Pressure Medium    Vasopneumatic Temperature  34      Manual Therapy   Manual therapy comments g3-g4 ap talocrural jt mobs Lt ankle, medial/lateral subtalara g3 mobs, PROM DF      Ankle Exercises: Stretches   Gastroc Stretch 30 seconds;5 reps   long sitting c strap     Ankle Exercises: Seated   BAPS Level 2;15 reps;Sitting  fwd/back, circles cw, ccw   Other Seated Ankle Exercises long sitting tband pf, inversion, eversion, DF green band 3 x 10 each      Ankle Exercises: Aerobic   Nustep Lvl 5 10 mins                    PT Short Term Goals - 12/01/20 6387      PT SHORT TERM GOAL #1   Title Patient will demonstrate independent use of home exercise program to maintain progress from in clinic treatments.    Time 3    Period Weeks    Status New    Target Date 12/22/20      PT SHORT TERM GOAL #2   Title Pt. will demonstrate ambulation c boot s crutches community distances.    Time 3    Period Weeks    Status New    Target Date 12/22/20             PT Long Term Goals - 12/01/20 0843      PT LONG TERM GOAL #1   Title Patient will demonstrate/report pain at worst less than or equal to 2/10 to facilitate minimal limitation in daily activity secondary to pain symptoms.    Time 8    Period Weeks    Status New     Target Date 01/26/21      PT LONG TERM GOAL #2   Title Patient will demonstrate independent use of home exercise program to facilitate ability to maintain/progress functional gains from skilled physical therapy services.    Time 8    Period Weeks    Status New    Target Date 01/26/21      PT LONG TERM GOAL #3   Title Patient will demonstrate independent ambulation community distances > 500 ft to facilitate community integration at [facility].    Time 8    Period Weeks    Target Date 01/26/21      PT LONG TERM GOAL #4   Title Pt. will demonstrate Lt ankle AROM within 85% of Lt to facilitate ability to ambulation, navigate stairs at PLOF.    Time 8    Period Weeks    Status New    Target Date 01/26/21      PT LONG TERM GOAL #5   Title Pt. will demonstrate ability to lift/carry 100 lbs, ascending descend 8-10 inch stairs for work related activity at PLOF.    Time 8    Period Weeks    Status New    Target Date 01/26/21      Additional Long Term Goals   Additional Long Term Goals Yes      PT LONG TERM GOAL #6   Title Pt. will demonstrate return to work at [employer].    Time 8    Period Weeks    Status New    Target Date 01/26/21                 Plan - 12/04/20 1418    Clinical Impression Statement Pt. demonstrated improvement in active mobility in all directions.  Still reported mild end range discomfort overall despite improvements.  Continued primarily NWB mobility/strength at this time secondardy to MD office instruction for WB in boot.  Progressed to no axillary crutches in clinc today c boot.    Examination-Activity Limitations Sit;Squat;Bend;Stairs;Stand;Transfers;Lift;Locomotion Level    Examination-Participation Restrictions Community Activity;Occupation;Shop;Driving;Yard Work    Stability/Clinical Decision Making Stable/Uncomplicated    Rehab Potential Good  PT Frequency --   2-3x/week   PT Duration 8 weeks    PT Treatment/Interventions ADLs/Self Care Home  Management;Cryotherapy;Electrical Stimulation;Iontophoresis 4mg /ml Dexamethasone;Moist Heat;Balance training;Therapeutic exercise;Manual techniques;Therapeutic activities;Functional mobility training;Stair training;Gait training;Ultrasound;Neuromuscular re-education;Patient/family education;DME Instruction;Passive range of motion;Dry needling;Spinal Manipulations;Joint Manipulations;Taping;Vasopneumatic Device    PT Next Visit Plan Ankle mobility gains, NWB active movement/strengthening, nustep ok    PT Home Exercise Plan [email or web address]    Consulted and Agree with Plan of Care Patient           Patient will benefit from skilled therapeutic intervention in order to improve the following deficits and impairments:  Abnormal gait,Decreased endurance,Hypomobility,Increased edema,Decreased activity tolerance,Decreased strength,Pain,Difficulty walking,Decreased mobility,Decreased balance,Improper body mechanics,Impaired perceived functional ability,Impaired flexibility,Decreased coordination,Decreased range of motion  Visit Diagnosis: Pain in left ankle and joints of left foot  Stiffness of left ankle, not elsewhere classified  Muscle weakness (generalized)  Difficulty in walking, not elsewhere classified  Localized edema     Problem List Patient Active Problem List   Diagnosis Date Noted  . S/P ORIF (open reduction internal fixation) fracture 11/05/2020  . Left ankle pain 11/05/2020  . Closed fracture of left ankle   . Periumbilical abdominal pain 12/17/2018  . Headache syndrome 09/28/2017  . Common migraine 09/28/2017  . Primary localized osteoarthritis of left knee 09/19/2017    11/19/2017, PT, DPT, OCS, ATC 12/04/20  2:20 PM    [facility] [address] [place], [place], [place] Phone: [phone]   Fax:  [phone]  Name: Thomas Burgess MRN: Katha Hamming Date of Birth: [birth year]

## 2020-12-08 ENCOUNTER — Encounter: Payer: Self-pay | Admitting: Neurology

## 2020-12-08 ENCOUNTER — Encounter: Payer: BC Managed Care – PPO | Admitting: Physical Therapy

## 2020-12-10 ENCOUNTER — Encounter: Payer: Self-pay | Admitting: Physical Therapy

## 2020-12-10 ENCOUNTER — Ambulatory Visit (INDEPENDENT_AMBULATORY_CARE_PROVIDER_SITE_OTHER): Payer: BC Managed Care – PPO | Admitting: Physical Therapy

## 2020-12-10 ENCOUNTER — Telehealth: Payer: Self-pay | Admitting: Neurology

## 2020-12-10 ENCOUNTER — Other Ambulatory Visit: Payer: Self-pay

## 2020-12-10 DIAGNOSIS — M25672 Stiffness of left ankle, not elsewhere classified: Secondary | ICD-10-CM | POA: Diagnosis not present

## 2020-12-10 DIAGNOSIS — R262 Difficulty in walking, not elsewhere classified: Secondary | ICD-10-CM

## 2020-12-10 DIAGNOSIS — M25572 Pain in left ankle and joints of left foot: Secondary | ICD-10-CM | POA: Diagnosis not present

## 2020-12-10 DIAGNOSIS — M6281 Muscle weakness (generalized): Secondary | ICD-10-CM | POA: Diagnosis not present

## 2020-12-10 DIAGNOSIS — R600 Localized edema: Secondary | ICD-10-CM

## 2020-12-10 NOTE — Telephone Encounter (Signed)
Pt called, prescription for Rimegepant Sulfate (NURTEC) 75 MG TBDP is not at the pharmacy. Pharmacy said nurse needs to call the pharmacy. Would like a call from the nurse.

## 2020-12-10 NOTE — Telephone Encounter (Signed)
Initiated CMM KEY [id number] for nurtec,  G43.009, [place] [id number], tried sumatriptan, rizatriptan, emgality, baclofen, topamax, flueoxetine, naproxen, meloxicam.  Determination up to 5 days pending.

## 2020-12-10 NOTE — Therapy (Signed)
[facility] Physical Therapy [address] [place], [place], [address] Phone: [phone]   Fax:  [phone]  Physical Therapy Treatment  Patient Details  Name: Thomas Burgess MRN: [id number] Date of Birth: 1976-09-04 Referring Provider (PT): West Bali Persons, PA-C   Encounter Date: 12/10/2020   PT End of Session - 12/10/20 0907    Visit Number 3    Number of Visits 12    Date for PT Re-Evaluation 01/02/21    Authorization Type Workers Comp 12 visits by 01/02/2021    Authorization - Visit Number 3    Authorization - Number of Visits 12    Progress Note Due on Visit 6    PT Start Time 0843    PT Stop Time 0927    PT Time Calculation (min) 44 min    Activity Tolerance Patient tolerated treatment well    Behavior During Therapy [facility] for tasks assessed/performed           Past Medical History:  Diagnosis Date  . Common migraine 09/28/2017  . Dyspnea    when walking with crutches  . Family history of adverse reaction to anesthesia    mother/father- nausea  . Gout   . Headache syndrome 09/28/2017   Right occipital  . History of kidney stones    passed  . Hypothyroid   . Migraine    Control Emgatitin    Past Surgical History:  Procedure Laterality Date  . FEMUR SURGERY Left    repair with repair  . FEMUR SURGERY Left    hardware removal  . LAPAROSCOPIC APPENDECTOMY N/A 12/18/2018   Procedure: APPENDECTOMY LAPAROSCOPIC;  Surgeon: Almond Lint, MD;  Location: [facility] ORS;  Service: General;  Laterality: N/A;  . 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 LEG SURGERY Left    Removed hardware  . ORIF ANKLE FRACTURE Left 11/05/2020   Procedure: OPEN REDUCTION INTERNAL FIXATION (ORIF) LEFT ANKLE FRACTURE;  Surgeon: Nadara Mustard, MD;  Location: [facility] OR;  Service: Orthopedics;  Laterality: Left;  . SHOULDER SURGERY Left    Frozen shoulder    There were no vitals filed for this visit.   Subjective Assessment - 12/10/20 0855    Subjective Pt  arriving to therapy reporting 8/10 stabbing/sharp pain at times. Current pain is 3/10 more on bottom of foot and inside of ankle.    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 Yes    Pain Score 3     Pain Location Ankle    Pain Orientation Left    Pain Descriptors / Indicators Aching;Tightness;Sharp    Pain Type Surgical pain    Pain Onset 1 to 4 weeks ago                             [facility] Adult PT Treatment/Exercise - 12/10/20 0001      Ambulation/Gait   Gait Comments amb with CAM [place] no assistive device      Exercises   Exercises Ankle      Modalities   Modalities Vasopneumatic      Vasopneumatic   Number Minutes Vasopneumatic  10 minutes    Vasopnuematic Location  Ankle    Vasopneumatic Pressure Medium    Vasopneumatic Temperature  34  Manual Therapy   Manual therapy comments g3-g4 ap talocrural jt mobs Lt ankle, medial/lateral subtalara g3 mobs, PROM DF      Ankle Exercises: Stretches   Gastroc Stretch 30 seconds;5 reps   long sitting c strap     Ankle Exercises: Aerobic   Nustep Lvl 5 10 mins      Ankle Exercises: Seated   ABC's 1 rep    BAPS Level 2;15 reps;Sitting   fwd/back, circles cw, ccw   Other Seated Ankle Exercises long sitting tband pf, inversion, eversion, DF green band 3 x 10 each                    PT Short Term Goals - 12/10/20 4315      PT SHORT TERM GOAL #1   Title Patient will demonstrate independent use of home exercise program to maintain progress from in clinic treatments.    Time 3    Period Weeks    Status On-going    Target Date 12/22/20      PT SHORT TERM GOAL #2   Title Pt. will demonstrate ambulation c boot s crutches community distances.    Status On-going             PT Long Term Goals -  12/01/20 0843      PT LONG TERM GOAL #1   Title Patient will demonstrate/report pain at worst less than or equal to 2/10 to facilitate minimal limitation in daily activity secondary to pain symptoms.    Time 8    Period Weeks    Status New    Target Date 01/26/21      PT LONG TERM GOAL #2   Title Patient will demonstrate independent use of home exercise program to facilitate ability to maintain/progress functional gains from skilled physical therapy services.    Time 8    Period Weeks    Status New    Target Date 01/26/21      PT LONG TERM GOAL #3   Title Patient will demonstrate independent ambulation community distances > 500 ft to facilitate community integration at [facility].    Time 8    Period Weeks    Target Date 01/26/21      PT LONG TERM GOAL #4   Title Pt. will demonstrate Lt ankle AROM within 85% of Lt to facilitate ability to ambulation, navigate stairs at PLOF.    Time 8    Period Weeks    Status New    Target Date 01/26/21      PT LONG TERM GOAL #5   Title Pt. will demonstrate ability to lift/carry 100 lbs, ascending descend 8-10 inch stairs for work related activity at PLOF.    Time 8    Period Weeks    Status New    Target Date 01/26/21      Additional Long Term Goals   Additional Long Term Goals Yes      PT LONG TERM GOAL #6   Title Pt. will demonstrate return to work at [employer].    Time 8    Period Weeks    Status New    Target Date 01/26/21                 Plan - 12/10/20 0913    Clinical Impression Statement Pt arriving to therpay reporting more pain on plantar surface of left foot. Pt also with mild medial pain. Pt tolerating exercises well with more difficulty with eversion. Continue  skilled PT.    Examination-Activity Limitations Sit;Squat;Bend;Stairs;Stand;Transfers;Lift;Locomotion Level    Examination-Participation Restrictions Community Activity;Occupation;Shop;Driving;Yard Work    Stability/Clinical Decision Making Stable/Uncomplicated     Rehab Potential Good    PT Duration 8 weeks    PT Treatment/Interventions ADLs/Self Care Home Management;Cryotherapy;Electrical Stimulation;Iontophoresis 4mg /ml Dexamethasone;Moist Heat;Balance training;Therapeutic exercise;Manual techniques;Therapeutic activities;Functional mobility training;Stair training;Gait training;Ultrasound;Neuromuscular re-education;Patient/family education;DME Instruction;Passive range of motion;Dry needling;Spinal Manipulations;Joint Manipulations;Taping;Vasopneumatic Device    PT Next Visit Plan Ankle mobility gains, NWB active movement/strengthening, nustep ok    PT Home Exercise Plan [email or web address]    Consulted and Agree with Plan of Care Patient           Patient will benefit from skilled therapeutic intervention in order to improve the following deficits and impairments:  Abnormal gait,Decreased endurance,Hypomobility,Increased edema,Decreased activity tolerance,Decreased strength,Pain,Difficulty walking,Decreased mobility,Decreased balance,Improper body mechanics,Impaired perceived functional ability,Impaired flexibility,Decreased coordination,Decreased range of motion  Visit Diagnosis: Pain in left ankle and joints of left foot  Stiffness of left ankle, not elsewhere classified  Muscle weakness (generalized)  Difficulty in walking, not elsewhere classified  Localized edema     Problem List Patient Active Problem List   Diagnosis Date Noted  . S/P ORIF (open reduction internal fixation) fracture 11/05/2020  . Left ankle pain 11/05/2020  . Closed fracture of left ankle   . Periumbilical abdominal pain 12/17/2018  . Headache syndrome 09/28/2017  . Common migraine 09/28/2017  . Primary localized osteoarthritis of left knee 09/19/2017    11/19/2017 PT, MPT 12/10/2020, 9:30 AM  [facility] Physical Therapy [address] [place], [place], [place] Phone: [phone]   Fax:  [phone]  Name: BERTEL VENARD MRN:  Katha Hamming Date of Birth: 17-Jul-1976

## 2020-12-15 ENCOUNTER — Telehealth: Payer: Self-pay

## 2020-12-15 DIAGNOSIS — G43009 Migraine without aura, not intractable, without status migrainosus: Secondary | ICD-10-CM

## 2020-12-15 NOTE — Telephone Encounter (Signed)
I have submitted PA request for Nurtec 75mg  on CMM, Key: [id number].  Awaiting determination.

## 2020-12-15 NOTE — Telephone Encounter (Signed)
I called pt and LMVM that did received denial as per listed below.  We can try discontinuiing the maxalt and use nurtec by itself.  Try samples (have 2 tablet package to see if helpful). Will wait for  Call back.

## 2020-12-15 NOTE — Telephone Encounter (Signed)
Received determination that NURTEC not approved for acute treatment of migraines with any other acute migrain drugs that interact with serotonin %HT.  These drugs not allowed in combination with the requested medication include triptans. Migranal, ubrelvy and nurtec. May request peer to peer discussion up to 10 days after determination.  [phone].

## 2020-12-16 MED ORDER — NURTEC 75 MG PO TBDP: ORAL_TABLET | ORAL | 0 refills | Status: DC

## 2020-12-16 NOTE — Addendum Note (Signed)
Addended by: Guy Begin on: 12/16/2020 12:08 PM   Modules accepted: Orders

## 2020-12-16 NOTE — Telephone Encounter (Addendum)
Spoke to pt and he will come by and get samples.  (4 tabs) take one daily onset of migraine (max only 1 tablet in 24 hours). Placed up front to pick up.  Aware not here Friday , closed after 1200 on this Thursday.    He verbalized understanding.  I explained that received denial due to pt on both maxalt and nurtec. We can cancel maxalt and see if will approve nurtec. (although pt aware to take only one or the other).  Another PA is pending for this drug NURTEC done 12-15-20 by N. Chauncey Reading.

## 2020-12-17 ENCOUNTER — Ambulatory Visit (INDEPENDENT_AMBULATORY_CARE_PROVIDER_SITE_OTHER): Payer: Worker's Compensation | Admitting: Physical Therapy

## 2020-12-17 ENCOUNTER — Other Ambulatory Visit: Payer: Self-pay

## 2020-12-17 ENCOUNTER — Ambulatory Visit (INDEPENDENT_AMBULATORY_CARE_PROVIDER_SITE_OTHER): Payer: Worker's Compensation | Admitting: Physician Assistant

## 2020-12-17 ENCOUNTER — Ambulatory Visit (INDEPENDENT_AMBULATORY_CARE_PROVIDER_SITE_OTHER): Payer: Worker's Compensation

## 2020-12-17 ENCOUNTER — Other Ambulatory Visit: Payer: Self-pay | Admitting: Physician Assistant

## 2020-12-17 ENCOUNTER — Encounter: Payer: Self-pay | Admitting: Physician Assistant

## 2020-12-17 VITALS — Ht 73.0 in | Wt 235.0 lb

## 2020-12-17 DIAGNOSIS — M6281 Muscle weakness (generalized): Secondary | ICD-10-CM | POA: Diagnosis not present

## 2020-12-17 DIAGNOSIS — R262 Difficulty in walking, not elsewhere classified: Secondary | ICD-10-CM | POA: Diagnosis not present

## 2020-12-17 DIAGNOSIS — M25572 Pain in left ankle and joints of left foot: Secondary | ICD-10-CM | POA: Diagnosis not present

## 2020-12-17 DIAGNOSIS — M25672 Stiffness of left ankle, not elsewhere classified: Secondary | ICD-10-CM | POA: Diagnosis not present

## 2020-12-17 DIAGNOSIS — R600 Localized edema: Secondary | ICD-10-CM

## 2020-12-17 MED ORDER — OXYCODONE HCL 5 MG PO CAPS: 5.0000 mg | ORAL_CAPSULE | Freq: Three times a day (TID) | ORAL | 0 refills | Status: DC | PRN

## 2020-12-17 NOTE — Therapy (Signed)
[facility] Physical Therapy [address] [place], [place], [address] Phone: [phone]   Fax:  [phone]  Physical Therapy Treatment/MD progress note  Progress Note reporting period date 12/01/20 to  12/17/20  See below for objective and subjective measurements relating to patients progress with PT.   Patient Details  Name: Thomas Burgess MRN: [id number] Date of Birth: 04-Oct-1976 Referring Provider (PT): West Bali Persons, PA-C   Encounter Date: 12/17/2020   PT End of Session - 12/17/20 1340    Visit Number 4    Number of Visits 12    Date for PT Re-Evaluation 01/02/21    Authorization Type Workers Comp 12 visits by 01/02/2021    Authorization - Visit Number 4    Authorization - Number of Visits 12    Progress Note Due on Visit 12    PT Start Time 1300    PT Stop Time 1355    PT Time Calculation (min) 55 min    Activity Tolerance Patient tolerated treatment well    Behavior During Therapy [facility] for tasks assessed/performed           Past Medical History:  Diagnosis Date  . Common migraine 09/28/2017  . Dyspnea    when walking with crutches  . Family history of adverse reaction to anesthesia    mother/father- nausea  . Gout   . Headache syndrome 09/28/2017   Right occipital  . History of kidney stones    passed  . Hypothyroid   . Migraine    Control Emgatitin    Past Surgical History:  Procedure Laterality Date  . FEMUR SURGERY Left    repair with repair  . FEMUR SURGERY Left    hardware removal  . LAPAROSCOPIC APPENDECTOMY N/A 12/18/2018   Procedure: APPENDECTOMY LAPAROSCOPIC;  Surgeon: Almond Lint, MD;  Location: [facility] ORS;  Service: General;  Laterality: N/A;  . 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 LEG SURGERY Left    Removed hardware  . ORIF ANKLE FRACTURE Left 11/05/2020   Procedure: OPEN REDUCTION INTERNAL FIXATION (ORIF) LEFT ANKLE FRACTURE;  Surgeon: Nadara Mustard, MD;  Location: [facility] OR;  Service: Orthopedics;   Laterality: Left;  . SHOULDER SURGERY Left    Frozen shoulder    There were no vitals filed for this visit.   Subjective Assessment - 12/17/20 1311    Subjective relays overall the pain is a 2 out of 10 today in his left ankle. But he does still have some intermittent pain in the plantar part of his foot and medial foot and feels like his foot/ankle needs to pop.    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, get back to work, R/C car driving              [facility] PT Assessment - 12/17/20 0001      Assessment   Medical Diagnosis Closed fracture c ORIF Lt ankle    Referring Provider (PT) West Bali Persons, PA-C    Onset Date/Surgical Date 11/05/20    Hand Dominance Right      Precautions   Precaution Comments WBAT in boot      AROM   Left Ankle Dorsiflexion 3    Left Ankle Plantar Flexion 40    Left Ankle Inversion 10    Left Ankle Eversion 7      Strength  Left Ankle Dorsiflexion 3+/5    Left Ankle Plantar Flexion 3+/5   tested in supine   Left Ankle Inversion 3+/5    Left Ankle Eversion 3+/5      Ambulation/Gait   Gait Comments amb with CAM [place] no assistive device, can walk short distance without CAM boot now                         [facility] Adult PT Treatment/Exercise - 12/17/20 0001      Vasopneumatic   Number Minutes Vasopneumatic  10 minutes    Vasopnuematic Location  Ankle    Vasopneumatic Pressure Medium    Vasopneumatic Temperature  34      Manual Therapy   Manual therapy comments g3-g4 ap talocrural jt mobs Lt ankle, medial/lateral subtalara g3 mobs, PROM DF, distraction mobs      Ankle Exercises: Stretches   Soleus Stretch 10 seconds   10 reps at [employer] Stretch 30 seconds;3 reps   standing     Ankle Exercises: Aerobic   Nustep Lvl 5 10 mins      Ankle Exercises: Seated    Heel Raises 15 reps;Right   with self resistance at knee   Toe Raise 15 reps    BAPS Level 2;Sitting   20 reps all planes and circles     Ankle Exercises: Supine   T-Band green band X 20 each, 4way                    PT Short Term Goals - 12/17/20 1344      PT SHORT TERM GOAL #1   Title Patient will demonstrate independent use of home exercise program to maintain progress from in clinic treatments.    Time 3    Period Weeks    Status Achieved    Target Date 12/22/20      PT SHORT TERM GOAL #2   Title Pt. will demonstrate ambulation c boot s crutches community distances.    Status Achieved             PT Long Term Goals - 12/17/20 1346      PT LONG TERM GOAL #1   Title Patient will demonstrate/report pain at worst less than or equal to 2/10 to facilitate minimal limitation in daily activity secondary to pain symptoms.    Time 8    Period Weeks    Status On-going      PT LONG TERM GOAL #2   Title Patient will demonstrate independent use of home exercise program to facilitate ability to maintain/progress functional gains from skilled physical therapy services.    Time 8    Period Weeks    Status On-going      PT LONG TERM GOAL #3   Title Patient will demonstrate independent ambulation community distances > 500 ft to facilitate community integration at [facility].    Time 8    Period Weeks    Status On-going      PT LONG TERM GOAL #4   Title Pt. will demonstrate Lt ankle AROM within 85% of Lt to facilitate ability to ambulation, navigate stairs at PLOF.    Time 8    Period Weeks    Status On-going      PT LONG TERM GOAL #5   Title Pt. will demonstrate ability to lift/carry 100 lbs, ascending descend 8-10 inch stairs for work related activity at PLOF.    Time 8  Period Weeks    Status On-going      PT LONG TERM GOAL #6   Title Pt. will demonstrate return to work at [employer].    Time 8    Period Weeks    Status On-going                 Plan -  12/17/20 1342    Clinical Impression Statement Overall is progressing as expected and he shows good effort with PT and has good overall activity tolerance to strength progression today. updated measurements reflect progress in in Rt ankle ROM and strength but he is still significantly limited in this along with weight acceptance for normal gait. He will continue to benefit from PT and PT will continue to progress as able.    Examination-Activity Limitations Sit;Squat;Bend;Stairs;Stand;Transfers;Lift;Locomotion Level    Examination-Participation Restrictions Community Activity;Occupation;Shop;Driving;Yard Work    Stability/Clinical Decision Making Stable/Uncomplicated    Rehab Potential Good    PT Duration 8 weeks    PT Treatment/Interventions ADLs/Self Care Home Management;Cryotherapy;Electrical Stimulation;Iontophoresis 4mg /ml Dexamethasone;Moist Heat;Balance training;Therapeutic exercise;Manual techniques;Therapeutic activities;Functional mobility training;Stair training;Gait training;Ultrasound;Neuromuscular re-education;Patient/family education;DME Instruction;Passive range of motion;Dry needling;Spinal Manipulations;Joint Manipulations;Taping;Vasopneumatic Device    PT Next Visit Plan ROM and strength as able, what did MD say    PT Home Exercise Plan [email or web address]    Consulted and Agree with Plan of Care Patient           Patient will benefit from skilled therapeutic intervention in order to improve the following deficits and impairments:  Abnormal gait,Decreased endurance,Hypomobility,Increased edema,Decreased activity tolerance,Decreased strength,Pain,Difficulty walking,Decreased mobility,Decreased balance,Improper body mechanics,Impaired perceived functional ability,Impaired flexibility,Decreased coordination,Decreased range of motion  Visit Diagnosis: Pain in left ankle and joints of left foot  Stiffness of left ankle, not elsewhere classified  Muscle weakness (generalized)  Difficulty  in walking, not elsewhere classified  Localized edema     Problem List Patient Active Problem List   Diagnosis Date Noted  . S/P ORIF (open reduction internal fixation) fracture 11/05/2020  . Left ankle pain 11/05/2020  . Closed fracture of left ankle   . Periumbilical abdominal pain 12/17/2018  . Headache syndrome 09/28/2017  . Common migraine 09/28/2017  . Primary localized osteoarthritis of left knee 09/19/2017    11/19/2017 12/17/2020, 1:46 PM  [facility] Physical Therapy [address] [place], [place], [place] Phone: [phone]   Fax:  [phone]  Name: Thomas Burgess MRN: Katha Hamming Date of Birth: [birth year]

## 2020-12-17 NOTE — Progress Notes (Signed)
Office Visit Note   Patient: Thomas Burgess           Date of Birth: 03/11/1976           MRN: [id number] Visit Date: 12/17/2020              Requested by: Kaleen Mask, MD [address] [place],  [place] [address] PCP: Kaleen Mask, MD  Chief Complaint  Patient presents with  . Left Ankle - Routine Post Op    11/05/20 ORIF left ankle fx       HPI: This is a pleasant 44 year old gentleman who is 6 weeks status post open reduction internal fixation of his left ankle he has been working with physical therapy and is in a tall cam boot.  He complains mostly of pain along the medial side of the ankle and on the bottom of the foot  Assessment & Plan: Visit Diagnoses:  1. Pain in left ankle and joints of left foot     Plan: Patient should wean out of the boot.  I would like for him to get some arch supports to support his foot and thereby taking off stress from the posterior tibial tendon.  I do think he has some posterior tibial tendinitis which should improve with weaning out of the boot and wearing shoes with proper support continue current work restrictions until his next visit.  Follow-up with Dr. Lajoyce Corners in 4 weeks  Follow-Up Instructions: No follow-ups on file.   Ortho Exam  Patient is alert, oriented, no adenopathy, well-dressed, normal affect, normal respiratory effort. Focused examination of his left ankle demonstrates well-healed surgical incision.  He has good dorsiflexion plantarflexion eversion.  With resisted inversion she he does have pain along the course the posterior tibial tendon.  Arch is maintained.  No evidence of any infection  Imaging: No results found. No images are attached to the encounter.  Labs: No results found for: HGBA1C, ESRSEDRATE, CRP, LABURIC, REPTSTATUS, GRAMSTAIN, CULT, LABORGA   Lab Results  Component Value Date   ALBUMIN 4.7 12/26/2018   ALBUMIN 4.4 12/16/2018    No results found for: MG No results found for:  VD25OH  No results found for: PREALBUMIN CBC EXTENDED Latest Ref Rng & Units 11/05/2020 11/05/2020 12/26/2018  WBC 4.0 - 10.5 K/uL 6.5 - 8.0  RBC 4.22 - 5.81 MIL/uL 4.75 - 4.71  HGB 13.0 - 17.0 g/dL 81.1 57.2 62.0  HCT 35.5 - 52.0 % 44.5 40.0 42.8  PLT 150 - 400 K/uL 214 - 262     Body mass index is 31 kg/m.  Orders:  Orders Placed This Encounter  Procedures  . XR Ankle 2 Views Left   No orders of the defined types were placed in this encounter.    Procedures: No procedures performed  Clinical Data: No additional findings.  ROS:  All other systems negative, except as noted in the HPI. Review of Systems  Objective: Vital Signs: Ht 6\' 1"  (1.854 m)   Wt 235 lb (106.6 kg)   BMI 31.00 kg/m   Specialty Comments:  No specialty comments available.  PMFS History: Patient Active Problem List   Diagnosis Date Noted  . S/P ORIF (open reduction internal fixation) fracture 11/05/2020  . Left ankle pain 11/05/2020  . Closed fracture of left ankle   . Periumbilical abdominal pain 12/17/2018  . Headache syndrome 09/28/2017  . Common migraine 09/28/2017  . Primary localized osteoarthritis of left knee 09/19/2017   Past Medical History:  Diagnosis Date  . Common migraine 09/28/2017  . Dyspnea    when walking with crutches  . Family history of adverse reaction to anesthesia    mother/father- nausea  . Gout   . Headache syndrome 09/28/2017   Right occipital  . History of kidney stones    passed  . Hypothyroid   . Migraine    Control Emgatitin    Family History  Problem Relation Age of Onset  . Congestive Heart Failure Mother   . Diabetes Mother   . Kidney failure Father   . Brain cancer Cousin     Past Surgical History:  Procedure Laterality Date  . FEMUR SURGERY Left    repair with repair  . FEMUR SURGERY Left    hardware removal  . LAPAROSCOPIC APPENDECTOMY N/A 12/18/2018   Procedure: APPENDECTOMY LAPAROSCOPIC;  Surgeon: Almond Lint, MD;  Location: [facility]  ORS;  Service: General;  Laterality: N/A;  . 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 LEG SURGERY Left    Removed hardware  . ORIF ANKLE FRACTURE Left 11/05/2020   Procedure: OPEN REDUCTION INTERNAL FIXATION (ORIF) LEFT ANKLE FRACTURE;  Surgeon: Nadara Mustard, MD;  Location: [facility] OR;  Service: Orthopedics;  Laterality: Left;  . SHOULDER SURGERY Left    Frozen shoulder   Social History   Occupational History  . Occupation: [employer]  Tobacco Use  . Smoking status: Never Smoker  . Smokeless tobacco: Never Used  Vaping Use  . Vaping Use: Never used  Substance and Sexual Activity  . Alcohol use: No  . Drug use: No  . Sexual activity: Not Currently    Birth control/protection: None

## 2020-12-18 ENCOUNTER — Encounter: Payer: Self-pay | Admitting: Rehabilitative and Restorative Service Providers"

## 2020-12-18 ENCOUNTER — Ambulatory Visit (INDEPENDENT_AMBULATORY_CARE_PROVIDER_SITE_OTHER): Payer: Worker's Compensation | Admitting: Rehabilitative and Restorative Service Providers"

## 2020-12-18 DIAGNOSIS — R262 Difficulty in walking, not elsewhere classified: Secondary | ICD-10-CM | POA: Diagnosis not present

## 2020-12-18 DIAGNOSIS — M25572 Pain in left ankle and joints of left foot: Secondary | ICD-10-CM | POA: Diagnosis not present

## 2020-12-18 DIAGNOSIS — M6281 Muscle weakness (generalized): Secondary | ICD-10-CM | POA: Diagnosis not present

## 2020-12-18 DIAGNOSIS — M25672 Stiffness of left ankle, not elsewhere classified: Secondary | ICD-10-CM

## 2020-12-18 NOTE — Therapy (Signed)
[facility] [address] [place], [place], [address] Phone: [phone]   Fax:  [phone]  Physical Therapy Treatment  Patient Details  Name: FLEET HIGHAM MRN: [id number] Date of Birth: [birth year] Referring Provider (PT): [place] Persons, PA-C   Encounter Date: 12/18/2020   PT End of Session - 12/18/20 1346    Visit Number 5    Number of Visits 12    Date for PT Re-Evaluation 01/02/21    Authorization Type Workers [employer] 12 visits by 01/02/2021    Authorization - Visit Number 5    Authorization - Number of Visits 12    Progress Note Due on Visit 12    PT Start Time 1300    PT Stop Time 1345    PT Time Calculation (min) 45 min    Activity Tolerance Patient tolerated treatment well;No increased pain    Behavior During Therapy WFL for tasks assessed/performed           Past Medical History:  Diagnosis Date  . Common migraine 09/28/2017  . Dyspnea    when walking with crutches  . Family history of adverse reaction to anesthesia    mother/father- nausea  . Gout   . Headache syndrome 09/28/2017   Right occipital  . History of kidney stones    passed  . Hypothyroid   . Migraine    Control Emgatitin    Past Surgical History:  Procedure Laterality Date  . FEMUR SURGERY Left    repair with repair  . FEMUR SURGERY Left    hardware removal  . LAPAROSCOPIC APPENDECTOMY N/A 12/18/2018   Procedure: APPENDECTOMY LAPAROSCOPIC;  Surgeon: Almond Lint, MD;  Location: [facility];  Service: General;  Laterality: N/A;  . 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 LEG SURGERY Left    Removed hardware  . ORIF ANKLE FRACTURE Left 11/05/2020   Procedure: OPEN REDUCTION INTERNAL FIXATION (ORIF) LEFT ANKLE FRACTURE;  Surgeon: Nadara Mustard, MD;  Location: [facility] OR;  Service: Orthopedics;  Laterality: Left;  . SHOULDER SURGERY Left    Frozen shoulder    There were no vitals filed for this visit.   Subjective Assessment - 12/18/20 1342     Subjective Yesterday MD told Elber he could get back in a shoe and DC the boot.    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 Yes    Pain Score 7     Pain Location Ankle    Pain Orientation Left    Pain Descriptors / Indicators Aching;Tightness;Sore    Pain Type Surgical pain    Pain Onset More than a month ago    Pain Frequency Constant    Aggravating Factors  WB function    Pain Relieving Factors Rest, ice and exercises    Effect of Pain on Daily Activities Limits WB endurance and comfort.  Mace is doing light-duty at work.    Multiple Pain Sites No                             [facility] Adult PT Treatment/Exercise - 12/18/20 0001      Neuro Re-ed    Neuro Re-ed Details  Heel to toe balance eyes open, head turning and eyes closed 3X  each 20 seconds      Exercises   Exercises Ankle      Vasopneumatic   Number Minutes Vasopneumatic  10 minutes    Vasopnuematic Location  Ankle    Vasopneumatic Pressure High    Vasopneumatic Temperature  34      Ankle Exercises: Stretches   Soleus Stretch 3 reps;60 seconds   slant board slight toe in   Gastroc Stretch 3 reps;60 seconds   Slant board slight toe in     Ankle Exercises: Standing   Heel Raises Both;20 reps;3 seconds;Other (comment)   slow eccentrics   Toe Raise 20 reps;3 seconds   slow eccentrics     Ankle Exercises: Supine   T-Band Green theraband inversion & eversion 20X each                  PT Education - 12/18/20 1345    Education Details Updated and reviewed HEP.    Person(s) Educated Patient    Methods Explanation;Demonstration;Tactile cues;Verbal cues;Handout    Comprehension Verbal cues required;Need further instruction;Returned demonstration;Verbalized understanding;Tactile cues required             PT Short Term Goals - 12/17/20 1344      PT SHORT TERM GOAL #1   Title Patient will demonstrate independent use of home exercise program to maintain progress from in clinic treatments.    Time 3    Period Weeks    Status Achieved    Target Date 12/22/20      PT SHORT TERM GOAL #2   Title Pt. will demonstrate ambulation c boot s crutches community distances.    Status Achieved             PT Long Term Goals - 12/17/20 1346      PT LONG TERM GOAL #1   Title Patient will demonstrate/report pain at worst less than or equal to 2/10 to facilitate minimal limitation in daily activity secondary to pain symptoms.    Time 8    Period Weeks    Status On-going      PT LONG TERM GOAL #2   Title Patient will demonstrate independent use of home exercise program to facilitate ability to maintain/progress functional gains from skilled physical therapy services.    Time 8    Period Weeks    Status On-going      PT LONG TERM GOAL #3   Title Patient will demonstrate independent ambulation community distances > 500 ft to facilitate community integration at [facility].    Time 8    Period Weeks    Status On-going      PT LONG TERM GOAL #4   Title Pt. will demonstrate Lt ankle AROM within 85% of Lt to facilitate ability to ambulation, navigate stairs at PLOF.    Time 8    Period Weeks    Status On-going      PT LONG TERM GOAL #5   Title Pt. will demonstrate ability to lift/carry 100 lbs, ascending descend 8-10 inch stairs for work related activity at PLOF.    Time 8    Period Weeks    Status On-going      PT LONG TERM GOAL #6   Title Pt. will demonstrate return to work at [employer].    Time 8    Period Weeks    Status On-going                 Plan - 12/18/20 1351  Clinical Impression Statement Damani is now out of the boot and excited about returning to his normal job duties as soon as possible.  Ankle strength and dorsiflexion AROM remain high priorities.     Examination-Activity Limitations Sit;Squat;Bend;Stairs;Stand;Transfers;Lift;Locomotion Level    Examination-Participation Restrictions Community Activity;Occupation;Shop;Driving;Yard Work    Stability/Clinical Decision Making Stable/Uncomplicated    Rehab Potential Good    PT Duration 8 weeks    PT Treatment/Interventions ADLs/Self Care Home Management;Cryotherapy;Electrical Stimulation;Iontophoresis 4mg /ml Dexamethasone;Moist Heat;Balance training;Therapeutic exercise;Manual techniques;Therapeutic activities;Functional mobility training;Stair training;Gait training;Ultrasound;Neuromuscular re-education;Patient/family education;DME Instruction;Passive range of motion;Dry needling;Spinal Manipulations;Joint Manipulations;Taping;Vasopneumatic Device    PT Next Visit Plan Progress strength and proprioception    PT Home Exercise Plan [email or web address]    Consulted and Agree with Plan of Care Patient           Patient will benefit from skilled therapeutic intervention in order to improve the following deficits and impairments:  Abnormal gait,Decreased endurance,Hypomobility,Increased edema,Decreased activity tolerance,Decreased strength,Pain,Difficulty walking,Decreased mobility,Decreased balance,Improper body mechanics,Impaired perceived functional ability,Impaired flexibility,Decreased coordination,Decreased range of motion  Visit Diagnosis: Difficulty walking  Muscle weakness (generalized)  Stiffness of left ankle, not elsewhere classified  Pain in left ankle and joints of left foot     Problem List Patient Active Problem List   Diagnosis Date Noted  . S/P ORIF (open reduction internal fixation) fracture 11/05/2020  . Left ankle pain 11/05/2020  . Closed fracture of left ankle   . Periumbilical abdominal pain 12/17/2018  . Headache syndrome 09/28/2017  . Common migraine 09/28/2017  . Primary localized osteoarthritis of left knee 09/19/2017    11/19/2017 PT, MPT 12/18/2020, 1:53  PM  [facility] [address] [place], [place], [place] Phone: [phone]   Fax:  [phone]  Name: JAECOB LOWDEN MRN: Katha Hamming Date of Birth: Jun 02, 1976

## 2020-12-23 ENCOUNTER — Other Ambulatory Visit: Payer: Self-pay

## 2020-12-23 ENCOUNTER — Encounter: Payer: Self-pay | Admitting: Rehabilitative and Restorative Service Providers"

## 2020-12-23 ENCOUNTER — Ambulatory Visit (INDEPENDENT_AMBULATORY_CARE_PROVIDER_SITE_OTHER): Payer: Worker's Compensation | Admitting: Rehabilitative and Restorative Service Providers"

## 2020-12-23 DIAGNOSIS — R600 Localized edema: Secondary | ICD-10-CM

## 2020-12-23 DIAGNOSIS — M25572 Pain in left ankle and joints of left foot: Secondary | ICD-10-CM | POA: Diagnosis not present

## 2020-12-23 DIAGNOSIS — R262 Difficulty in walking, not elsewhere classified: Secondary | ICD-10-CM

## 2020-12-23 DIAGNOSIS — M25672 Stiffness of left ankle, not elsewhere classified: Secondary | ICD-10-CM | POA: Diagnosis not present

## 2020-12-23 DIAGNOSIS — M6281 Muscle weakness (generalized): Secondary | ICD-10-CM

## 2020-12-23 NOTE — Therapy (Signed)
[facility] [address] [place], [place], [address] Phone: [phone]   Fax:  [phone]  Physical Therapy Treatment/Progress Note  Patient Details  Name: Thomas Burgess MRN: [id number] Date of Birth: October 14, 1976 Referring Provider (PT): Bevely Palmer Persons, PA-C   Encounter Date: 12/23/2020   Progress Note Reporting Period 11/21/2020 to 12/23/2020  See note below for Objective Data and Assessment of Progress/Goals.        PT End of Session - 12/23/20 1500    Visit Number 6    Number of Visits 12    Date for PT Re-Evaluation 01/02/21    Authorization Type Workers Comp 12 visits by 01/02/2021    Authorization - Visit Number 6    Authorization - Number of Visits 12    Progress Note Due on Visit 12    PT Start Time 4627    PT Stop Time 1546    PT Time Calculation (min) 50 min    Activity Tolerance Patient limited by pain    Behavior During Therapy [facility] for tasks assessed/performed           Past Medical History:  Diagnosis Date  . Common migraine 09/28/2017  . Dyspnea    when walking with crutches  . Family history of adverse reaction to anesthesia    mother/father- nausea  . Gout   . Headache syndrome 09/28/2017   Right occipital  . History of kidney stones    passed  . Hypothyroid   . Migraine    Control Emgatitin    Past Surgical History:  Procedure Laterality Date  . FEMUR SURGERY Left    repair with repair  . FEMUR SURGERY Left    hardware removal  . LAPAROSCOPIC APPENDECTOMY N/A 12/18/2018   Procedure: APPENDECTOMY LAPAROSCOPIC;  Surgeon: Stark Klein, MD;  Location: [facility];  Service: General;  Laterality: N/A;  . 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 LEG SURGERY Left    Removed hardware  . ORIF ANKLE FRACTURE Left 11/05/2020   Procedure: OPEN REDUCTION INTERNAL FIXATION (ORIF) LEFT ANKLE FRACTURE;  Surgeon: Newt Minion, MD;  Location: [facility];  Service: Orthopedics;  Laterality: Left;  . SHOULDER  SURGERY Left    Frozen shoulder    There were no vitals filed for this visit.   Subjective Assessment - 12/23/20 1458    Subjective Pt. indicated similar daily soreness/pain complaints but nothing worse.  Pt. indicated some restless leg feeling at times.  GROC rated at +6 a great deal better.    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 Yes    Pain Location Ankle    Pain Orientation Left    Pain Descriptors / Indicators Tightness;Sore    Pain Onset More than a month ago    Aggravating Factors  increased c weight bearing activity    Effect of Pain on Daily Activities Standing, walking, stairs, squatting (work activity)              [facility] PT Assessment - 12/23/20 0001      Assessment   Medical Diagnosis Closed fracture c ORIF Lt ankle    Referring Provider (PT) Bevely Palmer Persons, PA-C    Onset Date/Surgical Date 11/05/20    Hand Dominance Right      Precautions  Precaution Comments WBAT outside of boot      Observation/Other Assessments   Focus on Therapeutic Outcomes (FOTO)  update 22%      Observation/Other Assessments-Edema    Edema Figure 8      Figure 8 Edema   Figure 8 - Left  23 inch      AROM   Left Ankle Dorsiflexion 9    Left Ankle Plantar Flexion 42    Left Ankle Inversion 18    Left Ankle Eversion 8      PROM   Left Ankle Dorsiflexion 10    Left Ankle Inversion 26    Left Ankle Eversion 20      Strength   Left Ankle Dorsiflexion 4/5    Left Ankle Plantar Flexion 2+/5   unable to perform any heel raise single leg on Lt Le   Left Ankle Inversion 4/5    Left Ankle Eversion 4/5      Ambulation/Gait   Gait Pattern Decreased stance time - left;Decreased dorsiflexion - left;Antalgic    Gait Comments Independent ambulation                          [facility] Adult PT Treatment/Exercise - 12/23/20 0001      Vasopneumatic   Number Minutes Vasopneumatic  10 minutes    Vasopnuematic Location  Ankle    Vasopneumatic Pressure High    Vasopneumatic Temperature  34      Manual Therapy   Manual therapy comments MWM c ap sustained mob talocrural jt c DF on step 2 x 10      Ankle Exercises: Standing   Other Standing Ankle Exercises Fwd step up 6 inch 20 x Lt LE 1st      Ankle Exercises: Stretches   Gastroc Stretch 5 reps;30 seconds   runner stretch on incline board     Ankle Exercises: Aerobic   Recumbent Bike Lvl 3 10 mins      Ankle Exercises: Seated   Heel Raises Right   3 x 15   Other Seated Ankle Exercises DF blue band 3x15                    PT Short Term Goals - 12/17/20 1344      PT SHORT TERM GOAL #1   Title Patient will demonstrate independent use of home exercise program to maintain progress from in clinic treatments.    Time 3    Period Weeks    Status Achieved    Target Date 12/22/20      PT SHORT TERM GOAL #2   Title Pt. will demonstrate ambulation c boot s crutches community distances.    Status Achieved             PT Long Term Goals - 12/23/20 1531      PT LONG TERM GOAL #1   Title Patient will demonstrate/report pain at worst less than or equal to 2/10 to facilitate minimal limitation in daily activity secondary to pain symptoms.    Time 8    Period Weeks    Status On-going    Target Date 01/26/21      PT LONG TERM GOAL #2   Title Patient will demonstrate independent use of home exercise program to facilitate ability to maintain/progress functional gains from skilled physical therapy services.    Time 8    Period Weeks    Status On-going    Target Date 01/26/21  PT LONG TERM GOAL #3   Title Patient will demonstrate independent ambulation community distances > 500 ft to facilitate community integration at [facility].    Time 8    Period Weeks    Status  On-going    Target Date 01/26/21      PT LONG TERM GOAL #4   Title Pt. will demonstrate Lt ankle AROM within 85% of Lt to facilitate ability to ambulation, navigate stairs at PLOF.    Time 8    Period Weeks    Status On-going    Target Date 01/26/21      PT LONG TERM GOAL #5   Title Pt. will demonstrate ability to lift/carry 100 lbs, ascending descend 8-10 inch stairs for work related activity at PLOF.    Time 8    Period Weeks    Status On-going    Target Date 01/26/21      PT LONG TERM GOAL #6   Title Pt. will demonstrate return to work at [employer].    Time 8    Period Weeks    Status Not Met    Target Date 01/26/21                 Plan - 12/23/20 1531    Clinical Impression Statement Pt. has attended 6 visits overall during course of treatment, reporting +6 on GROC at this time.  See objective data for updated information regarding current presentation, showing gains in active movement, strength, ambulation and balance control.  Pt. does continue to demonstrate and report noted symptoms in Lt ankle/foot c continued swelling noted  c mobility, strength/movement coordination deficits present that impair daily and work related activity at this time.  Continued skilled PT services indicated at this time.    Examination-Activity Limitations Sit;Squat;Bend;Stairs;Stand;Transfers;Lift;Locomotion Level    Examination-Participation Restrictions Community Activity;Occupation;Shop;Driving;Yard Work    Stability/Clinical Decision Making Stable/Uncomplicated    Rehab Potential Good    PT Duration 8 weeks    PT Treatment/Interventions ADLs/Self Care Home Management;Cryotherapy;Electrical Stimulation;Iontophoresis 46m/ml Dexamethasone;Moist Heat;Balance training;Therapeutic exercise;Manual techniques;Therapeutic activities;Functional mobility training;Stair training;Gait training;Ultrasound;Neuromuscular re-education;Patient/family education;DME Instruction;Passive range of motion;Dry  needling;Spinal Manipulations;Joint Manipulations;Taping;Vasopneumatic Device    PT Next Visit Plan Continue to monitor swelling and symptom response.  manual intervention for neurological and mobility gains, strengthening and balance progression based off symptoms.    PT Home Exercise Plan [email or web address]    Consulted and Agree with Plan of Care Patient           Patient will benefit from skilled therapeutic intervention in order to improve the following deficits and impairments:  Abnormal gait,Decreased endurance,Hypomobility,Increased edema,Decreased activity tolerance,Decreased strength,Pain,Difficulty walking,Decreased mobility,Decreased balance,Improper body mechanics,Impaired perceived functional ability,Impaired flexibility,Decreased coordination,Decreased range of motion  Visit Diagnosis: Pain in left ankle and joints of left foot  Stiffness of left ankle, not elsewhere classified  Muscle weakness (generalized)  Difficulty in walking, not elsewhere classified  Localized edema     Problem List Patient Active Problem List   Diagnosis Date Noted  . S/P ORIF (open reduction internal fixation) fracture 11/05/2020  . Left ankle pain 11/05/2020  . Closed fracture of left ankle   . Periumbilical abdominal pain 12/17/2018  . Headache syndrome 09/28/2017  . Common migraine 09/28/2017  . Primary localized osteoarthritis of left knee 09/19/2017   MScot Jun PT, DPT, OCS, ATC 12/23/20  3:40 PM    [facility] [address] [address]Phone: [phone]  Fax:  [phone] Name: PKHIREE BUKHARIMRN:  [id number] Date of Birth: October 09, 1976

## 2020-12-24 ENCOUNTER — Telehealth: Payer: Self-pay

## 2020-12-24 MED ORDER — NURTEC 75 MG PO TBDP: 75.0000 mg | ORAL_TABLET | ORAL | 5 refills | Status: DC | PRN

## 2020-12-24 NOTE — Telephone Encounter (Signed)
I did not see that a PA had been completed recently for [facility] so I have submitted on CMM, Key: [id number].   Awaiting determination.

## 2020-12-24 NOTE — Telephone Encounter (Signed)
[employer] quantity limit is #8 per 30 days. Is it ok to send new Rx for Qty #8?

## 2020-12-24 NOTE — Addendum Note (Signed)
Addended by: Rocky Link A on: 12/24/2020 03:09 PM   Modules accepted: Orders

## 2020-12-25 ENCOUNTER — Other Ambulatory Visit: Payer: Self-pay

## 2020-12-25 ENCOUNTER — Ambulatory Visit (INDEPENDENT_AMBULATORY_CARE_PROVIDER_SITE_OTHER): Payer: Worker's Compensation | Admitting: Rehabilitative and Restorative Service Providers"

## 2020-12-25 DIAGNOSIS — M25672 Stiffness of left ankle, not elsewhere classified: Secondary | ICD-10-CM | POA: Diagnosis not present

## 2020-12-25 DIAGNOSIS — M6281 Muscle weakness (generalized): Secondary | ICD-10-CM

## 2020-12-25 DIAGNOSIS — R262 Difficulty in walking, not elsewhere classified: Secondary | ICD-10-CM

## 2020-12-25 DIAGNOSIS — R600 Localized edema: Secondary | ICD-10-CM

## 2020-12-25 DIAGNOSIS — M25572 Pain in left ankle and joints of left foot: Secondary | ICD-10-CM | POA: Diagnosis not present

## 2020-12-25 NOTE — Therapy (Signed)
[facility] Physical Therapy [address] [place], [place], [address] Phone: [phone]   Fax:  [phone]  Physical Therapy Treatment  Patient Details  Name: Thomas Burgess MRN: [id number] Date of Birth: 10-06-1976 Referring Provider (PT): Bevely Palmer Persons, PA-C   Encounter Date: 12/25/2020   PT End of Session - 12/25/20 1506    Visit Number 7    Number of Visits 12    Date for PT Re-Evaluation 01/02/21    Authorization Type Workers [employer] 12 visits by 01/02/2021    Authorization - Visit Number 7    Authorization - Number of Visits 12    Progress Note Due on Visit 12    PT Start Time 1093    PT Stop Time 1550    PT Time Calculation (min) 36 min    Activity Tolerance Patient limited by pain    Behavior During Therapy [facility] for tasks assessed/performed           Past Medical History:  Diagnosis Date  . Common migraine 09/28/2017  . Dyspnea    when walking with crutches  . Family history of adverse reaction to anesthesia    mother/father- nausea  . Gout   . Headache syndrome 09/28/2017   Right occipital  . History of kidney stones    passed  . Hypothyroid   . Migraine    Control Emgatitin    Past Surgical History:  Procedure Laterality Date  . FEMUR SURGERY Left    repair with repair  . FEMUR SURGERY Left    hardware removal  . LAPAROSCOPIC APPENDECTOMY N/A 12/18/2018   Procedure: APPENDECTOMY LAPAROSCOPIC;  Surgeon: Stark Klein, MD;  Location: [facility];  Service: General;  Laterality: N/A;  . 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 LEG SURGERY Left    Removed hardware  . ORIF ANKLE FRACTURE Left 11/05/2020   Procedure: OPEN REDUCTION INTERNAL FIXATION (ORIF) LEFT ANKLE FRACTURE;  Surgeon: Newt Minion, MD;  Location: [facility];  Service: Orthopedics;  Laterality: Left;  . SHOULDER SURGERY Left    Frozen shoulder    There were no vitals filed for this visit.   Subjective Assessment - 12/25/20 1517    Subjective Pt. indicated  continued pain similar to last time but noted additional soreness in calf and also in ankle joints.    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 Yes    Pain Score 6     Pain Location Ankle    Pain Orientation Left    Pain Descriptors / Indicators Aching;Tightness;Sore    Pain Onset More than a month ago    Pain Frequency Constant                             [facility] Adult PT Treatment/Exercise - 12/25/20 0001      Neuro Re-ed    Neuro Re-ed Details  tandem ambulation fwd/back on foam 8 ft x 5 each way, fitter rocker board fwd/back 30 x      Modalities   Modalities Electrical Stimulation      Electrical Stimulation   Electrical Stimulation Location Lt ankle    Electrical Stimulation Action IFC    Electrical Stimulation Parameters to tolerance 10 mins    Electrical  Stimulation Goals Pain      Vasopneumatic   Number Minutes Vasopneumatic  10 minutes    Vasopnuematic Location  Ankle    Vasopneumatic Pressure High    Vasopneumatic Temperature  34      Ankle Exercises: Aerobic   Recumbent Bike Lvl 5 12 mins      Ankle Exercises: Seated   BAPS Level 2;Sitting   fwd/back 30 x, circles cw, ccw 20 x each                   PT Short Term Goals - 12/17/20 1344      PT SHORT TERM GOAL #1   Title Patient will demonstrate independent use of home exercise program to maintain progress from in clinic treatments.    Time 3    Period Weeks    Status Achieved    Target Date 12/22/20      PT SHORT TERM GOAL #2   Title Pt. will demonstrate ambulation c boot s crutches community distances.    Status Achieved             PT Long Term Goals - 12/23/20 1531      PT LONG TERM GOAL #1   Title Patient will demonstrate/report pain at worst less than or equal to  2/10 to facilitate minimal limitation in daily activity secondary to pain symptoms.    Time 8    Period Weeks    Status On-going    Target Date 01/26/21      PT LONG TERM GOAL #2   Title Patient will demonstrate independent use of home exercise program to facilitate ability to maintain/progress functional gains from skilled physical therapy services.    Time 8    Period Weeks    Status On-going    Target Date 01/26/21      PT LONG TERM GOAL #3   Title Patient will demonstrate independent ambulation community distances > 500 ft to facilitate community integration at [facility].    Time 8    Period Weeks    Status On-going    Target Date 01/26/21      PT LONG TERM GOAL #4   Title Pt. will demonstrate Lt ankle AROM within 85% of Lt to facilitate ability to ambulation, navigate stairs at PLOF.    Time 8    Period Weeks    Status On-going    Target Date 01/26/21      PT LONG TERM GOAL #5   Title Pt. will demonstrate ability to lift/carry 100 lbs, ascending descend 8-10 inch stairs for work related activity at PLOF.    Time 8    Period Weeks    Status On-going    Target Date 01/26/21      PT LONG TERM GOAL #6   Title Pt. will demonstrate return to work at [employer].    Time 8    Period Weeks    Status Not Met    Target Date 01/26/21                 Plan - 12/25/20 1531    Clinical Impression Statement Despite function mobility gains, continued pain response noted which impairs ambulation c antalgic gait as well as progression in WB strengthening activity.    Examination-Activity Limitations Sit;Squat;Bend;Stairs;Stand;Transfers;Lift;Locomotion Level    Examination-Participation Restrictions Community Activity;Occupation;Shop;Driving;Yard Work    Stability/Clinical Decision Making Stable/Uncomplicated    Rehab Potential Good    PT Duration 8 weeks    PT Treatment/Interventions ADLs/Self  Care Home Management;Cryotherapy;Electrical Stimulation;Iontophoresis 56m/ml  Dexamethasone;Moist Heat;Balance training;Therapeutic exercise;Manual techniques;Therapeutic activities;Functional mobility training;Stair training;Gait training;Ultrasound;Neuromuscular re-education;Patient/family education;DME Instruction;Passive range of motion;Dry needling;Spinal Manipulations;Joint Manipulations;Taping;Vasopneumatic Device    PT Next Visit Plan Continue to monitor swelling and symptom response.  manual intervention for neurological and mobility gains, strengthening and balance progression based off symptoms.    PT Home Exercise Plan [email or web address]    Consulted and Agree with Plan of Care Patient           Patient will benefit from skilled therapeutic intervention in order to improve the following deficits and impairments:  Abnormal gait,Decreased endurance,Hypomobility,Increased edema,Decreased activity tolerance,Decreased strength,Pain,Difficulty walking,Decreased mobility,Decreased balance,Improper body mechanics,Impaired perceived functional ability,Impaired flexibility,Decreased coordination,Decreased range of motion  Visit Diagnosis: Pain in left ankle and joints of left foot  Stiffness of left ankle, not elsewhere classified  Muscle weakness (generalized)  Difficulty in walking, not elsewhere classified  Localized edema     Problem List Patient Active Problem List   Diagnosis Date Noted  . S/P ORIF (open reduction internal fixation) fracture 11/05/2020  . Left ankle pain 11/05/2020  . Closed fracture of left ankle   . Periumbilical abdominal pain 12/17/2018  . Headache syndrome 09/28/2017  . Common migraine 09/28/2017  . Primary localized osteoarthritis of left knee 09/19/2017    MScot Jun PT, DPT, OCS, ATC 12/25/20  3:45 PM    [facility]sPhysical Therapy [address]rad[place] [address]Phone: [phone]  Fax:  [phone] Name: Thomas HYNDMANMRN: [id number] of Birth: 1Mar 12, 1977

## 2020-12-29 NOTE — Telephone Encounter (Signed)
APPROVED  [employer] has been approved through [place] from 12/24/20-12/24/21.

## 2020-12-29 NOTE — Telephone Encounter (Signed)
Noted  

## 2020-12-29 NOTE — Telephone Encounter (Signed)
Check with pharmacy to cancel rizatriptan.

## 2020-12-29 NOTE — Telephone Encounter (Signed)
Spoke to pt and relayed that approval for emgality PA, for a yeat. 12-24-2021.  Another PA done for nurtec [id number] initiated stopped rizatriptan determination pending up to 5 days.  he appreciated any help.

## 2020-12-30 ENCOUNTER — Other Ambulatory Visit: Payer: Self-pay | Admitting: Orthopaedic Surgery

## 2020-12-30 ENCOUNTER — Other Ambulatory Visit: Payer: Self-pay | Admitting: Physician Assistant

## 2020-12-31 ENCOUNTER — Encounter: Payer: Self-pay | Admitting: Rehabilitative and Restorative Service Providers"

## 2020-12-31 ENCOUNTER — Other Ambulatory Visit: Payer: Self-pay

## 2020-12-31 ENCOUNTER — Ambulatory Visit (INDEPENDENT_AMBULATORY_CARE_PROVIDER_SITE_OTHER): Payer: Worker's Compensation | Admitting: Rehabilitative and Restorative Service Providers"

## 2020-12-31 DIAGNOSIS — M25572 Pain in left ankle and joints of left foot: Secondary | ICD-10-CM | POA: Diagnosis not present

## 2020-12-31 DIAGNOSIS — R600 Localized edema: Secondary | ICD-10-CM

## 2020-12-31 DIAGNOSIS — M6281 Muscle weakness (generalized): Secondary | ICD-10-CM | POA: Diagnosis not present

## 2020-12-31 DIAGNOSIS — M25672 Stiffness of left ankle, not elsewhere classified: Secondary | ICD-10-CM | POA: Diagnosis not present

## 2020-12-31 DIAGNOSIS — R262 Difficulty in walking, not elsewhere classified: Secondary | ICD-10-CM | POA: Diagnosis not present

## 2020-12-31 NOTE — Telephone Encounter (Signed)
Received approval for [facility] for [place] PA # [id number]  12-29-20 thru 12-29-2021 of #16 units for each fill.

## 2020-12-31 NOTE — Therapy (Signed)
[facility] Physical Therapy [address] [place], [place], [address] Phone: [phone]   Fax:  [phone]  Physical Therapy Treatment  Patient Details  Name: Thomas Burgess MRN: [id number] Date of Birth: March 04, 1976 Referring Provider (PT): Bevely Palmer Persons, PA-C   Encounter Date: 12/31/2020   PT End of Session - 12/31/20 1621    Visit Number 8    Number of Visits 12    Date for PT Re-Evaluation 01/02/21    Authorization Type Workers Comp 12 visits by 01/02/2021    Authorization - Visit Number 8    Authorization - Number of Visits 12    Progress Note Due on Visit 12    PT Start Time 9024    PT Stop Time 1612    PT Time Calculation (min) 55 min    Activity Tolerance Patient tolerated treatment well;Patient limited by fatigue    Behavior During Therapy [facility] for tasks assessed/performed           Past Medical History:  Diagnosis Date  . Common migraine 09/28/2017  . Dyspnea    when walking with crutches  . Family history of adverse reaction to anesthesia    mother/father- nausea  . Gout   . Headache syndrome 09/28/2017   Right occipital  . History of kidney stones    passed  . Hypothyroid   . Migraine    Control Emgatitin    Past Surgical History:  Procedure Laterality Date  . FEMUR SURGERY Left    repair with repair  . FEMUR SURGERY Left    hardware removal  . LAPAROSCOPIC APPENDECTOMY N/A 12/18/2018   Procedure: APPENDECTOMY LAPAROSCOPIC;  Surgeon: Stark Klein, MD;  Location[facility];  Service: General;  Laterality: N/A;  . 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 LEG SURGERY Left    Removed hardware  . ORIF ANKLE FRACTURE Left 11/05/2020   Procedure: OPEN REDUCTION INTERNAL FIXATION (ORIF) LEFT ANKLE FRACTURE;  Surgeon: Newt Minion, MD;  Location: [facility];  Service: Orthopedics;  Laterality: Left;  . SHOULDER SURGERY Left    Frozen shoulder    There were no vitals filed for this visit.   Subjective Assessment - 12/31/20  1617    Subjective Thomas Burgess reports doing "about 100" heel raises per day.    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 Yes    Pain Score 6     Pain Location Ankle    Pain Orientation Left    Pain Descriptors / Indicators Aching;Sore;Tightness    Pain Type Surgical pain    Pain Onset More than a month ago    Pain Frequency Constant    Aggravating Factors  Activities late in the day and with weight-bearing    Pain Relieving Factors Rest, ice and exercises    Effect of Pain on Daily Activities Standing, walking, stairs, squatting (normal work activities)    Multiple Pain Sites No                             [facility] Adult PT Treatment/Exercise - 12/31/20 0001      Therapeutic Activites    Therapeutic Activities ADL's    ADL's Step-up and over 4 inch 2 sets of 20  Neuro Re-ed    Neuro Re-ed Details  Single leg balance eyes open 2 sets of 5X 10 seconds and heel to toe walking (forward and backward) 2 laps each      Exercises   Exercises Ankle      Vasopneumatic   Number Minutes Vasopneumatic  10 minutes    Vasopnuematic Location  Ankle    Vasopneumatic Pressure High    Vasopneumatic Temperature  34      Ankle Exercises: Stretches   Soleus Stretch 3 reps;60 seconds   slant board slight toe in   Gastroc Stretch 3 reps;60 seconds   Slant board slight toe in     Ankle Exercises: Standing   Heel Raises Both;20 reps;3 seconds;Other (comment)   slow eccentrics   Toe Raise 20 reps;3 seconds   slow eccentrics     Ankle Exercises: Supine   T-Band Blue theraband inversion & eversion 2 sets of 20X each                  PT Education - 12/31/20 1619    Education Details Reviewed HEP, progressed balance and functional activities.    Person(s) Educated Patient     Methods Explanation;Demonstration;Verbal cues    Comprehension Verbalized understanding;Need further instruction;Returned demonstration;Verbal cues required            PT Short Term Goals - 12/31/20 1620      PT SHORT TERM GOAL #1   Title Patient will demonstrate independent use of home exercise program to maintain progress from in clinic treatments.    Time 3    Period Weeks    Status Achieved    Target Date 12/22/20      PT SHORT TERM GOAL #2   Title Pt. will demonstrate ambulation c boot s crutches community distances.    Status Achieved             PT Long Term Goals - 12/31/20 1620      PT LONG TERM GOAL #1   Title Patient will demonstrate/report pain at worst less than or equal to 2/10 to facilitate minimal limitation in daily activity secondary to pain symptoms.    Time 8    Period Weeks    Status On-going      PT LONG TERM GOAL #2   Title Patient will demonstrate independent use of home exercise program to facilitate ability to maintain/progress functional gains from skilled physical therapy services.    Time 8    Period Weeks    Status On-going      PT LONG TERM GOAL #3   Title Patient will demonstrate independent ambulation community distances > 500 ft to facilitate community integration at [facility].    Time 8    Period Weeks    Status On-going      PT LONG TERM GOAL #4   Title Pt. will demonstrate Lt ankle AROM within 85% of Lt to facilitate ability to ambulation, navigate stairs at PLOF.    Time 8    Period Weeks    Status On-going      PT LONG TERM GOAL #5   Title Pt. will demonstrate ability to lift/carry 100 lbs, ascending descend 8-10 inch stairs for work related activity at PLOF.    Time 8    Period Weeks    Status On-going      PT LONG TERM GOAL #6   Title Pt. will demonstrate return to work at [employer].    Time 8  Period Weeks    Status Not Met                 Plan - 12/31/20 1622    Clinical Impression Statement Declin reports  good compliance with his home strengthening exercises.  Proprioception and stairs looked better as the visit progressed.  Also recommended Thomas Burgess purchase or build a heel cords box to stand on to improve ankle DF AROM.    Examination-Activity Limitations Sit;Squat;Bend;Stairs;Stand;Transfers;Lift;Locomotion Level    Examination-Participation Restrictions Community Activity;Occupation;Shop;Driving;Yard Work    Stability/Clinical Decision Making Stable/Uncomplicated    Rehab Potential Good    PT Frequency 2x / week    PT Duration 8 weeks    PT Treatment/Interventions ADLs/Self Care Home Management;Cryotherapy;Electrical Stimulation;Iontophoresis 60m/ml Dexamethasone;Moist Heat;Balance training;Therapeutic exercise;Manual techniques;Therapeutic activities;Functional mobility training;Stair training;Gait training;Ultrasound;Neuromuscular re-education;Patient/family education;DME Instruction;Passive range of motion;Dry needling;Spinal Manipulations;Joint Manipulations;Taping;Vasopneumatic Device    PT Next Visit Plan Strength, balance and dorsiflexion AROM focus    PT Home Exercise Plan [email or web address]    Consulted and Agree with Plan of Care Patient           Patient will benefit from skilled therapeutic intervention in order to improve the following deficits and impairments:  Abnormal gait,Decreased endurance,Hypomobility,Increased edema,Decreased activity tolerance,Decreased strength,Pain,Difficulty walking,Decreased mobility,Decreased balance,Improper body mechanics,Impaired perceived functional ability,Impaired flexibility,Decreased coordination,Decreased range of motion  Visit Diagnosis: Difficulty walking  Muscle weakness (generalized)  Stiffness of left ankle, not elsewhere classified  Pain in left ankle and joints of left foot  Localized edema     Problem List Patient Active Problem List   Diagnosis Date Noted  . S/P ORIF (open reduction internal fixation) fracture 11/05/2020  . Left  ankle pain 11/05/2020  . Closed fracture of left ankle   . Periumbilical abdominal pain 12/17/2018  . Headache syndrome 09/28/2017  . Common migraine 09/28/2017  . Primary localized osteoarthritis of left knee 09/19/2017    RFarley LyPT, MPT 12/31/2020, 4:25 PM  [facility]Physical Therapy [address] [address]Phone: [phone]  Fax:  [phone] Name: PROBINSON BRINKLEYMRN: [id number] of Birth: [birth year]

## 2021-01-02 ENCOUNTER — Other Ambulatory Visit: Payer: Self-pay

## 2021-01-02 ENCOUNTER — Encounter: Payer: Self-pay | Admitting: Rehabilitative and Restorative Service Providers"

## 2021-01-02 ENCOUNTER — Ambulatory Visit (INDEPENDENT_AMBULATORY_CARE_PROVIDER_SITE_OTHER): Payer: Worker's Compensation | Admitting: Rehabilitative and Restorative Service Providers"

## 2021-01-02 DIAGNOSIS — R262 Difficulty in walking, not elsewhere classified: Secondary | ICD-10-CM

## 2021-01-02 DIAGNOSIS — M25572 Pain in left ankle and joints of left foot: Secondary | ICD-10-CM

## 2021-01-02 DIAGNOSIS — M6281 Muscle weakness (generalized): Secondary | ICD-10-CM

## 2021-01-02 DIAGNOSIS — M25672 Stiffness of left ankle, not elsewhere classified: Secondary | ICD-10-CM | POA: Diagnosis not present

## 2021-01-02 DIAGNOSIS — R600 Localized edema: Secondary | ICD-10-CM

## 2021-01-02 NOTE — Therapy (Signed)
[facility] Physical Therapy [address] [place], [place], [address] Phone: [phone]   Fax:  [phone]  Physical Therapy Treatment  Patient Details  Name: Thomas Burgess MRN: [id number] Date of Birth: 26-Apr-1976 Referring Provider (PT): Bevely Palmer Persons, PA-C   Encounter Date: 01/02/2021   PT End of Session - 01/02/21 1600    Visit Number 9    Number of Visits 12    Date for PT Re-Evaluation 01/02/21    Authorization Type Workers Comp 12 visits by 01/02/2021    Authorization - Visit Number 9    Authorization - Number of Visits 12    Progress Note Due on Visit 12    PT Start Time 1856    PT Stop Time 1609    PT Time Calculation (min) 62 min    Activity Tolerance Patient tolerated treatment well;Patient limited by fatigue    Behavior During Therapy [facility] for tasks assessed/performed           Past Medical History:  Diagnosis Date  . Common migraine 09/28/2017  . Dyspnea    when walking with crutches  . Family history of adverse reaction to anesthesia    mother/father- nausea  . Gout   . Headache syndrome 09/28/2017   Right occipital  . History of kidney stones    passed  . Hypothyroid   . Migraine    Control Emgatitin    Past Surgical History:  Procedure Laterality Date  . FEMUR SURGERY Left    repair with repair  . FEMUR SURGERY Left    hardware removal  . LAPAROSCOPIC APPENDECTOMY N/A 12/18/2018   Procedure: APPENDECTOMY LAPAROSCOPIC;  Surgeon: Stark Klein, MD;  Location: [facility];  Service: General;  Laterality: N/A;  . 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 LEG SURGERY Left    Removed hardware  . ORIF ANKLE FRACTURE Left 11/05/2020   Procedure: OPEN REDUCTION INTERNAL FIXATION (ORIF) LEFT ANKLE FRACTURE;  Surgeon: Newt Minion, MD;  Location: [place];  Service: Orthopedics;  Laterality: Left;  . SHOULDER SURGERY Left    Frozen shoulder    There were no vitals filed for this visit.   Subjective Assessment - 01/02/21  1558    Subjective Darrly reports good HEP compliance using a modified slant board along with his heel raises.    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 Yes    Pain Score 5     Pain Location Ankle    Pain Orientation Left    Pain Descriptors / Indicators Aching;Sore;Tightness    Pain Type Surgical pain    Pain Onset More than a month ago    Pain Frequency Constant    Multiple Pain Sites No              [facility] PT Assessment - 01/02/21 0001      AROM   Right Ankle Dorsiflexion 15    Left Ankle Dorsiflexion 10      Strength   Overall Strength Deficits    Strength Assessment Site Ankle    Left Ankle Dorsiflexion 4+/5    Left Ankle Plantar Flexion 2+/5    Left Ankle Inversion 4+/5    Left Ankle Eversion 4/5  [facility] Adult PT Treatment/Exercise - 01/02/21 0001      Therapeutic Activites    Therapeutic Activities ADL's    ADL's Step-up and over 8 inch 2 sets of 20 slow eccentrics      Neuro Re-ed    Neuro Re-ed Details  Single leg balance eyes open 5X 10 seconds and heel to toe walking (forward and backward) 2 laps each      Exercises   Exercises Ankle      Vasopneumatic   Number Minutes Vasopneumatic  10 minutes    Vasopnuematic Location  Ankle    Vasopneumatic Pressure High    Vasopneumatic Temperature  34      Ankle Exercises: Stretches   Soleus Stretch 3 reps;60 seconds   slant board slight toe in   Gastroc Stretch 3 reps;60 seconds   Slant board slight toe in     Ankle Exercises: Machines for Strengthening   Cybex Leg Press Heel Raises on leg press double leg 150# 20X and L only 50# 2 sets of 15 slow eccentrics      Ankle Exercises: Standing   Heel Raises Both;20 reps;3 seconds;Other (comment)   slow eccentrics   Toe Raise  20 reps;3 seconds   slow eccentrics     Ankle Exercises: Supine   T-Band Blue theraband inversion & eversion 2 sets of 20X each                  PT Education - 01/02/21 1559    Education Details Reviewed and progressed clinic program.  Reinforced HEP (slant board and heel raises).    Person(s) Educated Patient    Methods Explanation;Demonstration;Verbal cues    Comprehension Returned demonstration;Need further instruction;Verbalized understanding;Verbal cues required            PT Short Term Goals - 01/02/21 1600      PT SHORT TERM GOAL #1   Title Patient will demonstrate independent use of home exercise program to maintain progress from in clinic treatments.    Time 3    Period Weeks    Status Achieved    Target Date 12/22/20      PT SHORT TERM GOAL #2   Title Pt. will demonstrate ambulation c boot s crutches community distances.    Status Achieved             PT Long Term Goals - 01/02/21 1600      PT LONG TERM GOAL #1   Title Patient will demonstrate/report pain at worst less than or equal to 2/10 to facilitate minimal limitation in daily activity secondary to pain symptoms.    Time 8    Period Weeks    Status On-going      PT LONG TERM GOAL #2   Title Patient will demonstrate independent use of home exercise program to facilitate ability to maintain/progress functional gains from skilled physical therapy services.    Time 8    Period Weeks    Status Partially Met      PT LONG TERM GOAL #3   Title Patient will demonstrate independent ambulation community distances > 500 ft to facilitate community integration at [facility].    Time 8    Period Weeks    Status Partially Met      PT LONG TERM GOAL #4   Title Pt. will demonstrate Lt ankle AROM within 85% of Lt to facilitate ability to ambulation, navigate stairs at PLOF.    Time 8    Period Weeks  Status On-going      PT LONG TERM GOAL #5   Title Pt. will demonstrate ability to lift/carry 100 lbs,  ascending descend 8-10 inch stairs for work related activity at PLOF.    Time 8    Period Weeks    Status On-going      PT LONG TERM GOAL #6   Title Pt. will demonstrate return to work at [employer].    Time 8    Period Weeks    Status Not Met                 Plan - 01/02/21 1601    Clinical Impression Statement Frederich continues to make objective progress towards goals established at evaluation.  L dorsiflexion AROM is excellent at 10 degrees (15 degrees right).  Ankle strength is improving although significant calf atrophy and proprioceptive deficits affect gait and weight-bearing function.  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    Examination-Activity Limitations Sit;Squat;Bend;Stairs;Stand;Transfers;Lift;Locomotion Level    Examination-Participation Restrictions Community Activity;Occupation;Shop;Driving;Yard Work    Stability/Clinical Decision Making Stable/Uncomplicated    Rehab Potential Good    PT Frequency 2x / week    PT Duration 4 weeks    PT Treatment/Interventions ADLs/Self Care Home Management;Cryotherapy;Electrical Stimulation;Iontophoresis 65m/ml Dexamethasone;Moist Heat;Balance training;Therapeutic exercise;Manual techniques;Therapeutic activities;Functional mobility training;Stair training;Gait training;Ultrasound;Neuromuscular re-education;Patient/family education;DME Instruction;Passive range of motion;Dry needling;Spinal Manipulations;Joint Manipulations;Taping;Vasopneumatic Device    PT Next Visit Plan Strength, balance and dorsiflexion AROM focus    PT Home Exercise Plan [email or web address]    Consulted and Agree with Plan of Care Patient           Patient will benefit from skilled therapeutic intervention in order to improve the following deficits and impairments:  Abnormal gait,Decreased endurance,Hypomobility,Increased edema,Decreased activity tolerance,Decreased strength,Pain,Difficulty walking,Decreased  mobility,Decreased balance,Improper body mechanics,Impaired perceived functional ability,Impaired flexibility,Decreased coordination,Decreased range of motion  Visit Diagnosis: Difficulty walking  Muscle weakness (generalized)  Stiffness of left ankle, not elsewhere classified  Pain in left ankle and joints of left foot  Localized edema     Problem List Patient Active Problem List   Diagnosis Date Noted  . S/P ORIF (open reduction internal fixation) fracture 11/05/2020  . Left ankle pain 11/05/2020  . Closed fracture of left ankle   . Periumbilical abdominal pain 12/17/2018  . Headache syndrome 09/28/2017  . Common migraine 09/28/2017  . Primary localized osteoarthritis of left knee 09/19/2017    RFarley LyPT, MPT 01/02/2021, 4:27 PM  CThe [facility] LLCPhysical Therapy [address] [address]Phone: [phone]  Fax:  [phone] Name: PANDRUE DINIMRN: [id number] of Birth: [birth year]

## 2021-01-06 ENCOUNTER — Encounter: Payer: Worker's Compensation | Admitting: Rehabilitative and Restorative Service Providers"

## 2021-01-08 ENCOUNTER — Encounter: Payer: BC Managed Care – PPO | Admitting: Rehabilitative and Restorative Service Providers"

## 2021-01-12 ENCOUNTER — Ambulatory Visit (INDEPENDENT_AMBULATORY_CARE_PROVIDER_SITE_OTHER): Payer: Worker's Compensation | Admitting: Rehabilitative and Restorative Service Providers"

## 2021-01-12 ENCOUNTER — Other Ambulatory Visit: Payer: Self-pay

## 2021-01-12 ENCOUNTER — Encounter: Payer: Self-pay | Admitting: Rehabilitative and Restorative Service Providers"

## 2021-01-12 DIAGNOSIS — M25672 Stiffness of left ankle, not elsewhere classified: Secondary | ICD-10-CM

## 2021-01-12 DIAGNOSIS — R600 Localized edema: Secondary | ICD-10-CM

## 2021-01-12 DIAGNOSIS — M6281 Muscle weakness (generalized): Secondary | ICD-10-CM

## 2021-01-12 DIAGNOSIS — R262 Difficulty in walking, not elsewhere classified: Secondary | ICD-10-CM

## 2021-01-12 DIAGNOSIS — M25572 Pain in left ankle and joints of left foot: Secondary | ICD-10-CM | POA: Diagnosis not present

## 2021-01-12 NOTE — Therapy (Signed)
[facility] Physical Therapy [address] [place], [place], [address] Phone: [phone]   Fax:  [phone]  Physical Therapy Treatment  Patient Details  Name: Thomas Burgess MRN: [id number] Date of Birth: 1975-12-21 Referring Provider (PT): Bevely Palmer Persons, PA-C   Encounter Date: 01/12/2021   PT End of Session - 01/12/21 1427    Visit Number 10    Number of Visits 12    Date for PT Re-Evaluation 01/20/21    Authorization Type Workers Comp 12 visits by 01/20/2021    Authorization - Visit Number 10    Authorization - Number of Visits 12    Progress Note Due on Visit 12    PT Start Time 1505    PT Stop Time 1515    PT Time Calculation (min) 50 min    Activity Tolerance Patient tolerated treatment well    Behavior During Therapy [facility] for tasks assessed/performed           Past Medical History:  Diagnosis Date  . Common migraine 09/28/2017  . Dyspnea    when walking with crutches  . Family history of adverse reaction to anesthesia    mother/father- nausea  . Gout   . Headache syndrome 09/28/2017   Right occipital  . History of kidney stones    passed  . Hypothyroid   . Migraine    Control Emgatitin    Past Surgical History:  Procedure Laterality Date  . FEMUR SURGERY Left    repair with repair  . FEMUR SURGERY Left    hardware removal  . LAPAROSCOPIC APPENDECTOMY N/A 12/18/2018   Procedure: APPENDECTOMY LAPAROSCOPIC;  Surgeon: Stark Klein, MD;  Location: [facility];  Service: General;  Laterality: N/A;  . 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 LEG SURGERY Left    Removed hardware  . ORIF ANKLE FRACTURE Left 11/05/2020   Procedure: OPEN REDUCTION INTERNAL FIXATION (ORIF) LEFT ANKLE FRACTURE;  Surgeon: Newt Minion, MD;  Location: [place];  Service: Orthopedics;  Laterality: Left;  . SHOULDER SURGERY Left    Frozen shoulder    There were no vitals filed for this visit.   Subjective Assessment - 01/12/21 1426    Subjective Pt.  indicated having most complaints in anterior ankle and in middle of bottom of foot.    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 Yes    Pain Score 6     Pain Location Ankle    Pain Orientation Left    Pain Descriptors / Indicators Aching;Sore;Tightness    Pain Type Surgical pain    Pain Onset More than a month ago    Aggravating Factors  restless with sitting, walking, standing/WB activity              [facility] PT Assessment - 01/12/21 0001      Assessment   Medical Diagnosis Closed fracture c ORIF Lt ankle    Referring Provider (PT) Bevely Palmer Persons, PA-C    Onset Date/Surgical Date 11/05/20    Hand Dominance Right      AROM   Left Ankle Dorsiflexion 10    Left Ankle Inversion 20    Left Ankle Eversion 16      Strength   Left Ankle Dorsiflexion 4+/5    Left Ankle Plantar Flexion  2+/5    Left Ankle Inversion 4+/5    Left Ankle Eversion 4/5                         [facility] Adult PT Treatment/Exercise - 01/12/21 0001      Neuro Re-ed    Neuro Re-ed Details  3 point slider(anterior/posteroir/lateral) x 10 bilateral, lateral stepping 3 cones x 10 each bilateral      Vasopneumatic   Number Minutes Vasopneumatic  10 minutes    Vasopnuematic Location  Ankle    Vasopneumatic Pressure High    Vasopneumatic Temperature  34      Manual Therapy   Manual therapy comments MWM c DF c ap talocrural mob 2 x 10      Ankle Exercises: Aerobic   Recumbent Bike Lvl 4 10 mins      Ankle Exercises: Standing   Other Standing Ankle Exercises Lateral step up 8 inch 3 x 10 bilateral      Ankle Exercises: Seated   Other Seated Ankle Exercises PF, inversion, eversion blue band 30 x each way                    PT Short Term Goals - 01/02/21 1600      PT SHORT TERM  GOAL #1   Title Patient will demonstrate independent use of home exercise program to maintain progress from in clinic treatments.    Time 3    Period Weeks    Status Achieved    Target Date 12/22/20      PT SHORT TERM GOAL #2   Title Pt. will demonstrate ambulation c boot s crutches community distances.    Status Achieved             PT Long Term Goals - 01/02/21 1600      PT LONG TERM GOAL #1   Title Patient will demonstrate/report pain at worst less than or equal to 2/10 to facilitate minimal limitation in daily activity secondary to pain symptoms.    Time 8    Period Weeks    Status On-going      PT LONG TERM GOAL #2   Title Patient will demonstrate independent use of home exercise program to facilitate ability to maintain/progress functional gains from skilled physical therapy services.    Time 8    Period Weeks    Status Partially Met      PT LONG TERM GOAL #3   Title Patient will demonstrate independent ambulation community distances > 500 ft to facilitate community integration at [facility].    Time 8    Period Weeks    Status Partially Met      PT LONG TERM GOAL #4   Title Pt. will demonstrate Lt ankle AROM within 85% of Lt to facilitate ability to ambulation, navigate stairs at PLOF.    Time 8    Period Weeks    Status On-going      PT LONG TERM GOAL #5   Title Pt. will demonstrate ability to lift/carry 100 lbs, ascending descend 8-10 inch stairs for work related activity at PLOF.    Time 8    Period Weeks    Status On-going      PT LONG TERM GOAL #6   Title Pt. will demonstrate return to work at [employer].    Time 8    Period Weeks    Status Not Met  Plan - 01/12/21 1441    Clinical Impression Statement As documented on last visit, Pt. has demonstrated progression towrads goals objectively in regards to mobility and strength data.  Continued complaints of pain in WB activity that prohibits full return to PLOF at this time despite  improvements in strength. Pt. has 2 more approved visits at this time, return to MD tomorrow.  May continue to benefit from skilled PT services and would need additional authorization.    Examination-Activity Limitations Sit;Squat;Bend;Stairs;Stand;Transfers;Lift;Locomotion Level    Examination-Participation Restrictions Community Activity;Occupation;Shop;Driving;Yard Work    Stability/Clinical Decision Making Stable/Uncomplicated    Rehab Potential Good    PT Frequency 2x / week    PT Duration 4 weeks    PT Treatment/Interventions ADLs/Self Care Home Management;Cryotherapy;Electrical Stimulation;Iontophoresis 41m/ml Dexamethasone;Moist Heat;Balance training;Therapeutic exercise;Manual techniques;Therapeutic activities;Functional mobility training;Stair training;Gait training;Ultrasound;Neuromuscular re-education;Patient/family education;DME Instruction;Passive range of motion;Dry needling;Spinal Manipulations;Joint Manipulations;Taping;Vasopneumatic Device    PT Next Visit Plan Strength, balance and dorsiflexion AROM focus    PT Home Exercise Plan [id number]    Consulted and Agree with Plan of Care Patient           Patient will benefit from skilled therapeutic intervention in order to improve the following deficits and impairments:  Abnormal gait,Decreased endurance,Hypomobility,Increased edema,Decreased activity tolerance,Decreased strength,Pain,Difficulty walking,Decreased mobility,Decreased balance,Improper body mechanics,Impaired perceived functional ability,Impaired flexibility,Decreased coordination,Decreased range of motion  Visit Diagnosis: Pain in left ankle and joints of left foot  Difficulty walking  Muscle weakness (generalized)  Stiffness of left ankle, not elsewhere classified  Localized edema     Problem List Patient Active Problem List   Diagnosis Date Noted  . S/P ORIF (open reduction internal fixation) fracture 11/05/2020  . Left ankle pain 11/05/2020  . Closed  fracture of left ankle   . Periumbilical abdominal pain 12/17/2018  . Headache syndrome 09/28/2017  . Common migraine 09/28/2017  . Primary localized osteoarthritis of left knee 09/19/2017    MScot Jun PT, DPT, OCS, ATC 01/12/21  2:59 PM    C[facility] LLCPhysical Therapy [address] [address]Phone: [phone]  Fax:  [phone] Name: Thomas TREFZMRN: [id number] of Birth: 103-05-1976

## 2021-01-13 ENCOUNTER — Ambulatory Visit (INDEPENDENT_AMBULATORY_CARE_PROVIDER_SITE_OTHER): Payer: Worker's Compensation

## 2021-01-13 ENCOUNTER — Encounter: Payer: Self-pay | Admitting: Orthopedic Surgery

## 2021-01-13 ENCOUNTER — Ambulatory Visit (INDEPENDENT_AMBULATORY_CARE_PROVIDER_SITE_OTHER): Payer: Worker's Compensation | Admitting: Physician Assistant

## 2021-01-13 DIAGNOSIS — M25572 Pain in left ankle and joints of left foot: Secondary | ICD-10-CM

## 2021-01-13 MED ORDER — GABAPENTIN 300 MG PO CAPS: 300.0000 mg | ORAL_CAPSULE | Freq: Three times a day (TID) | ORAL | 3 refills | Status: DC

## 2021-01-13 NOTE — Progress Notes (Signed)
Office Visit Note   Patient: Thomas Burgess           Date of Birth: March 22, 1976           MRN: [id number] Visit Date: 01/13/2021              Requested by: Kaleen Mask, MD [address] [place],  [place] [address] PCP: Kaleen Mask, MD  Chief Complaint  Patient presents with  . Left Ankle - Follow-up  . Left Foot - Follow-up      HPI: Patient is now 2 months status post open reduction internal fixation of his left ankle.  He continues to complain of pain on the medial side of his ankle but more specifically in the heel.  He describes this as a burning nerve type pain.  It bothers him all the time not just with a rising or weightbearing.  He believes it is an irritated nerve.  He continues to have some swelling.  He is working with physical therapy has found the TENS unit seems to relieve a lot of his symptoms  Assessment & Plan: Visit Diagnoses:  1. Pain in left ankle and joints of left foot     Plan: We will try the patient on a course of gabapentin he will work up to 300 mg 3 times daily.  Also I think he would benefit from a compression sock to reduce some of the swelling and help his nerve pain as well.  He will wear an extra-large vive compression stocking.  Physical therapy continues to be medically necessary as he is very weak especially with internal and external rotators plantarflexion and dorsiflexion.  He will follow-up with Dr. Lajoyce Corners in 1 month.  Also encouraged him to get an arch support for his current shoe  Follow-Up Instructions: No follow-ups on file.   Ortho Exam  Patient is alert, oriented, no adenopathy, well-dressed, normal affect, normal respiratory effort. Examination he has a palpable dorsalis pedis pulse he does have moderate soft tissue swelling but no cellulitis no erythema he has a well-healed surgical incision.  He is very weak with resisted internal rotation he is better with external rotation some weakness with plantar flexion.   He has some altered sensation along the bottom of his foot.  Imaging: No results found. No images are attached to the encounter.  Labs: No results found for: HGBA1C, ESRSEDRATE, CRP, LABURIC, REPTSTATUS, GRAMSTAIN, CULT, LABORGA   Lab Results  Component Value Date   ALBUMIN 4.7 12/26/2018   ALBUMIN 4.4 12/16/2018    No results found for: MG No results found for: VD25OH  No results found for: PREALBUMIN CBC EXTENDED Latest Ref Rng & Units 11/05/2020 11/05/2020 12/26/2018  WBC 4.0 - 10.5 K/uL 6.5 - 8.0  RBC 4.22 - 5.81 MIL/uL 4.75 - 4.71  HGB 13.0 - 17.0 g/dL 54.2 70.6 23.7  HCT 62.8 - 52.0 % 44.5 40.0 42.8  PLT 150 - 400 K/uL 214 - 262     There is no height or weight on file to calculate BMI.  Orders:  Orders Placed This Encounter  Procedures  . XR Ankle Complete Left  . XR Foot Complete Left   No orders of the defined types were placed in this encounter.    Procedures: No procedures performed  Clinical Data: No additional findings.  ROS:  All other systems negative, except as noted in the HPI. Review of Systems  Objective: Vital Signs: There were no vitals taken for this  visit.  Specialty Comments:  No specialty comments available.  PMFS History: Patient Active Problem List   Diagnosis Date Noted  . S/P ORIF (open reduction internal fixation) fracture 11/05/2020  . Left ankle pain 11/05/2020  . Closed fracture of left ankle   . Periumbilical abdominal pain 12/17/2018  . Headache syndrome 09/28/2017  . Common migraine 09/28/2017  . Primary localized osteoarthritis of left knee 09/19/2017   Past Medical History:  Diagnosis Date  . Common migraine 09/28/2017  . Dyspnea    when walking with crutches  . Family history of adverse reaction to anesthesia    mother/father- nausea  . Gout   . Headache syndrome 09/28/2017   Right occipital  . History of kidney stones    passed  . Hypothyroid   . Migraine    Control Emgatitin    Family History   Problem Relation Age of Onset  . Congestive Heart Failure Mother   . Diabetes Mother   . Kidney failure Father   . Brain cancer Cousin     Past Surgical History:  Procedure Laterality Date  . FEMUR SURGERY Left    repair with repair  . FEMUR SURGERY Left    hardware removal  . LAPAROSCOPIC APPENDECTOMY N/A 12/18/2018   Procedure: APPENDECTOMY LAPAROSCOPIC;  Surgeon: Almond Lint, MD;  [facility];  Service: General;  Laterality: N/A;  . 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 LEG SURGERY Left    Removed hardware  . ORIF ANKLE FRACTURE Left 11/05/2020   Procedure: OPEN REDUCTION INTERNAL FIXATION (ORIF) LEFT ANKLE FRACTURE;  Surgeon: Nadara Mustard, MD;  Location: [facility];  Service: Orthopedics;  Laterality: Left;  . SHOULDER SURGERY Left    Frozen shoulder   Social History   Occupational History  . Occupation: [employer]  Tobacco Use  . Smoking status: Never Smoker  . Smokeless tobacco: Never Used  Vaping Use  . Vaping Use: Never used  Substance and Sexual Activity  . Alcohol use: No  . Drug use: No  . Sexual activity: Not Currently    Birth control/protection: None

## 2021-01-16 ENCOUNTER — Encounter: Payer: Self-pay | Admitting: Rehabilitative and Restorative Service Providers"

## 2021-01-16 ENCOUNTER — Telehealth: Payer: Self-pay | Admitting: Orthopedic Surgery

## 2021-01-16 ENCOUNTER — Ambulatory Visit (INDEPENDENT_AMBULATORY_CARE_PROVIDER_SITE_OTHER): Payer: Worker's Compensation | Admitting: Rehabilitative and Restorative Service Providers"

## 2021-01-16 ENCOUNTER — Other Ambulatory Visit: Payer: Self-pay

## 2021-01-16 DIAGNOSIS — M25672 Stiffness of left ankle, not elsewhere classified: Secondary | ICD-10-CM | POA: Diagnosis not present

## 2021-01-16 DIAGNOSIS — M6281 Muscle weakness (generalized): Secondary | ICD-10-CM | POA: Diagnosis not present

## 2021-01-16 DIAGNOSIS — M25572 Pain in left ankle and joints of left foot: Secondary | ICD-10-CM

## 2021-01-16 DIAGNOSIS — R262 Difficulty in walking, not elsewhere classified: Secondary | ICD-10-CM

## 2021-01-16 DIAGNOSIS — R600 Localized edema: Secondary | ICD-10-CM

## 2021-01-16 NOTE — Telephone Encounter (Signed)
Ok for new order?

## 2021-01-16 NOTE — Therapy (Signed)
[facility] [address] [place], [place], [address] Phone: [phone]   Fax:  [phone]  Physical Therapy Treatment/Progress Note/Recertifcation/Worker's Comp Approval Request  Patient Details  Name: Thomas Burgess MRN: [id number] Date of Birth: 1976/10/14 Referring Provider (PT): West Bali Persons, PA-C   Encounter Date: 01/16/2021 Progress Note Reporting Period 01/02/2021 to 01/16/2021  See note below for Objective Data and Assessment of Progress/Goals.         PT End of Session - 01/16/21 1302    Visit Number 11    Number of Visits 12    Date for PT Re-Evaluation 02/27/21    Authorization Type Workers Comp 12 visits by 01/20/2021.  Request for additional 2x/week for 6 weeks today.    Authorization - Visit Number 11    Authorization - Number of Visits 12    Progress Note Due on Visit --    PT Start Time 1300    PT Stop Time 1350    PT Time Calculation (min) 50 min    Activity Tolerance Patient tolerated treatment well    Behavior During Therapy WFL for tasks assessed/performed           Past Medical History:  Diagnosis Date   Common migraine 09/28/2017   Dyspnea    when walking with crutches   Family history of adverse reaction to anesthesia    mother/father- nausea   Gout    Headache syndrome 09/28/2017   Right occipital   History of kidney stones    passed   Hypothyroid    Migraine    Control Emgatitin    Past Surgical History:  Procedure Laterality Date   FEMUR SURGERY Left    repair with repair   FEMUR SURGERY Left    hardware removal   LAPAROSCOPIC APPENDECTOMY N/A 12/18/2018   Procedure: APPENDECTOMY LAPAROSCOPIC;  Surgeon: Almond Lint, MD;  Location: [facility];  Service: General;  Laterality: N/A;   LEG SURGERY Bilateral 2006   Broke both legs   LEG SURGERY Left    Removed hardware   ORIF ANKLE FRACTURE Left 11/05/2020   Procedure: OPEN REDUCTION INTERNAL FIXATION (ORIF) LEFT ANKLE  FRACTURE;  Surgeon: Nadara Mustard, MD;  Location: [facility] OR;  Service: Orthopedics;  Laterality: Left;   SHOULDER SURGERY Left    Frozen shoulder    There were no vitals filed for this visit.   Subjective Assessment - 01/16/21 1259    Subjective Pt. stated seeing MD office with indications to continue therapy and will return to MD in about a month or so.  Pt. indicated he was still going to be on light duty as well.    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 Yes    Pain Score 4     Pain Location Ankle   pins/needles on bottom foot   Pain Orientation Left    Pain Descriptors / Indicators Aching;Sore;Tightness    Pain Onset More than a month ago    Pain Frequency Constant              [facility] PT Assessment - 01/16/21 0001      Assessment   Medical Diagnosis Closed fracture c ORIF Lt ankle    Referring Provider (PT) West Bali Persons, PA-C    Onset Date/Surgical Date 11/05/20  Hand Dominance Right      Observation/Other Assessments   Focus on Therapeutic Outcomes (FOTO)  update 52%      Single Leg Stance   Comments Lt SLS 7 seconds      AROM   Left Ankle Dorsiflexion 10    Left Ankle Inversion 20    Left Ankle Eversion 16      Strength   Left Ankle Dorsiflexion 5/5    Left Ankle Plantar Flexion 2+/5    Left Ankle Inversion 5/5    Left Ankle Eversion 5/5      Ambulation/Gait   Gait Comments Independent ambulation s device, reduced toe off progression, stance phase on Lt LE compared to Rt, antalgic gait                         [facility] Adult PT Treatment/Exercise - 01/16/21 0001      Neuro Re-ed    Neuro Re-ed Details  tandem ambulation fwd/back 15 ft on floor each, 3 cone slider anterior/lateral/posterior x 10 each LE      Vasopneumatic   Number  Minutes Vasopneumatic  10 minutes    Vasopnuematic Location  Ankle    Vasopneumatic Pressure High    Vasopneumatic Temperature  34      Ankle Exercises: Machines for Strengthening   Cybex Leg Press SL leg press 75 lbs 3 x 10, performed bilateral.  Heel raise SL 3 x 10      Ankle Exercises: Aerobic   Recumbent Bike Lvl 4 10 mins                    PT Short Term Goals - 01/02/21 1600      PT SHORT TERM GOAL #1   Title Patient will demonstrate independent use of home exercise program to maintain progress from in clinic treatments.    Time 3    Period Weeks    Status Achieved    Target Date 12/22/20      PT SHORT TERM GOAL #2   Title Pt. will demonstrate ambulation c boot s crutches community distances.    Status Achieved             PT Long Term Goals - 01/16/21 1317      PT LONG TERM GOAL #1   Title Patient will demonstrate/report pain at worst less than or equal to 2/10 to facilitate minimal limitation in daily activity secondary to pain symptoms.    Time 6    Period Weeks    Status Revised    Target Date 02/27/21      PT LONG TERM GOAL #2   Title Patient will demonstrate independent use of home exercise program to facilitate ability to maintain/progress functional gains from skilled physical therapy services.    Time 6    Period Weeks    Status Revised    Target Date 02/27/21      PT LONG TERM GOAL #3   Title Patient will demonstrate independent ambulation community distances > 500 ft to facilitate community integration at [facility].    Period Weeks    Status Achieved      PT LONG TERM GOAL #4   Title Pt. will demonstrate Lt ankle AROM within 85% of Lt to facilitate ability to ambulation, navigate stairs at PLOF.    Time 6    Period Weeks    Status Revised    Target Date 02/27/21      PT  LONG TERM GOAL #5   Title Pt. will demonstrate ability to lift/carry 100 lbs, ascending descend 8-10 inch stairs for work related activity at PLOF.    Time 6     Period Weeks    Status Revised    Target Date 02/27/21      PT LONG TERM GOAL #6   Title Pt. will demonstrate return to work at [employer].    Time 6    Period Weeks    Status Revised    Target Date 02/27/21                 Plan - 01/16/21 1307    Clinical Impression Statement Pt. has attended 11 visits overall( unable to get 12 visit by expiration date).  See objective data for updated information showing gains towards goals but continued impairment paired c symptoms that limit ability to perform usual daily, recreational and work activity at this time s limitation/symptoms.  Recent MD visit in agreement with physical therapy clinic plan to request additional skilled PT services at this time to continue to address impairment to progress towards goals.  Extension of care indicated at this time c progression partially limited due to severity of symptoms/presentation and continued evidence of LE edema.  Recommend continued skilled PT services 2x/week for 6 weeks through next MD visit.    Examination-Activity Limitations Sit;Squat;Bend;Stairs;Stand;Transfers;Lift;Locomotion Level    Examination-Participation Restrictions Community Activity;Occupation;Shop;Driving;Yard Work    Stability/Clinical Decision Making Stable/Uncomplicated    Rehab Potential Good    PT Frequency 2x / week    PT Duration 6 weeks    PT Treatment/Interventions ADLs/Self Care Home Management;Cryotherapy;Electrical Stimulation;Iontophoresis 4mg /ml Dexamethasone;Moist Heat;Balance training;Therapeutic exercise;Manual techniques;Therapeutic activities;Functional mobility training;Stair training;Gait training;Ultrasound;Neuromuscular re-education;Patient/family education;DME Instruction;Passive range of motion;Dry needling;Spinal Manipulations;Joint Manipulations;Taping;Vasopneumatic Device    PT Next Visit Plan Strength, balance and dorsiflexion AROM focus.  Additional visit approval required.    PT Home Exercise Plan [email or web address]     Consulted and Agree with Plan of Care Patient           Patient will benefit from skilled therapeutic intervention in order to improve the following deficits and impairments:  Abnormal gait,Decreased endurance,Hypomobility,Increased edema,Decreased activity tolerance,Decreased strength,Pain,Difficulty walking,Decreased mobility,Decreased balance,Improper body mechanics,Impaired perceived functional ability,Impaired flexibility,Decreased coordination,Decreased range of motion  Visit Diagnosis: Pain in left ankle and joints of left foot  Muscle weakness (generalized)  Stiffness of left ankle, not elsewhere classified  Difficulty walking  Localized edema     Problem List Patient Active Problem List   Diagnosis Date Noted   S/P ORIF (open reduction internal fixation) fracture 11/05/2020   Left ankle pain 11/05/2020   Closed fracture of left ankle    Periumbilical abdominal pain 12/17/2018   Headache syndrome 09/28/2017   Common migraine 09/28/2017   Primary localized osteoarthritis of left knee 09/19/2017    11/19/2017, PT, DPT, OCS, ATC 01/16/21  1:33 PM    [facility] [address] [place], [place], [place] Phone: [phone]   Fax:  [phone]  Name: Thomas Burgess MRN: Katha Hamming Date of Birth: 1976/08/20

## 2021-01-16 NOTE — Telephone Encounter (Signed)
Patient's CW Thomas Burgess, would like an updated PT referral in order to approve more PT visits. Fax number [phone]

## 2021-01-19 NOTE — Addendum Note (Signed)
Addended by: Polly Cobia on: 01/19/2021 02:47 PM   Modules accepted: Orders

## 2021-01-19 NOTE — Telephone Encounter (Signed)
This has been done.

## 2021-01-19 NOTE — Telephone Encounter (Signed)
yes

## 2021-01-23 ENCOUNTER — Encounter: Payer: BC Managed Care – PPO | Admitting: Rehabilitative and Restorative Service Providers"

## 2021-01-27 ENCOUNTER — Ambulatory Visit (INDEPENDENT_AMBULATORY_CARE_PROVIDER_SITE_OTHER): Payer: Worker's Compensation | Admitting: Rehabilitative and Restorative Service Providers"

## 2021-01-27 ENCOUNTER — Encounter: Payer: Self-pay | Admitting: Rehabilitative and Restorative Service Providers"

## 2021-01-27 ENCOUNTER — Other Ambulatory Visit: Payer: Self-pay

## 2021-01-27 DIAGNOSIS — R262 Difficulty in walking, not elsewhere classified: Secondary | ICD-10-CM | POA: Diagnosis not present

## 2021-01-27 DIAGNOSIS — M6281 Muscle weakness (generalized): Secondary | ICD-10-CM | POA: Diagnosis not present

## 2021-01-27 DIAGNOSIS — R600 Localized edema: Secondary | ICD-10-CM

## 2021-01-27 DIAGNOSIS — M25672 Stiffness of left ankle, not elsewhere classified: Secondary | ICD-10-CM | POA: Diagnosis not present

## 2021-01-27 DIAGNOSIS — M25572 Pain in left ankle and joints of left foot: Secondary | ICD-10-CM | POA: Diagnosis not present

## 2021-01-27 NOTE — Therapy (Signed)
[facility] Physical Therapy [address] [place], [place], [address] Phone: [phone]   Fax:  [phone]  Physical Therapy Treatment  Patient Details  Name: Thomas Burgess MRN: [id number] Date of Birth: December 30, 1975 Referring Provider (PT): West Bali Persons, PA-C   Encounter Date: 01/27/2021   PT End of Session - 01/27/21 1605    Visit Number 12    Number of Visits 19    Date for PT Re-Evaluation 02/27/21    Authorization Type Workers comp 8 visits until 02/16/2021    Authorization - Visit Number 1    Authorization - Number of Visits 8    PT Start Time 1555    PT Stop Time 1645    PT Time Calculation (min) 50 min    Activity Tolerance Patient tolerated treatment well    Behavior During Therapy [facility] for tasks assessed/performed           Past Medical History:  Diagnosis Date  . Common migraine 09/28/2017  . Dyspnea    when walking with crutches  . Family history of adverse reaction to anesthesia    mother/father- nausea  . Gout   . Headache syndrome 09/28/2017   Right occipital  . History of kidney stones    passed  . Hypothyroid   . Migraine    Control Emgatitin    Past Surgical History:  Procedure Laterality Date  . FEMUR SURGERY Left    repair with repair  . FEMUR SURGERY Left    hardware removal  . LAPAROSCOPIC APPENDECTOMY N/A 12/18/2018   Procedure: APPENDECTOMY LAPAROSCOPIC;  Surgeon: Almond Lint, MD;  Location: [facility];  Service: General;  Laterality: N/A;  . 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 LEG SURGERY Left    Removed hardware  . ORIF ANKLE FRACTURE Left 11/05/2020   Procedure: OPEN REDUCTION INTERNAL FIXATION (ORIF) LEFT ANKLE FRACTURE;  Surgeon: Nadara Mustard, MD;  Location: [facility] OR;  Service: Orthopedics;  Laterality: Left;  . SHOULDER SURGERY Left    Frozen shoulder    There were no vitals filed for this visit.   Subjective Assessment - 01/27/21 1603    Subjective Pt. stated feeling less complaints from  bottom of foot but still there.  Top of foot still noticed.    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 Yes    Pain Score 6     Pain Location Ankle    Pain Orientation Left;Anterior    Pain Descriptors / Indicators Aching;Tightness;Sore    Pain Type Surgical pain    Pain Onset More than a month ago    Pain Frequency Constant    Aggravating Factors  long distance walking/standing    Pain Relieving Factors rest    Effect of Pain on Daily Activities Limited in lift, carrying, stairs, walking prolonged                             [facility] Adult PT Treatment/Exercise - 01/27/21 0001      Neuro Re-ed    Neuro Re-ed Details  tandem ambulation on foam in // bars 8 ft x 5 fwd/rev each, caroca exercise 15 ft to Lt and 15 ft to Rt, fitter board fwd/back DL 25K  Vasopneumatic   Number Minutes Vasopneumatic  10 minutes    Vasopnuematic Location  Ankle    Vasopneumatic Pressure High    Vasopneumatic Temperature  34      Manual Therapy   Manual therapy comments MWM c DF c ap talocrural mob 2 x 10      Ankle Exercises: Aerobic   Recumbent Bike Lvl 3 10 mins      Ankle Exercises: Machines for Strengthening   Cybex Leg Press SL leg press 75 lbs 3 x 10, performed bilateral.  Heel raise SL 3 x 10      Ankle Exercises: Standing   Heel Raises 5 reps;Right   eccentric lowering, pain limited                   PT Short Term Goals - 01/02/21 1600      PT SHORT TERM GOAL #1   Title Patient will demonstrate independent use of home exercise program to maintain progress from in clinic treatments.    Time 3    Period Weeks    Status Achieved    Target Date 12/22/20      PT SHORT TERM GOAL #2   Title Pt. will demonstrate ambulation c boot s crutches community  distances.    Status Achieved             PT Long Term Goals - 01/16/21 1317      PT LONG TERM GOAL #1   Title Patient will demonstrate/report pain at worst less than or equal to 2/10 to facilitate minimal limitation in daily activity secondary to pain symptoms.    Time 6    Period Weeks    Status Revised    Target Date 02/27/21      PT LONG TERM GOAL #2   Title Patient will demonstrate independent use of home exercise program to facilitate ability to maintain/progress functional gains from skilled physical therapy services.    Time 6    Period Weeks    Status Revised    Target Date 02/27/21      PT LONG TERM GOAL #3   Title Patient will demonstrate independent ambulation community distances > 500 ft to facilitate community integration at [facility].    Period Weeks    Status Achieved      PT LONG TERM GOAL #4   Title Pt. will demonstrate Lt ankle AROM within 85% of Lt to facilitate ability to ambulation, navigate stairs at PLOF.    Time 6    Period Weeks    Status Revised    Target Date 02/27/21      PT LONG TERM GOAL #5   Title Pt. will demonstrate ability to lift/carry 100 lbs, ascending descend 8-10 inch stairs for work related activity at PLOF.    Time 6    Period Weeks    Status Revised    Target Date 02/27/21      PT LONG TERM GOAL #6   Title Pt. will demonstrate return to work at [employer].    Time 6    Period Weeks    Status Revised    Target Date 02/27/21                 Plan - 01/27/21 1619    Clinical Impression Statement 1 of 8 approved visits performed today.  Similar subjective presentation today to previous visit per reports.  DF continued to show improvement in passive mobility/active mobility.  WB strengthening in PF and  single leg activity exacerbated symptoms anterior ankle mortise.    Examination-Activity Limitations Sit;Squat;Bend;Stairs;Stand;Transfers;Lift;Locomotion Level    Examination-Participation Restrictions Community  Activity;Occupation;Shop;Driving;Yard Work    Stability/Clinical Decision Making Stable/Uncomplicated    Rehab Potential Good    PT Frequency 2x / week    PT Duration 6 weeks    PT Treatment/Interventions ADLs/Self Care Home Management;Cryotherapy;Electrical Stimulation;Iontophoresis 4mg /ml Dexamethasone;Moist Heat;Balance training;Therapeutic exercise;Manual techniques;Therapeutic activities;Functional mobility training;Stair training;Gait training;Ultrasound;Neuromuscular re-education;Patient/family education;DME Instruction;Passive range of motion;Dry needling;Spinal Manipulations;Joint Manipulations;Taping;Vasopneumatic Device    PT Next Visit Plan Strength, balance and dorsiflexion AROM focus.  WB loading for work activity    PT Home Exercise Plan [email or web address]    Consulted and Agree with Plan of Care Patient           Patient will benefit from skilled therapeutic intervention in order to improve the following deficits and impairments:  Abnormal gait,Decreased endurance,Hypomobility,Increased edema,Decreased activity tolerance,Decreased strength,Pain,Difficulty walking,Decreased mobility,Decreased balance,Improper body mechanics,Impaired perceived functional ability,Impaired flexibility,Decreased coordination,Decreased range of motion  Visit Diagnosis: Pain in left ankle and joints of left foot  Muscle weakness (generalized)  Stiffness of left ankle, not elsewhere classified  Difficulty walking  Localized edema     Problem List Patient Active Problem List   Diagnosis Date Noted  . S/P ORIF (open reduction internal fixation) fracture 11/05/2020  . Left ankle pain 11/05/2020  . Closed fracture of left ankle   . Periumbilical abdominal pain 12/17/2018  . Headache syndrome 09/28/2017  . Common migraine 09/28/2017  . Primary localized osteoarthritis of left knee 09/19/2017    11/19/2017, PT, DPT, OCS, ATC 01/27/21  4:36 PM    [facility] Physical Therapy [address] [place], [place], [place] Phone: [phone]   Fax:  [phone]  Name: Thomas Burgess MRN: Katha Hamming Date of Birth: 01/04/1976

## 2021-01-28 ENCOUNTER — Encounter: Payer: BC Managed Care – PPO | Admitting: Rehabilitative and Restorative Service Providers"

## 2021-02-03 ENCOUNTER — Other Ambulatory Visit: Payer: Self-pay

## 2021-02-03 ENCOUNTER — Ambulatory Visit (INDEPENDENT_AMBULATORY_CARE_PROVIDER_SITE_OTHER): Payer: Worker's Compensation | Admitting: Rehabilitative and Restorative Service Providers"

## 2021-02-03 ENCOUNTER — Encounter: Payer: Self-pay | Admitting: Rehabilitative and Restorative Service Providers"

## 2021-02-03 DIAGNOSIS — R600 Localized edema: Secondary | ICD-10-CM

## 2021-02-03 DIAGNOSIS — M6281 Muscle weakness (generalized): Secondary | ICD-10-CM

## 2021-02-03 DIAGNOSIS — M25672 Stiffness of left ankle, not elsewhere classified: Secondary | ICD-10-CM | POA: Diagnosis not present

## 2021-02-03 DIAGNOSIS — M25572 Pain in left ankle and joints of left foot: Secondary | ICD-10-CM | POA: Diagnosis not present

## 2021-02-03 DIAGNOSIS — R262 Difficulty in walking, not elsewhere classified: Secondary | ICD-10-CM | POA: Diagnosis not present

## 2021-02-03 NOTE — Therapy (Signed)
[facility] Physical Therapy [address] [place], [place], [address] Phone: [phone]   Fax:  [phone]  Physical Therapy Treatment  Patient Details  Name: Thomas Burgess MRN: [id number] Date of Birth: 07-25-1976 Referring Provider (PT): West Bali Persons, PA-C   Encounter Date: 02/03/2021   PT End of Session - 02/03/21 1519    Visit Number 13    Number of Visits 19    Date for PT Re-Evaluation 02/27/21    Authorization Type Workers comp 8 visits until 02/16/2021    Authorization - Visit Number 2    Authorization - Number of Visits 8    PT Start Time 1510    PT Stop Time 1600    PT Time Calculation (min) 50 min    Activity Tolerance Patient tolerated treatment well    Behavior During Therapy [facility] for tasks assessed/performed           Past Medical History:  Diagnosis Date  . Common migraine 09/28/2017  . Dyspnea    when walking with crutches  . Family history of adverse reaction to anesthesia    mother/father- nausea  . Gout   . Headache syndrome 09/28/2017   Right occipital  . History of kidney stones    passed  . Hypothyroid   . Migraine    Control Emgatitin    Past Surgical History:  Procedure Laterality Date  . FEMUR SURGERY Left    repair with repair  . FEMUR SURGERY Left    hardware removal  . LAPAROSCOPIC APPENDECTOMY N/A 12/18/2018   Procedure: APPENDECTOMY LAPAROSCOPIC;  Surgeon: Almond Lint, MD;  Location: [facility];  Service: General;  Laterality: N/A;  . 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 LEG SURGERY Left    Removed hardware  . ORIF ANKLE FRACTURE Left 11/05/2020   Procedure: OPEN REDUCTION INTERNAL FIXATION (ORIF) LEFT ANKLE FRACTURE;  Surgeon: Nadara Mustard, MD;  Location: [facility] OR;  Service: Orthopedics;  Laterality: Left;  . SHOULDER SURGERY Left    Frozen shoulder    There were no vitals filed for this visit.   Subjective Assessment - 02/03/21 1517    Subjective Pt. indicated walking extra and  standing more yesterday created some complaints today, reporting 6/10 or so up arrival, mainly on anterior foot/ankle.    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 Yes    Pain Score 6     Pain Location Ankle    Pain Orientation Left    Pain Descriptors / Indicators Tightness;Sore;Aching;Throbbing    Pain Type Surgical pain    Pain Onset More than a month ago    Pain Frequency Constant    Aggravating Factors  standing /walking prolonged                             [facility] Adult PT Treatment/Exercise - 02/03/21 0001      Neuro Re-ed    Neuro Re-ed Details  SLS on foam occaisonal HHA 30 sec x 5 bilateral, fitter rocker board fwd/back 30 x DL      Electrical Stimulation   Electrical Stimulation Location Lt ankle    Electrical Stimulation Action Pre mod    Electrical Stimulation Parameters to tolerance    Electrical Stimulation Goals  Pain;Edema      Vasopneumatic   Number Minutes Vasopneumatic  10 minutes    Vasopnuematic Location  Ankle    Vasopneumatic Pressure High    Vasopneumatic Temperature  34      Manual Therapy   Manual therapy comments g2-g3 ap talocrural mobs, medial/lateral g2-g3 mobs subtalar jt      Ankle Exercises: Aerobic   Other Aerobic LE lvl 6.0 10 mins UBE      Ankle Exercises: Stretches   Gastroc Stretch 30 seconds;5 reps   runner stretch incline board                   PT Short Term Goals - 01/02/21 1600      PT SHORT TERM GOAL #1   Title Patient will demonstrate independent use of home exercise program to maintain progress from in clinic treatments.    Time 3    Period Weeks    Status Achieved    Target Date 12/22/20      PT SHORT TERM GOAL #2   Title Pt. will demonstrate ambulation c boot s crutches community distances.     Status Achieved             PT Long Term Goals - 01/16/21 1317      PT LONG TERM GOAL #1   Title Patient will demonstrate/report pain at worst less than or equal to 2/10 to facilitate minimal limitation in daily activity secondary to pain symptoms.    Time 6    Period Weeks    Status Revised    Target Date 02/27/21      PT LONG TERM GOAL #2   Title Patient will demonstrate independent use of home exercise program to facilitate ability to maintain/progress functional gains from skilled physical therapy services.    Time 6    Period Weeks    Status Revised    Target Date 02/27/21      PT LONG TERM GOAL #3   Title Patient will demonstrate independent ambulation community distances > 500 ft to facilitate community integration at [facility].    Period Weeks    Status Achieved      PT LONG TERM GOAL #4   Title Pt. will demonstrate Lt ankle AROM within 85% of Lt to facilitate ability to ambulation, navigate stairs at PLOF.    Time 6    Period Weeks    Status Revised    Target Date 02/27/21      PT LONG TERM GOAL #5   Title Pt. will demonstrate ability to lift/carry 100 lbs, ascending descend 8-10 inch stairs for work related activity at PLOF.    Time 6    Period Weeks    Status Revised    Target Date 02/27/21      PT LONG TERM GOAL #6   Title Pt. will demonstrate return to work at [employer].    Time 6    Period Weeks    Status Revised    Target Date 02/27/21                 Plan - 02/03/21 1544    Clinical Impression Statement Pt. self reported increased irritability of pain in last day following increased WB activity.  Continued edema noted in Lower Lt LE at this time.  Pt. has maintained improvements in active movement despite pain/edema.  Continued WB limitations, increased antalgic gait noted today.  Increased pain management manual and modalities intervention today according to symptoms.  Examination-Activity Limitations  Sit;Squat;Bend;Stairs;Stand;Transfers;Lift;Locomotion Level    Examination-Participation Restrictions Community Activity;Occupation;Shop;Driving;Yard Work    Stability/Clinical Decision Making Stable/Uncomplicated    Rehab Potential Good    PT Frequency 2x / week    PT Duration 6 weeks    PT Treatment/Interventions ADLs/Self Care Home Management;Cryotherapy;Electrical Stimulation;Iontophoresis 4mg /ml Dexamethasone;Moist Heat;Balance training;Therapeutic exercise;Manual techniques;Therapeutic activities;Functional mobility training;Stair training;Gait training;Ultrasound;Neuromuscular re-education;Patient/family education;DME Instruction;Passive range of motion;Dry needling;Spinal Manipulations;Joint Manipulations;Taping;Vasopneumatic Device    PT Next Visit Plan Strength, balance and dorsiflexion AROM focus.  WB loading for work activity.  Modalties PRN    PT Home Exercise Plan [email or web address]    Consulted and Agree with Plan of Care Patient           Patient will benefit from skilled therapeutic intervention in order to improve the following deficits and impairments:  Abnormal gait,Decreased endurance,Hypomobility,Increased edema,Decreased activity tolerance,Decreased strength,Pain,Difficulty walking,Decreased mobility,Decreased balance,Improper body mechanics,Impaired perceived functional ability,Impaired flexibility,Decreased coordination,Decreased range of motion  Visit Diagnosis: Pain in left ankle and joints of left foot  Muscle weakness (generalized)  Stiffness of left ankle, not elsewhere classified  Difficulty walking  Localized edema     Problem List Patient Active Problem List   Diagnosis Date Noted  . S/P ORIF (open reduction internal fixation) fracture 11/05/2020  . Left ankle pain 11/05/2020  . Closed fracture of left ankle   . Periumbilical abdominal pain 12/17/2018  . Headache syndrome 09/28/2017  . Common migraine 09/28/2017  . Primary localized osteoarthritis of  left knee 09/19/2017    11/19/2017, PT, DPT, OCS, ATC 02/03/21  3:51 PM    [facility] Physical Therapy [address] [place], [place], [place] Phone: [phone]   Fax:  [phone]  Name: Thomas Burgess MRN: Katha Hamming Date of Birth: [birth year]

## 2021-02-05 ENCOUNTER — Ambulatory Visit (INDEPENDENT_AMBULATORY_CARE_PROVIDER_SITE_OTHER): Payer: Worker's Compensation | Admitting: Rehabilitative and Restorative Service Providers"

## 2021-02-05 ENCOUNTER — Other Ambulatory Visit: Payer: Self-pay

## 2021-02-05 DIAGNOSIS — M25572 Pain in left ankle and joints of left foot: Secondary | ICD-10-CM

## 2021-02-05 DIAGNOSIS — R262 Difficulty in walking, not elsewhere classified: Secondary | ICD-10-CM | POA: Diagnosis not present

## 2021-02-05 DIAGNOSIS — M6281 Muscle weakness (generalized): Secondary | ICD-10-CM | POA: Diagnosis not present

## 2021-02-05 DIAGNOSIS — M25672 Stiffness of left ankle, not elsewhere classified: Secondary | ICD-10-CM | POA: Diagnosis not present

## 2021-02-05 DIAGNOSIS — R600 Localized edema: Secondary | ICD-10-CM

## 2021-02-05 NOTE — Therapy (Signed)
[facility] Physical Therapy [address] [place], [place], [address] Phone: [phone]   Fax:  [phone]  Physical Therapy Treatment  Patient Details  Name: Thomas Burgess MRN: [id number] Date of Birth: July 01, 1976 Referring Provider (PT): West Bali Persons, PA-C   Encounter Date: 02/05/2021   PT End of Session - 02/05/21 1508    Visit Number 14    Number of Visits 19    Date for PT Re-Evaluation 02/27/21    Authorization Type Workers comp 8 visits until 02/16/2021    Authorization - Visit Number 3    Authorization - Number of Visits 8    PT Start Time 1500    PT Stop Time 1550    PT Time Calculation (min) 50 min    Activity Tolerance Patient limited by pain    Behavior During Therapy [facility] for tasks assessed/performed           Past Medical History:  Diagnosis Date  . Common migraine 09/28/2017  . Dyspnea    when walking with crutches  . Family history of adverse reaction to anesthesia    mother/father- nausea  . Gout   . Headache syndrome 09/28/2017   Right occipital  . History of kidney stones    passed  . Hypothyroid   . Migraine    Control Emgatitin    Past Surgical History:  Procedure Laterality Date  . FEMUR SURGERY Left    repair with repair  . FEMUR SURGERY Left    hardware removal  . LAPAROSCOPIC APPENDECTOMY N/A 12/18/2018   Procedure: APPENDECTOMY LAPAROSCOPIC;  Surgeon: Almond Lint, MD;  Location: [facility];  Service: General;  Laterality: N/A;  . 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 LEG SURGERY Left    Removed hardware  . ORIF ANKLE FRACTURE Left 11/05/2020   Procedure: OPEN REDUCTION INTERNAL FIXATION (ORIF) LEFT ANKLE FRACTURE;  Surgeon: Nadara Mustard, MD;  Location: [facility] OR;  Service: Orthopedics;  Laterality: Left;  . SHOULDER SURGERY Left    Frozen shoulder    There were no vitals filed for this visit.   Subjective Assessment - 02/05/21 1509    Subjective Pt. indicated similar complaints of pain today  in foot and ankle as his last visit.  Pt. stated insidious onset and constant mostly in that time.    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 Yes    Pain Score 7     Pain Location Ankle    Pain Orientation Left    Pain Descriptors / Indicators Tightness;Sore;Throbbing;Constant    Pain Onset More than a month ago    Pain Frequency Constant    Aggravating Factors  constant insidious in last week.  WB is troublesome    Pain Relieving Factors nothing specific in last few days.    Effect of Pain on Daily Activities Unable to perform work tasks              [facility] PT Assessment - 02/05/21 0001      Figure 8 Edema   Figure 8 - Left  22.75 inch      AROM   Left Ankle Dorsiflexion 0   in full knee extension  [facility] Adult PT Treatment/Exercise - 02/05/21 0001      Electrical Stimulation   Electrical Stimulation Location Lt ankle    Electrical Stimulation Action IFC    Electrical Stimulation Parameters to tolerance    Electrical Stimulation Goals Pain;Edema      Vasopneumatic   Number Minutes Vasopneumatic  10 minutes    Vasopnuematic Location  Ankle    Vasopneumatic Pressure High    Vasopneumatic Temperature  34      Manual Therapy   Manual therapy comments g2-g3 ap talocrural mobs, medial/lateral g2-g3 mobs subtalar jt      Ankle Exercises: Supine   T-Band 4 way blue band in elevation 3 x 15 each way, a-z x 2 in elevation      Ankle Exercises: Aerobic   Recumbent Bike Lvl 5 10 mins                    PT Short Term Goals - 01/02/21 1600      PT SHORT TERM GOAL #1   Title Patient will demonstrate independent use of home exercise program to maintain progress from in clinic treatments.    Time 3    Period Weeks    Status  Achieved    Target Date 12/22/20      PT SHORT TERM GOAL #2   Title Pt. will demonstrate ambulation c boot s crutches community distances.    Status Achieved             PT Long Term Goals - 01/16/21 1317      PT LONG TERM GOAL #1   Title Patient will demonstrate/report pain at worst less than or equal to 2/10 to facilitate minimal limitation in daily activity secondary to pain symptoms.    Time 6    Period Weeks    Status Revised    Target Date 02/27/21      PT LONG TERM GOAL #2   Title Patient will demonstrate independent use of home exercise program to facilitate ability to maintain/progress functional gains from skilled physical therapy services.    Time 6    Period Weeks    Status Revised    Target Date 02/27/21      PT LONG TERM GOAL #3   Title Patient will demonstrate independent ambulation community distances > 500 ft to facilitate community integration at [facility].    Period Weeks    Status Achieved      PT LONG TERM GOAL #4   Title Pt. will demonstrate Lt ankle AROM within 85% of Lt to facilitate ability to ambulation, navigate stairs at PLOF.    Time 6    Period Weeks    Status Revised    Target Date 02/27/21      PT LONG TERM GOAL #5   Title Pt. will demonstrate ability to lift/carry 100 lbs, ascending descend 8-10 inch stairs for work related activity at PLOF.    Time 6    Period Weeks    Status Revised    Target Date 02/27/21      PT LONG TERM GOAL #6   Title Pt. will demonstrate return to work at [employer].    Time 6    Period Weeks    Status Revised    Target Date 02/27/21                 Plan - 02/05/21 1538    Clinical Impression Statement Continued presence of elevated severity and irritability of symptoms at  this time c continued visible swelling and pitting edema noted in Lt ankle/foot at this time.  Due to symptoms/edema, DF was 10 degrees less than previous visits with less complaints.  Intervention today in NWB and elevation to encourage  muscle pump edema evacuation and mobilty gains.  Performed today c fair to good tolerance.    Examination-Activity Limitations Sit;Squat;Bend;Stairs;Stand;Transfers;Lift;Locomotion Level    Examination-Participation Restrictions Community Activity;Occupation;Shop;Driving;Yard Work    Stability/Clinical Decision Making Stable/Uncomplicated    Rehab Potential Good    PT Frequency 2x / week    PT Duration 6 weeks    PT Treatment/Interventions ADLs/Self Care Home Management;Cryotherapy;Electrical Stimulation;Iontophoresis 4mg /ml Dexamethasone;Moist Heat;Balance training;Therapeutic exercise;Manual techniques;Therapeutic activities;Functional mobility training;Stair training;Gait training;Ultrasound;Neuromuscular re-education;Patient/family education;DME Instruction;Passive range of motion;Dry needling;Spinal Manipulations;Joint Manipulations;Taping;Vasopneumatic Device    PT Next Visit Plan Continue to implement DF gaining intervention in manual and ther ex, progress WB activity as tolerated c balance/strengthening.    PT Home Exercise Plan [email or web address]    Consulted and Agree with Plan of Care Patient           Patient will benefit from skilled therapeutic intervention in order to improve the following deficits and impairments:  Abnormal gait,Decreased endurance,Hypomobility,Increased edema,Decreased activity tolerance,Decreased strength,Pain,Difficulty walking,Decreased mobility,Decreased balance,Improper body mechanics,Impaired perceived functional ability,Impaired flexibility,Decreased coordination,Decreased range of motion  Visit Diagnosis: Pain in left ankle and joints of left foot  Muscle weakness (generalized)  Stiffness of left ankle, not elsewhere classified  Difficulty walking  Localized edema     Problem List Patient Active Problem List   Diagnosis Date Noted  . S/P ORIF (open reduction internal fixation) fracture 11/05/2020  . Left ankle pain 11/05/2020  . Closed fracture of  left ankle   . Periumbilical abdominal pain 12/17/2018  . Headache syndrome 09/28/2017  . Common migraine 09/28/2017  . Primary localized osteoarthritis of left knee 09/19/2017    11/19/2017, PT, DPT, OCS, ATC 02/05/21  3:41 PM    [facility] Physical Therapy [address] [place], [place], [place] Phone: [phone]   Fax:  [phone]  Name: QUENTEZ LOBER MRN: Katha Hamming Date of Birth: 27-Feb-1976

## 2021-02-10 ENCOUNTER — Encounter: Payer: BC Managed Care – PPO | Admitting: Rehabilitative and Restorative Service Providers"

## 2021-02-12 ENCOUNTER — Ambulatory Visit (INDEPENDENT_AMBULATORY_CARE_PROVIDER_SITE_OTHER): Payer: Worker's Compensation | Admitting: Rehabilitative and Restorative Service Providers"

## 2021-02-12 ENCOUNTER — Other Ambulatory Visit: Payer: Self-pay

## 2021-02-12 ENCOUNTER — Encounter: Payer: Self-pay | Admitting: Rehabilitative and Restorative Service Providers"

## 2021-02-12 DIAGNOSIS — M25572 Pain in left ankle and joints of left foot: Secondary | ICD-10-CM | POA: Diagnosis not present

## 2021-02-12 DIAGNOSIS — R262 Difficulty in walking, not elsewhere classified: Secondary | ICD-10-CM

## 2021-02-12 DIAGNOSIS — M25672 Stiffness of left ankle, not elsewhere classified: Secondary | ICD-10-CM | POA: Diagnosis not present

## 2021-02-12 DIAGNOSIS — M6281 Muscle weakness (generalized): Secondary | ICD-10-CM

## 2021-02-12 DIAGNOSIS — R600 Localized edema: Secondary | ICD-10-CM

## 2021-02-12 NOTE — Therapy (Addendum)
[facility] [address] [place], [place], [address] Phone: [phone]   Fax:  [phone]  Physical Therapy Treatment/Progress Note/Discharge  Patient Details  Name: Thomas Burgess MRN: [id number] Date of Birth: 03-04-1976 Referring Provider (PT): Bevely Palmer Persons, PA-C   Encounter Date: 02/12/2021   Progress Note Reporting Period 01/16/2021 to 02/12/2021  See note below for Objective Data and Assessment of Progress/Goals.        PT End of Session - 02/12/21 1504    Visit Number 15    Number of Visits 19    Date for PT Re-Evaluation 02/27/21    Authorization Type Workers comp 8 visits until 02/16/2021    Authorization - Visit Number 4    Authorization - Number of Visits 8    PT Start Time 7482    PT Stop Time 1540    PT Time Calculation (min) 51 min    Activity Tolerance Patient tolerated treatment well    Behavior During Therapy WFL for tasks assessed/performed           Past Medical History:  Diagnosis Date  . Common migraine 09/28/2017  . Dyspnea    when walking with crutches  . Family history of adverse reaction to anesthesia    mother/father- nausea  . Gout   . Headache syndrome 09/28/2017   Right occipital  . History of kidney stones    passed  . Hypothyroid   . Migraine    Control Emgatitin    Past Surgical History:  Procedure Laterality Date  . FEMUR SURGERY Left    repair with repair  . FEMUR SURGERY Left    hardware removal  . LAPAROSCOPIC APPENDECTOMY N/A 12/18/2018   Procedure: APPENDECTOMY LAPAROSCOPIC;  Surgeon: Stark Klein, MD;  Location: [facility];  Service: General;  Laterality: N/A;  . 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 LEG SURGERY Left    Removed hardware  . ORIF ANKLE FRACTURE Left 11/05/2020   Procedure: OPEN REDUCTION INTERNAL FIXATION (ORIF) LEFT ANKLE FRACTURE;  Surgeon: Newt Minion, MD;  Location: [place];  Service: Orthopedics;  Laterality: Left;  . SHOULDER SURGERY Left     Frozen shoulder    There were no vitals filed for this visit.   Subjective Assessment - 02/12/21 1452    Subjective Pt. indicated feeling some improvement in symptoms, rated 3-4/10 or so today.  Rated return to normal at around 75%, still having pain, limping and strength deficits.    Pertinent History Previous bilateral leg fracture, Lt shoulder with history of physical therapy    Limitations Walking;Standing;Lifting;Other (comment)    Patient Stated Goals Reduce pain, get back to work, R/C car driving    Pain Score 4     Pain Location Ankle    Pain Orientation Left    Pain Descriptors / Indicators Sore;Tightness;Throbbing;Constant    Pain Type Surgical pain    Pain Onset More than a month ago    Pain Frequency Constant    Aggravating Factors  insidious, sometimes worse c standing/walking.    Pain Relieving Factors unsure of specific helps other the rest    Effect of Pain on Daily Activities Unable to lift/carry like normal, stairs up/down limited, walking prolonged.              [facility] PT Assessment - 02/12/21 0001      Assessment   Medical Diagnosis Closed fracture c ORIF Lt ankle    Referring Provider (PT) Bevely Palmer Persons,  PA-C    Onset Date/Surgical Date 11/05/20    Hand Dominance Right      Observation/Other Assessments   Focus on Therapeutic Outcomes (FOTO)  update 52%      Single Leg Stance   Comments Lt SLS 25 seconds      AROM   Left Ankle Dorsiflexion 5    Left Ankle Plantar Flexion 45    Left Ankle Inversion 30    Left Ankle Eversion 30      Strength   Left Ankle Dorsiflexion 5/5    Left Ankle Plantar Flexion 2+/5    Left Ankle Inversion 5/5    Left Ankle Eversion 5/5      Ambulation/Gait   Gait Comments Independent ambulation s device, reduced toe off progression, stance phase on Lt LE compared to Rt, antalgic gait                         [facility] Adult PT Treatment/Exercise - 02/12/21 0001      Neuro Re-ed    Neuro Re-ed Details   SLS on floor 25 seconds Lt, airex foam SLS c occasional HHA 30 sec x 5 bilateral, lateral stepping 3 cones x 10 bilateral      Vasopneumatic   Number Minutes Vasopneumatic  10 minutes    Vasopnuematic Location  Ankle    Vasopneumatic Pressure High    Vasopneumatic Temperature  34      Ankle Exercises: Aerobic   Recumbent Bike Lvl 5 10 mins      Ankle Exercises: Machines for Strengthening   Cybex Leg Press SL heel lift Lt 62 lbs 3 x 10, 75 lbs leg press 3 x 10 SL bilateral      Ankle Exercises: Stretches   Gastroc Stretch 30 seconds;5 reps   incline board runner stretch   Other Stretch self DF mob c strap on incline board Lt LE 2 x 10                    PT Short Term Goals - 01/02/21 1600      PT SHORT TERM GOAL #1   Title Patient will demonstrate independent use of home exercise program to maintain progress from in clinic treatments.    Time 3    Period Weeks    Status Achieved    Target Date 12/22/20      PT SHORT TERM GOAL #2   Title Pt. will demonstrate ambulation c boot s crutches community distances.    Status Achieved             PT Long Term Goals - 02/12/21 1508      PT LONG TERM GOAL #1   Title Patient will demonstrate/report pain at worst less than or equal to 2/10 to facilitate minimal limitation in daily activity secondary to pain symptoms.    Time 6    Period Weeks    Status On-going      PT LONG TERM GOAL #2   Title Patient will demonstrate independent use of home exercise program to facilitate ability to maintain/progress functional gains from skilled physical therapy services.    Time 6    Period Weeks    Status Achieved      PT LONG TERM GOAL #3   Title Patient will demonstrate independent ambulation community distances > 500 ft to facilitate community integration at [facility].    Period Weeks    Status Achieved  PT LONG TERM GOAL #4   Title Pt. will demonstrate Lt ankle AROM within 85% of Lt to facilitate ability to ambulation,  navigate stairs at PLOF.    Time 6    Period Weeks    Status On-going      PT LONG TERM GOAL #5   Title Pt. will demonstrate ability to lift/carry 100 lbs, ascending descend 8-10 inch stairs for work related activity at PLOF.    Time 6    Period Weeks    Status On-going      PT LONG TERM GOAL #6   Title Pt. will demonstrate return to work at [employer].    Time 6    Period Weeks    Status On-going                 Plan - 02/12/21 1506    Clinical Impression Statement Pt. has attended 4 of 8 additional approved visits but will not be able to complete other 4 prior to expiration date of approval (02/16/2021).  Pt. has reported continued mild to moderate symptoms at times, reported at 75% return to normal.  See objective data for updated information.  Pt. continued to present c impairments and liimtations that would prevent full return to PLOF at home and work activity s increased difficulty/symptoms as supported by objective reporting, FOTO scoring and subjective data.  Pt. to return to [place] next week.  Additional treatment would require extension of care by workers' compensation.    Examination-Activity Limitations Sit;Squat;Bend;Stairs;Stand;Transfers;Lift;Locomotion Level    Examination-Participation Restrictions Community Activity;Occupation;Shop;Driving;Yard Work    Stability/Clinical Decision Making Stable/Uncomplicated    Rehab Potential Good    PT Frequency 2x / week    PT Duration 6 weeks    PT Treatment/Interventions ADLs/Self Care Home Management;Cryotherapy;Electrical Stimulation;Iontophoresis 63m/ml Dexamethasone;Moist Heat;Balance training;Therapeutic exercise;Manual techniques;Therapeutic activities;Functional mobility training;Stair training;Gait training;Ultrasound;Neuromuscular re-education;Patient/family education;DME Instruction;Passive range of motion;Dry needling;Spinal Manipulations;Joint Manipulations;Taping;Vasopneumatic Device    PT Next Visit Plan Returning to [place],  extension of care authorization would be required.    PT Home Exercise Plan [email or web address]    Consulted and Agree with Plan of Care Patient           Patient will benefit from skilled therapeutic intervention in order to improve the following deficits and impairments:  Abnormal gait,Decreased endurance,Hypomobility,Increased edema,Decreased activity tolerance,Decreased strength,Pain,Difficulty walking,Decreased mobility,Decreased balance,Improper body mechanics,Impaired perceived functional ability,Impaired flexibility,Decreased coordination,Decreased range of motion  Visit Diagnosis: Pain in left ankle and joints of left foot  Muscle weakness (generalized)  Stiffness of left ankle, not elsewhere classified  Difficulty walking  Localized edema  Difficulty in walking, not elsewhere classified     Problem List Patient Active Problem List   Diagnosis Date Noted  . S/P ORIF (open reduction internal fixation) fracture 11/05/2020  . Left ankle pain 11/05/2020  . Closed fracture of left ankle   . Periumbilical abdominal pain 12/17/2018  . Headache syndrome 09/28/2017  . Common migraine 09/28/2017  . Primary localized osteoarthritis of left knee 09/19/2017    MScot Jun PT, DPT, OCS, ATC 02/12/21  3:32 PM   PHYSICAL THERAPY DISCHARGE SUMMARY  Visits from Start of Care: 15  Current functional level related to goals / functional outcomes: See note   Remaining deficits: See note   Education / Equipment: HEP Plan: Patient agrees to discharge.  Patient goals were partially met. Patient is being discharged due to not returning since the last visit.  ?????    MScot Jun PT, DPT, OCS, ATC 03/12/21  11:19  [facility] [address] [place], [place], [address] Phone: [phone]   Fax:  [phone]  Name: Thomas Burgess MRN: [id number] Date of Birth: 1976/11/12

## 2021-02-17 ENCOUNTER — Ambulatory Visit (INDEPENDENT_AMBULATORY_CARE_PROVIDER_SITE_OTHER): Payer: Worker's Compensation | Admitting: Orthopedic Surgery

## 2021-02-17 ENCOUNTER — Ambulatory Visit (INDEPENDENT_AMBULATORY_CARE_PROVIDER_SITE_OTHER): Payer: Worker's Compensation

## 2021-02-17 ENCOUNTER — Encounter: Payer: Self-pay | Admitting: Orthopedic Surgery

## 2021-02-17 ENCOUNTER — Encounter: Payer: BC Managed Care – PPO | Admitting: Physical Therapy

## 2021-02-17 DIAGNOSIS — S82892D Other fracture of left lower leg, subsequent encounter for closed fracture with routine healing: Secondary | ICD-10-CM | POA: Diagnosis not present

## 2021-02-17 DIAGNOSIS — M25872 Other specified joint disorders, left ankle and foot: Secondary | ICD-10-CM

## 2021-02-17 MED ORDER — LIDOCAINE HCL 1 % IJ SOLN
2.0000 mL | INTRAMUSCULAR | Status: AC | PRN
  Administered 2021-02-17: 2 mL

## 2021-02-17 MED ORDER — METHYLPREDNISOLONE ACETATE 40 MG/ML IJ SUSP
40.0000 mg | INTRAMUSCULAR | Status: AC | PRN
  Administered 2021-02-17: 40 mg via INTRA_ARTICULAR

## 2021-02-17 NOTE — Progress Notes (Signed)
Office Visit Note   Patient: Thomas Burgess           Date of Birth: 09-17-1976           MRN: [id number] Visit Date: 02/17/2021              Requested by: Kaleen Mask, MD [address] [place],  [place] [address] PCP: Kaleen Mask, MD  Chief Complaint  Patient presents with  . Left Ankle - Follow-up      HPI: Patient is a 45 year old gentleman who is seen 3-1/2 months out from open reduction internal fixation Weber B left fibular fracture.  Patient complains of stiffness he has just completed physical therapy pain anteriorly over the ankle with walking.  Assessment & Plan: Visit Diagnoses:  1. Closed fracture of left ankle with routine healing, subsequent encounter   2. Impingement syndrome of left ankle     Plan: Patient will continue fascial strengthening continue Achilles stretching this was demonstrated.  Patient has reached his maximal medical improvement and review of the [facility] guidelines for permanent partial impairment is permanent partial impairment would be 10% of the left ankle for a fracture that went through the joint.  Patient is given a note that he may return to work without restrictions on March 7.   Follow-Up Instructions: Return in about 4 weeks (around 03/17/2021).   Ortho Exam  Patient is alert, oriented, no adenopathy, well-dressed, normal affect, normal respiratory effort. Examination patient does have swelling in the left foot and ankle there is no redness no cellulitis the incision is well-healed he has dorsiflexion to neutral and pain anteriorly over the ankle with maximal dorsiflexion.  Imaging: XR Ankle Complete Left  Result Date: 02/17/2021 Three-view radiographs of the left ankle shows a congruent mortise the fibula is out to length the fracture is healed there is no hardware failure.  No images are attached to the encounter.  Labs: No results found for: HGBA1C, ESRSEDRATE, CRP, LABURIC, REPTSTATUS,  GRAMSTAIN, CULT, LABORGA   Lab Results  Component Value Date   ALBUMIN 4.7 12/26/2018   ALBUMIN 4.4 12/16/2018    No results found for: MG No results found for: VD25OH  No results found for: PREALBUMIN CBC EXTENDED Latest Ref Rng & Units 11/05/2020 11/05/2020 12/26/2018  WBC 4.0 - 10.5 K/uL 6.5 - 8.0  RBC 4.22 - 5.81 MIL/uL 4.75 - 4.71  HGB 13.0 - 17.0 g/dL 26.3 78.5 88.5  HCT 02.7 - 52.0 % 44.5 40.0 42.8  PLT 150 - 400 K/uL 214 - 262     There is no height or weight on file to calculate BMI.  Orders:  Orders Placed This Encounter  Procedures  . XR Ankle Complete Left   No orders of the defined types were placed in this encounter.    Procedures: Medium Joint Inj: L ankle on 02/17/2021 2:19 PM Indications: pain and diagnostic evaluation Details: 22 G 1.5 in needle Medications: 2 mL lidocaine 1 %; 40 mg methylPREDNISolone acetate 40 MG/ML Outcome: tolerated well, no immediate complications Procedure, treatment alternatives, risks and benefits explained, specific risks discussed. Consent was given by the patient. Immediately prior to procedure a time out was called to verify the correct patient, procedure, equipment, support staff and site/side marked as required. Patient was prepped and draped in the usual sterile fashion.      Clinical Data: No additional findings.  ROS:  All other systems negative, except as noted in the HPI. Review of Systems  Objective: Vital Signs: There were no vitals taken for this visit.  Specialty Comments:  No specialty comments available.  PMFS History: Patient Active Problem List   Diagnosis Date Noted  . S/P ORIF (open reduction internal fixation) fracture 11/05/2020  . Left ankle pain 11/05/2020  . Closed fracture of left ankle   . Periumbilical abdominal pain 12/17/2018  . Headache syndrome 09/28/2017  . Common migraine 09/28/2017  . Primary localized osteoarthritis of left knee 09/19/2017   Past Medical History:   Diagnosis Date  . Common migraine 09/28/2017  . Dyspnea    when walking with crutches  . Family history of adverse reaction to anesthesia    mother/father- nausea  . Gout   . Headache syndrome 09/28/2017   Right occipital  . History of kidney stones    passed  . Hypothyroid   . Migraine    Control Emgatitin    Family History  Problem Relation Age of Onset  . Congestive Heart Failure Mother   . Diabetes Mother   . Kidney failure Father   . Brain cancer Cousin     Past Surgical History:  Procedure Laterality Date  . FEMUR SURGERY Left    repair with repair  . FEMUR SURGERY Left    hardware removal  . LAPAROSCOPIC APPENDECTOMY N/A 12/18/2018   Procedure: APPENDECTOMY LAPAROSCOPIC;  Surgeon: Almond Lint, MD;  Location: [facility];  Service: General;  Laterality: N/A;  . 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 LEG SURGERY Left    Removed hardware  . ORIF ANKLE FRACTURE Left 11/05/2020   Procedure: OPEN REDUCTION INTERNAL FIXATION (ORIF) LEFT ANKLE FRACTURE;  Surgeon: Nadara Mustard, MD;  Location: [facility];  Service: Orthopedics;  Laterality: Left;  . SHOULDER SURGERY Left    Frozen shoulder   Social History   Occupational History  . Occupation: [employer]  Tobacco Use  . Smoking status: Never Smoker  . Smokeless tobacco: Never Used  Vaping Use  . Vaping Use: Never used  Substance and Sexual Activity  . Alcohol use: No  . Drug use: No  . Sexual activity: Not Currently    Birth control/protection: None

## 2021-03-16 ENCOUNTER — Ambulatory Visit (INDEPENDENT_AMBULATORY_CARE_PROVIDER_SITE_OTHER): Payer: Worker's Compensation | Admitting: Orthopedic Surgery

## 2021-03-16 ENCOUNTER — Encounter: Payer: Self-pay | Admitting: Orthopedic Surgery

## 2021-03-16 VITALS — Ht 73.0 in | Wt 235.0 lb

## 2021-03-16 DIAGNOSIS — S82892D Other fracture of left lower leg, subsequent encounter for closed fracture with routine healing: Secondary | ICD-10-CM | POA: Diagnosis not present

## 2021-03-16 NOTE — Progress Notes (Signed)
Office Visit Note   Patient: Thomas Burgess           Date of Birth: [birth year]           MRN: [id number] Visit Date: 03/16/2021              Requested by: Kaleen Mask, MD [address] [place],  [place] [address] PCP: Kaleen Mask, MD  Chief Complaint  Patient presents with  . Left Ankle - Follow-up    Left ankle ORIF 11/05/2020      HPI: Patient is a 45 year old gentleman who presents he is about 4-1/2 months status post open reduction internal fixation for left ankle fracture he states his ankle still is stiff and sore has a little bit of a limp pain anteriorly over the ankle.  He states he is back at full work.  Assessment & Plan: Visit Diagnoses:  1. Closed fracture of left ankle with routine healing, subsequent encounter     Plan: Continue with his rehab exercises follow-up as needed.  Anticipate that these stiffness and start of pain should resolve with time.  Follow-Up Instructions: No follow-ups on file.   Ortho Exam  Patient is alert, oriented, no adenopathy, well-dressed, normal affect, normal respiratory effort. Examination patient has good subtalar motion he has dorsiflexion about 10 degrees past neutral which is improved he is tender palpation anteriorly over the ankle joint with some impingement symptoms.  He has no pain from the retained hardware.  Imaging: No results found. No images are attached to the encounter.  Labs: No results found for: HGBA1C, ESRSEDRATE, CRP, LABURIC, REPTSTATUS, GRAMSTAIN, CULT, LABORGA   Lab Results  Component Value Date   ALBUMIN 4.7 12/26/2018   ALBUMIN 4.4 12/16/2018    No results found for: MG No results found for: VD25OH  No results found for: PREALBUMIN CBC EXTENDED Latest Ref Rng & Units 11/05/2020 11/05/2020 12/26/2018  WBC 4.0 - 10.5 K/uL 6.5 - 8.0  RBC 4.22 - 5.81 MIL/uL 4.75 - 4.71  HGB 13.0 - 17.0 g/dL 48.2 50.0 37.0  HCT 48.8 - 52.0 % 44.5 40.0 42.8  PLT 150 - 400 K/uL 214 - 262      Body mass index is 31 kg/m.  Orders:  No orders of the defined types were placed in this encounter.  No orders of the defined types were placed in this encounter.    Procedures: No procedures performed  Clinical Data: No additional findings.  ROS:  All other systems negative, except as noted in the HPI. Review of Systems  Objective: Vital Signs: Ht 6\' 1"  (1.854 m)   Wt 235 lb (106.6 kg)   BMI 31.00 kg/m   Specialty Comments:  No specialty comments available.  PMFS History: Patient Active Problem List   Diagnosis Date Noted  . S/P ORIF (open reduction internal fixation) fracture 11/05/2020  . Left ankle pain 11/05/2020  . Closed fracture of left ankle   . Periumbilical abdominal pain 12/17/2018  . Headache syndrome 09/28/2017  . Common migraine 09/28/2017  . Primary localized osteoarthritis of left knee 09/19/2017   Past Medical History:  Diagnosis Date  . Common migraine 09/28/2017  . Dyspnea    when walking with crutches  . Family history of adverse reaction to anesthesia    mother/father- nausea  . Gout   . Headache syndrome 09/28/2017   Right occipital  . History of kidney stones    passed  . Hypothyroid   . Migraine  Control Emgatitin    Family History  Problem Relation Age of Onset  . Congestive Heart Failure Mother   . Diabetes Mother   . Kidney failure Father   . Brain cancer Cousin     Past Surgical History:  Procedure Laterality Date  . FEMUR SURGERY Left    repair with repair  . FEMUR SURGERY Left    hardware removal  . LAPAROSCOPIC APPENDECTOMY N/A 12/18/2018   Procedure: APPENDECTOMY LAPAROSCOPIC;  Surgeon: Almond Lint, MD;  Location: [facility];  Service: General;  Laterality: N/A;  . 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 LEG SURGERY Left    Removed hardware  . ORIF ANKLE FRACTURE Left 11/05/2020   Procedure: OPEN REDUCTION INTERNAL FIXATION (ORIF) LEFT ANKLE FRACTURE;  Surgeon: Nadara Mustard, MD;  Location:  [facility];  Service: Orthopedics;  Laterality: Left;  . SHOULDER SURGERY Left    Frozen shoulder   Social History   Occupational History  . Occupation: [employer]  Tobacco Use  . Smoking status: Never Smoker  . Smokeless tobacco: Never Used  Vaping Use  . Vaping Use: Never used  Substance and Sexual Activity  . Alcohol use: No  . Drug use: No  . Sexual activity: Not Currently    Birth control/protection: None

## 2021-03-17 ENCOUNTER — Ambulatory Visit: Payer: BC Managed Care – PPO | Admitting: Orthopedic Surgery

## 2021-05-22 ENCOUNTER — Other Ambulatory Visit: Payer: Self-pay | Admitting: Physician Assistant

## 2021-05-25 ENCOUNTER — Ambulatory Visit (INDEPENDENT_AMBULATORY_CARE_PROVIDER_SITE_OTHER): Payer: BC Managed Care – PPO | Admitting: Neurology

## 2021-05-25 ENCOUNTER — Encounter: Payer: Self-pay | Admitting: Neurology

## 2021-05-25 VITALS — BP 126/87 | HR 92 | Ht 73.0 in | Wt 245.0 lb

## 2021-05-25 DIAGNOSIS — G43009 Migraine without aura, not intractable, without status migrainosus: Secondary | ICD-10-CM

## 2021-05-25 DIAGNOSIS — G4489 Other headache syndrome: Secondary | ICD-10-CM

## 2021-05-25 MED ORDER — BACLOFEN 5 MG PO TABS: 5.0000 mg | ORAL_TABLET | ORAL | 1 refills | Status: DC | PRN

## 2021-05-25 NOTE — Progress Notes (Signed)
PATIENT: Thomas Burgess DOB: 1976/02/28  REASON FOR VISIT: follow up HISTORY FROM: patient  HISTORY OF PRESENT ILLNESS: Today 05/25/21 Thomas Burgess is a 45 year old male with history of headaches and occipital neuralgia. Did quite well on Emgality, went to fill beginning of year, his portion was several hundred dollars.  According to the chart, back in January, insurance did not approve it through Jan 2023, he doesn't think he tried to fill it since then.  With [employer], he reports 90% improvement in headaches.  Are always located in the right occipital area, radiating forward associated with nausea and photophobia.  Since January, reports 3-4 significant headaches, requiring him to miss work.  Prior to [employer], he was missing 2-3 days a month.  For acute, will take baclofen with good benefit.  He did not get Nurtec.  Here today for evaluation unaccompanied.  Update 11/20/2020 SS: Thomas Burgess is a 45 year old male with history of headaches and possible occipital neuralgia. Headaches are always on the right occipital area, radiate forward, associated with nausea and photophobia.  Has been on Emgality since July 2021, has had 90% improvement in headaches.  Since then, only 2-3 headaches.  Rarely misses a day of work, before was missing 2 to 3 days a month.  Headaches occur in the evening, he will take Maxalt or baclofen with benefit, but has drowsiness.  He has to be up for work at 3 AM, this is usually why he misses work, but things are much improved.  Unfortunately, he broke his left ankle, required surgery a few weeks ago.  Is recovering on crutches.  He has been able to discontinue Topamax completely. Presents today for evaluation unaccompanied.  HISTORY 06/02/2020 SS: Thomas Burgess 45 year old male with history of headaches and possible occipital neuralgia.  He remains on Topamax, baclofen, and Maxalt.  His headaches are always in the right occipital area, radiate forward, associated with nausea, and  photophobia.  He does have history of underlying migraines, unclear if these are migraines or occipital neuralgia.  On average, 2 significant headaches a month, 2-4 mild headaches.  For headache, will initially take baclofen, usually will take away, if needed will resort to Maxalt, has excellent benefit with this.  On average, he misses 2 days of work a week.  Headaches always occur in the evening.  He has to go to work early in the morning, the headache will resolve around lunchtime.  He tried an increase in Topamax 125 mg, did not see much change, noted more side effect with memory and drowsiness.  He presents today for evaluation unaccompanied.  REVIEW OF SYSTEMS: Out of a complete 14 system review of symptoms, the patient complains only of the following symptoms, and all other reviewed systems are negative.  Headache  ALLERGIES: No Known Allergies  HOME MEDICATIONS: Outpatient Medications Prior to Visit  Medication Sig Dispense Refill  . allopurinol (ZYLOPRIM) 100 MG tablet Take 100 mg by mouth at bedtime.     . colchicine 0.6 MG tablet Take 0.6 mg by mouth 2 (two) times daily as needed (gout).    Marland Kitchen FLUoxetine (PROZAC) 20 MG tablet Take 40 mg by mouth daily.     Marland Kitchen gabapentin (NEURONTIN) 300 MG capsule Take 1 capsule (300 mg total) by mouth 3 (three) times daily. 3 times a day when necessary neuropathy pain 90 capsule 3  . gabapentin (NEURONTIN) 300 MG capsule TAKE 1 CAPSULE(300 MG) BY MOUTH THREE TIMES DAILY AS NEEDED FOR NERVE PAIN 90 capsule 3  .  Galcanezumab-gnlm (EMGALITY) 120 MG/ML SOAJ Inject 120 mg into the skin every 30 (thirty) days. 1 mL 11  . levothyroxine (SYNTHROID, LEVOTHROID) 112 MCG tablet Take 112 mcg by mouth See admin instructions. Takes 112 mcg every day, except Wednesday take 224 mcg.    . naproxen (NAPROSYN) 500 MG tablet Take 500 mg by mouth 2 (two) times daily as needed for mild pain.    . Rimegepant Sulfate (NURTEC) 75 MG TBDP Take one tablet daily onset of migraine.  Samples 4 tabs. NDC [address]-8 LOT [id number] exp 05-2023 (2 2 tablet packages). 4 tablet 0  . Rimegepant Sulfate (NURTEC) 75 MG TBDP Take 75 mg by mouth as needed (Take 1 tablet at onset of headache, max is 1 tablet in 24 hours). 8 tablet 5  . Baclofen 5 MG TABS Take 5 mg by mouth as needed. Take 1 tablet at onset of headache pain, may repeat dose in 24 hours if needed 30 tablet 1  . oxycodone (OXY-IR) 5 MG capsule Take 1 capsule (5 mg total) by mouth every 8 (eight) hours as needed. 30 capsule 0   No facility-administered medications prior to visit.    PAST MEDICAL HISTORY: Past Medical History:  Diagnosis Date  . Common migraine 09/28/2017  . Dyspnea    when walking with crutches  . Family history of adverse reaction to anesthesia    mother/father- nausea  . Gout   . Headache syndrome 09/28/2017   Right occipital  . History of kidney stones    passed  . Hypothyroid   . Migraine    Control Emgatitin    PAST SURGICAL HISTORY: Past Surgical History:  Procedure Laterality Date  . FEMUR SURGERY Left    repair with repair  . FEMUR SURGERY Left    hardware removal  . LAPAROSCOPIC APPENDECTOMY N/A 12/18/2018   Procedure: APPENDECTOMY LAPAROSCOPIC;  Surgeon: Almond Lint, MD;  Location: [facility];  Service: General;  Laterality: N/A;  . 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 LEG SURGERY Left    Removed hardware  . ORIF ANKLE FRACTURE Left 11/05/2020   Procedure: OPEN REDUCTION INTERNAL FIXATION (ORIF) LEFT ANKLE FRACTURE;  Surgeon: Nadara Mustard, MD;  [facility];  Service: Orthopedics;  Laterality: Left;  . SHOULDER SURGERY Left    Frozen shoulder    FAMILY HISTORY: Family History  Problem Relation Age of Onset  . Congestive Heart Failure Mother   . Diabetes Mother   . Kidney failure Father   . Brain cancer Cousin     SOCIAL HISTORY: Social History   Socioeconomic History  . Marital status: Married    Spouse name: Not on file  . Number of children: 1   . Years of education: 25  . Highest education level: Not on file  Occupational History  . Occupation: [employer]  Tobacco Use  . Smoking status: Never Smoker  . Smokeless tobacco: Never Used  Vaping Use  . Vaping Use: Never used  Substance and Sexual Activity  . Alcohol use: No  . Drug use: No  . Sexual activity: Not Currently    Birth control/protection: None  Other Topics Concern  . Not on file  Social History Narrative   Lives with wife, daughter   Caffeine use: soft drinks daily      Right handed    Social Determinants of Health   Financial Resource Strain: Not on file  Food Insecurity: Not on file  Transportation Needs: Not on file  Physical Activity: Not on file  Stress: Not on file  Social Connections: Not on file  Intimate Partner Violence: Not on file   PHYSICAL EXAM  Vitals:   05/25/21 1010  BP: 126/87  Pulse: 92  Weight: 245 lb (111.1 kg)  Height: 6\' 1"  (1.854 m)   Body mass index is 32.32 kg/m.  Generalized: Well developed, in no acute distress  Neurological examination  Mentation: Alert oriented to time, place, history taking. Follows all commands speech and language fluent Cranial nerve II-XII: Pupils were equal round reactive to light. Extraocular movements were full, visual field were full on confrontational test. Facial sensation and strength were normal. Head turning and shoulder shrug  were normal and symmetric. Motor: Good strength all extremities, in left post op boot after ankle surgery.  Sensory: Sensory testing is intact to soft touch on all 4 extremities. No evidence of extinction is noted.  Coordination: Cerebellar testing reveals good finger-nose-finger bilaterally Gait and station: Gait is normal. Reflexes: Normal 2+  DIAGNOSTIC DATA (LABS, IMAGING, TESTING) - I reviewed patient records, labs, notes, testing and imaging myself where available.  Lab Results  Component Value Date   WBC 6.5 11/05/2020   HGB 14.5 11/05/2020   HCT  44.5 11/05/2020   MCV 93.7 11/05/2020   PLT 214 11/05/2020      Component Value Date/Time   NA 139 11/05/2020 1204   K 4.7 11/05/2020 1204   CL 100 11/05/2020 1204   CO2 23 12/26/2018 1240   GLUCOSE 95 11/05/2020 1204   BUN 20 11/05/2020 1204   CREATININE 1.40 (H) 11/05/2020 1204   CALCIUM 9.7 12/26/2018 1240   PROT 8.1 12/26/2018 1240   ALBUMIN 4.7 12/26/2018 1240   AST 34 12/26/2018 1240   ALT 94 (H) 12/26/2018 1240   ALKPHOS 138 (H) 12/26/2018 1240   BILITOT 0.6 12/26/2018 1240   GFRNONAA >60 12/26/2018 1240   GFRAA >60 12/26/2018 1240   No results found for: CHOL, HDL, LDLCALC, LDLDIRECT, TRIG, CHOLHDL No results found for: 02/24/2019 No results found for: VITAMINB12 No results found for: TSH  ASSESSMENT AND PLAN 45 y.o. year old male  has a past medical history of Common migraine (09/28/2017), Dyspnea, Family history of adverse reaction to anesthesia, Gout, Headache syndrome (09/28/2017), History of kidney stones, Hypothyroid, and Migraine. here with:  1.  Right occipital headache, likely migraine headache component  -Excellent benefit with Emgality, 90% improvement -Looks like insurance covered from Jan 2022-2023, he will go to the pharmacy, check on this, will restart -Try Nurtec for acute headache, also covered through Jan 2023, can continue baclofen as needed for acute headache -Has been able to come off Topamax -Encouraged to send MyChart message if he is having difficulty filling his medications, otherwise follow-up 1 year or sooner if needed   07-29-1977, AGNP-C, DNP 05/25/2021, 10:40 AM [facility] [address], [address] [place], [place] [phone]

## 2021-05-25 NOTE — Patient Instructions (Signed)
Restart emgality for headache prevention  For acute headache, can take baclofen and Nurtec for acute headache  See you back in 1 year

## 2021-05-25 NOTE — Progress Notes (Signed)
I have read the note, and I agree with the clinical assessment and plan.  Zain Lankford K [place]   

## 2021-10-08 ENCOUNTER — Ambulatory Visit (INDEPENDENT_AMBULATORY_CARE_PROVIDER_SITE_OTHER): Payer: BC Managed Care – PPO | Admitting: Orthopedic Surgery

## 2021-10-08 ENCOUNTER — Ambulatory Visit: Payer: Self-pay

## 2021-10-08 DIAGNOSIS — M25561 Pain in right knee: Secondary | ICD-10-CM | POA: Diagnosis not present

## 2021-10-08 DIAGNOSIS — M1A0610 Idiopathic chronic gout, right knee, without tophus (tophi): Secondary | ICD-10-CM | POA: Diagnosis not present

## 2021-10-09 ENCOUNTER — Telehealth: Payer: Self-pay | Admitting: Orthopedic Surgery

## 2021-10-09 LAB — URIC ACID: Uric Acid, Serum: 8.9 mg/dL — ABNORMAL HIGH (ref 4.0–8.0)

## 2021-10-09 NOTE — Telephone Encounter (Signed)
-----   Message from Javier Glazier sent at 10/09/2021  8:29 AM EDT -----  ----- Message ----- From: Nadara Mustard, MD Sent: 10/09/2021   6:59 AM EDT To: Erlene Quan Forrest, RMA  Patient's uric acid is 8.9.  Have him increase his allopurinol to take it twice a day.  We will need to follow-up in 1 month to recheck his uric acid. ----- Message ----- From: Interface, [facility] Lab Results In Sent: 10/09/2021   1:55 AM EDT To: Nadara Mustard, MD

## 2021-10-09 NOTE — Telephone Encounter (Signed)
Pt informed. He has an appt scheduled for one month out.

## 2021-10-11 ENCOUNTER — Encounter: Payer: Self-pay | Admitting: Orthopedic Surgery

## 2021-10-11 DIAGNOSIS — M1A0610 Idiopathic chronic gout, right knee, without tophus (tophi): Secondary | ICD-10-CM | POA: Diagnosis not present

## 2021-10-11 MED ORDER — METHYLPREDNISOLONE ACETATE 40 MG/ML IJ SUSP
40.0000 mg | INTRAMUSCULAR | Status: AC | PRN
  Administered 2021-10-11: 40 mg via INTRA_ARTICULAR

## 2021-10-11 MED ORDER — LIDOCAINE HCL (PF) 1 % IJ SOLN
5.0000 mL | INTRAMUSCULAR | Status: AC | PRN
  Administered 2021-10-11: 5 mL

## 2021-10-11 NOTE — Progress Notes (Signed)
Office Visit Note   Patient: Thomas Burgess           Date of Birth: 08-24-1976           MRN: [id number] Visit Date: 10/08/2021              Requested by: Kaleen Mask, MD [address] [place],  [place] [address] PCP: Kaleen Mask, MD  Chief Complaint  Patient presents with   Right Knee - Pain      HPI: Patient is a 45 year old gentleman who is status post left ankle fusion.  Patient states he has developed right knee pain over the past week he initially thought it was gout he is currently taking gout medicine without improvement.  He denies any specific injury he has been using ice Tylenol and Vicodin.  Patient complains of pain along the patella tendon.  Patient is status post tibial plateau fracture in 2006.  Assessment & Plan: Visit Diagnoses:  1. Acute pain of right knee   2. Idiopathic chronic gout of right knee without tophus     Plan: We will draw a uric acid and inject the right knee and follow-up in 4 weeks.  We will increased allopurinol to twice a day.  Follow-Up Instructions: Return in about 4 weeks (around 11/05/2021).   Ortho Exam  Patient is alert, oriented, no adenopathy, well-dressed, normal affect, normal respiratory effort. Examination  Patient is exquisitely tender to palpation with light touch over the patella tendon with a mild effusion.  There is no tenderness to palpation over the medial lateral joint line.  Clinically patient has a patella tendinitis.  Uric acid drawn today shows a value of 8.9 consistent with acute gout attack.  Imaging: No results found. No images are attached to the encounter.  Labs: Lab Results  Component Value Date   LABURIC 8.9 (H) 10/08/2021     Lab Results  Component Value Date   ALBUMIN 4.7 12/26/2018   ALBUMIN 4.4 12/16/2018    No results found for: MG No results found for: VD25OH  No results found for: PREALBUMIN CBC EXTENDED Latest Ref Rng & Units 11/05/2020 11/05/2020 12/26/2018   WBC 4.0 - 10.5 K/uL 6.5 - 8.0  RBC 4.22 - 5.81 MIL/uL 4.75 - 4.71  HGB 13.0 - 17.0 g/dL 56.3 89.3 73.4  HCT 28.7 - 52.0 % 44.5 40.0 42.8  PLT 150 - 400 K/uL 214 - 262     There is no height or weight on file to calculate BMI.  Orders:  Orders Placed This Encounter  Procedures   Large Joint Inj   XR KNEE 3 VIEW RIGHT   Uric acid   No orders of the defined types were placed in this encounter.    Procedures: Large Joint Inj: R knee on 10/11/2021 11:49 AM Indications: pain and diagnostic evaluation Details: 22 G 1.5 in needle, anteromedial approach  Arthrogram: No  Medications: 5 mL lidocaine (PF) 1 %; 40 mg methylPREDNISolone acetate 40 MG/ML Outcome: tolerated well, no immediate complications Procedure, treatment alternatives, risks and benefits explained, specific risks discussed. Consent was given by the patient. Immediately prior to procedure a time out was called to verify the correct patient, procedure, equipment, support staff and site/side marked as required. Patient was prepped and draped in the usual sterile fashion.     Clinical Data: No additional findings.  ROS:  All other systems negative, except as noted in the HPI. Review of Systems  Objective: Vital Signs: There were  no vitals taken for this visit.  Specialty Comments:  No specialty comments available.  PMFS History: Patient Active Problem List   Diagnosis Date Noted   S/P ORIF (open reduction internal fixation) fracture 11/05/2020   Left ankle pain 11/05/2020   Closed fracture of left ankle    Periumbilical abdominal pain 12/17/2018   Headache syndrome 09/28/2017   Common migraine 09/28/2017   Primary localized osteoarthritis of left knee 09/19/2017   Past Medical History:  Diagnosis Date   Common migraine 09/28/2017   Dyspnea    when walking with crutches   Family history of adverse reaction to anesthesia    mother/father- nausea   Gout    Headache syndrome 09/28/2017   Right  occipital   History of kidney stones    passed   Hypothyroid    Migraine    Control Emgatitin    Family History  Problem Relation Age of Onset   Congestive Heart Failure Mother    Diabetes Mother    Kidney failure Father    Brain cancer Cousin     Past Surgical History:  Procedure Laterality Date   FEMUR SURGERY Left    repair with repair   FEMUR SURGERY Left    hardware removal   LAPAROSCOPIC APPENDECTOMY N/A 12/18/2018   Procedure: APPENDECTOMY LAPAROSCOPIC;  Surgeon: Almond Lint, MD;  Location: [facility];  Service: General;  Laterality: N/A;   LEG SURGERY Bilateral 2006   Broke both legs   LEG SURGERY Left    Removed hardware   ORIF ANKLE FRACTURE Left 11/05/2020   Procedure: OPEN REDUCTION INTERNAL FIXATION (ORIF) LEFT ANKLE FRACTURE;  Surgeon: Nadara Mustard, MD;  Location: [facility] OR;  Service: Orthopedics;  Laterality: Left;   SHOULDER SURGERY Left    Frozen shoulder   Social History   Occupational History   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  Tobacco Use   Smoking status: Never   Smokeless tobacco: Never  Vaping Use   Vaping Use: Never used  Substance and Sexual Activity   Alcohol use: No   Drug use: No   Sexual activity: Not Currently    Birth control/protection: None

## 2021-11-09 ENCOUNTER — Ambulatory Visit: Payer: BC Managed Care – PPO | Admitting: Orthopedic Surgery

## 2021-11-23 ENCOUNTER — Ambulatory Visit
Admission: EM | Admit: 2021-11-23 | Discharge: 2021-11-23 | Disposition: A | Discharge status: Home / Self Care | Payer: BC Managed Care – PPO | Attending: Emergency Medicine | Admitting: Emergency Medicine

## 2021-11-23 ENCOUNTER — Other Ambulatory Visit: Payer: Self-pay

## 2021-11-23 DIAGNOSIS — R058 Other specified cough: Secondary | ICD-10-CM

## 2021-11-23 DIAGNOSIS — R0981 Nasal congestion: Secondary | ICD-10-CM

## 2021-11-23 DIAGNOSIS — M109 Gout, unspecified: Secondary | ICD-10-CM | POA: Diagnosis not present

## 2021-11-23 DIAGNOSIS — J00 Acute nasopharyngitis [common cold]: Secondary | ICD-10-CM | POA: Diagnosis not present

## 2021-11-23 DIAGNOSIS — R0982 Postnasal drip: Secondary | ICD-10-CM

## 2021-11-23 MED ORDER — ALLOPURINOL 100 MG PO TABS: 100.0000 mg | ORAL_TABLET | Freq: Every day | ORAL | 2 refills | Status: AC

## 2021-11-23 MED ORDER — COLCHICINE 0.6 MG PO TABS: ORAL_TABLET | ORAL | 2 refills | Status: AC

## 2021-11-23 MED ORDER — METHYLPREDNISOLONE 4 MG PO TBPK: ORAL_TABLET | ORAL | 0 refills | Status: DC

## 2021-11-23 MED ORDER — METHYLPREDNISOLONE SODIUM SUCC 125 MG IJ SOLR
125.0000 mg | Freq: Once | INTRAMUSCULAR | Status: AC
  Administered 2021-11-23: 125 mg via INTRAMUSCULAR

## 2021-11-23 MED ORDER — IPRATROPIUM BROMIDE 0.06 % NA SOLN: 2.0000 | Freq: Four times a day (QID) | NASAL | 12 refills | Status: DC

## 2021-11-23 NOTE — Discharge Instructions (Addendum)
For gout flare, you were provided with an injection of Solu-Medrol and a follow-up methylprednisolone Dosepak, the same steroid, and a tapering dose.  Please take 1 row of tablets daily with your breakfast starting tomorrow morning until complete.  I also provided you with renewals of your prescriptions for colchicine.  The correct way to take colchicine for gout flare is to take 2 tablets at once, wait 1 hour and then take the third tablet, not to exceed 3 tablets in a 1 hour period.    I have also provided you with a renewal of your prescription for allopurinol 100 mg by mouth at bedtime, please do not begin taking this medication until your gout flare has completely resolved, allopurinol has been shown to prolong gout flares if taken too soon before symptoms resolved.  You may find that the steroid injection and Medrol Dosepak significantly resolve your upper respiratory inflammation and rhinitis.  Admit that it does not completely resolve the runny nose and the postnasal drip causing cough.  Please also consider using Atrovent nasal spray, 2 sprays in each nostril up to 4 times daily as needed.  This medication significantly dries up mucous membranes very effectively.

## 2021-11-23 NOTE — ED Triage Notes (Signed)
Pt presents with c/o possible gout on the L big toe.  Pt states he has been sick since thanksgiving and states he has been unable to get rid of the cough.

## 2021-11-23 NOTE — ED Provider Notes (Signed)
[facility]    CSN: [id number] Arrival date & time: 11/23/21  9735    HISTORY   Chief Complaint  Patient presents with   Cough   Foot Pain   HPI Thomas Burgess is a 45 y.o. male. Pt states he has been sick since thanksgiving and states he has been unable to get rid of the cough.  Patient states cough usually worse at night, thinks is secondary to a lot of postnasal drip and congestion.  States the congestion remains clear.  Patient denies sinus pressure or sinus pain, fever, aches, chills, nausea, vomiting, diarrhea, sore throat, hoarseness of voice.  Patient reports a history of gout, states he thinks he may have a flareup in his left big toe.  Patient is previously been prescribed colchicine and Zyloprim.  Patient states he also usually receives a shot when he has a flareup, states is not sure what it is.  The history is provided by the patient.  Foot Pain  Past Medical History:  Diagnosis Date   Common migraine 09/28/2017   Dyspnea    when walking with crutches   Family history of adverse reaction to anesthesia    mother/father- nausea   Gout    Headache syndrome 09/28/2017   Right occipital   History of kidney stones    passed   Hypothyroid    Migraine    Control Emgatitin   Patient Active Problem List   Diagnosis Date Noted   S/P ORIF (open reduction internal fixation) fracture 11/05/2020   Left ankle pain 11/05/2020   Closed fracture of left ankle    Periumbilical abdominal pain 12/17/2018   Headache syndrome 09/28/2017   Common migraine 09/28/2017   Primary localized osteoarthritis of left knee 09/19/2017   Past Surgical History:  Procedure Laterality Date   FEMUR SURGERY Left    repair with repair   FEMUR SURGERY Left    hardware removal   LAPAROSCOPIC APPENDECTOMY N/A 12/18/2018   Procedure: APPENDECTOMY LAPAROSCOPIC;  Surgeon: Almond Lint, MD;  Location: [facility];  Service: General;  Laterality: N/A;   LEG SURGERY Bilateral 2006   Broke  both legs   LEG SURGERY Left    Removed hardware   ORIF ANKLE FRACTURE Left 11/05/2020   Procedure: OPEN REDUCTION INTERNAL FIXATION (ORIF) LEFT ANKLE FRACTURE;  Surgeon: Nadara Mustard, MD;  Location: [facility];  Service: Orthopedics;  Laterality: Left;   SHOULDER SURGERY Left    Frozen shoulder    Home Medications    Prior to Admission medications   Medication Sig Start Date End Date Taking? Authorizing Provider  ipratropium (ATROVENT) 0.06 % nasal spray Place 2 sprays into both nostrils 4 (four) times daily. As needed for nasal congestion, runny nose 11/23/21  Yes Theadora Rama Scales, PA-C  methylPREDNISolone (MEDROL DOSEPAK) 4 MG TBPK tablet Take 24 mg on day 1, 20 mg on day 2, 16 mg on day 3, 12 mg on day 4, 8 mg on day 5, 4 mg on day 6. 11/23/21  Yes Theadora Rama Scales, PA-C  allopurinol (ZYLOPRIM) 100 MG tablet Take 1 tablet (100 mg total) by mouth at bedtime. 11/23/21 02/21/22  Theadora Rama Scales, PA-C  Baclofen 5 MG TABS Take 5 mg by mouth as needed. Take 1 tablet at onset of headache pain, may repeat dose in 24 hours if needed 05/25/21   Glean Salvo, NP  colchicine 0.6 MG tablet Take 1.2 mg (two 0.6-mg tablets) orally at the first sign of a flare  followed by 0.6 mg (1 tablet) one hour later; MAX 1.8 mg over 1 hour 11/23/21   Theadora Rama Scales, PA-C  FLUoxetine (PROZAC) 20 MG tablet Take 40 mg by mouth daily.     [provider]  gabapentin (NEURONTIN) 300 MG capsule Take 1 capsule (300 mg total) by mouth 3 (three) times daily. 3 times a day when necessary neuropathy pain 01/13/21   Persons, [place], PA  gabapentin (NEURONTIN) 300 MG capsule TAKE 1 CAPSULE(300 MG) BY MOUTH THREE TIMES DAILY AS NEEDED FOR NERVE PAIN 05/22/21   Persons, [place], PA  Galcanezumab-gnlm (EMGALITY) 120 MG/ML SOAJ Inject 120 mg into the skin every 30 (thirty) days. 11/20/20   Glean Salvo, NP  levothyroxine (SYNTHROID, LEVOTHROID) 112 MCG tablet Take 112 mcg by mouth See admin instructions.  Takes 112 mcg every day, except Wednesday take 224 mcg.    [provider]  naproxen (NAPROSYN) 500 MG tablet Take 500 mg by mouth 2 (two) times daily as needed for mild pain.    [provider]  Rimegepant Sulfate (NURTEC) 75 MG TBDP Take one tablet daily onset of migraine. Samples 4 tabs. NDC [address]-9 LOT [id number] exp 05-2023 (2 2 tablet packages). 12/16/20   Glean Salvo, NP  Rimegepant Sulfate (NURTEC) 75 MG TBDP Take 75 mg by mouth as needed (Take 1 tablet at onset of headache, max is 1 tablet in 24 hours). 12/24/20   Glean Salvo, NP   Family History Family History  Problem Relation Age of Onset   Congestive Heart Failure Mother    Diabetes Mother    Kidney failure Father    Brain cancer Cousin    Social History Social History   Tobacco Use   Smoking status: Never   Smokeless tobacco: Never  Vaping Use   Vaping Use: Never used  Substance Use Topics   Alcohol use: No   Drug use: No   Allergies   Patient has no known allergies.  Review of Systems Review of Systems Pertinent findings noted in history of present illness.   Physical Exam Triage Vital Signs ED Triage Vitals  Enc Vitals Group     BP 10/16/21 0827 (!) 147/82     Pulse Rate 10/16/21 0827 72     Resp 10/16/21 0827 18     Temp 10/16/21 0827 98.3 F (36.8 C)     Temp Source 10/16/21 0827 Oral     SpO2 10/16/21 0827 98 %     Weight --      Height --      Head Circumference --      Peak Flow --      Pain Score 10/16/21 0826 5     Pain Loc --      Pain Edu? --      Excl. in GC? --   No data found.  Updated Vital Signs BP (!) 153/90 (BP Location: Right Arm)   Pulse 89   Temp 98.5 F (36.9 C) (Oral)   Resp 19   SpO2 96%   Physical Exam Vitals and nursing note reviewed.  Constitutional:      General: He is not in acute distress.    Appearance: Normal appearance. He is not ill-appearing.  HENT:     Head: Normocephalic and atraumatic.     Salivary Glands: Right salivary  gland is not diffusely enlarged or tender. Left salivary gland is not diffusely enlarged or tender.     Right Ear: Ear canal and  external ear normal. No drainage. A middle ear effusion is present. There is no impacted cerumen. Tympanic membrane is bulging. Tympanic membrane is not injected or erythematous.     Left Ear: Ear canal and external ear normal. No drainage. A middle ear effusion is present. There is no impacted cerumen. Tympanic membrane is bulging. Tympanic membrane is not injected or erythematous.     Ears:     Comments: Bilateral EACs normal, both TMs bulging with clear fluid    Nose: Rhinorrhea present. No nasal deformity, septal deviation, signs of injury, nasal tenderness, mucosal edema or congestion. Rhinorrhea is clear.     Right Nostril: Occlusion present. No foreign body, epistaxis or septal hematoma.     Left Nostril: Occlusion present. No foreign body, epistaxis or septal hematoma.     Right Turbinates: Enlarged, swollen and pale.     Left Turbinates: Enlarged, swollen and pale.     Right Sinus: No maxillary sinus tenderness or frontal sinus tenderness.     Left Sinus: No maxillary sinus tenderness or frontal sinus tenderness.     Mouth/Throat:     Lips: Pink. No lesions.     Mouth: Mucous membranes are moist. No oral lesions.     Pharynx: Oropharynx is clear. Uvula midline. No posterior oropharyngeal erythema or uvula swelling.     Tonsils: No tonsillar exudate. 0 on the right. 0 on the left.     Comments: Postnasal drip Eyes:     General: Lids are normal.        Right eye: No discharge.        Left eye: No discharge.     Extraocular Movements: Extraocular movements intact.     Conjunctiva/sclera: Conjunctivae normal.     Right eye: Right conjunctiva is not injected.     Left eye: Left conjunctiva is not injected.  Neck:     Trachea: Trachea and phonation normal.  Cardiovascular:     Rate and Rhythm: Normal rate and regular rhythm.     Pulses: Normal pulses.      Heart sounds: Normal heart sounds. No murmur heard.   No friction rub. No gallop.  Pulmonary:     Effort: Pulmonary effort is normal. No accessory muscle usage, prolonged expiration or respiratory distress.     Breath sounds: Normal breath sounds. No stridor, decreased air movement or transmitted upper airway sounds. No decreased breath sounds, wheezing, rhonchi or rales.  Chest:     Chest wall: No tenderness.  Musculoskeletal:        General: Normal range of motion.     Cervical back: Normal range of motion and neck supple. Normal range of motion.  Feet:     Right foot:     Skin integrity: Erythema and warmth (Base of right great toe with erythema, exquisite tenderness and swelling) present.  Lymphadenopathy:     Cervical: No cervical adenopathy.  Skin:    General: Skin is warm and dry.     Findings: No erythema or rash.  Neurological:     General: No focal deficit present.     Mental Status: He is alert and oriented to person, place, and time.  Psychiatric:        Mood and Affect: Mood normal.        Behavior: Behavior normal.    Visual Acuity Right Eye Distance:   Left Eye Distance:   Bilateral Distance:    Right Eye Near:   Left Eye Near:    Bilateral Near:  [facility] Couse / Diagnostics / Procedures:    EKG  Radiology No results found.  Procedures Procedures (including critical care time)  UC Diagnoses / Final Clinical Impressions(s)   I have reviewed the triage vital signs and the nursing notes.  Pertinent labs & imaging results that were available during my care of the patient were reviewed by me and considered in my medical decision making (see chart for details).   Final diagnoses:  Podagra  Acute rhinitis  Postnasal drip  Nasal congestion  Nonproductive cough   Patient provided with Solu-Medrol injection and Medrol Dosepak for gout flare, patient also request refills of colchicine.  Patient advised not to take allopurinol until symptoms have completely  resolved, refill provided.  Patient also provided with Atrovent nasal spray for rhinitis and decongestion.  Given duration of symptoms, do not believe that viral testing is indicated, based on physical exam findings patient would not benefit from antibiotic therapy.  Return precautions advised.  ED Prescriptions     Medication Sig Dispense Auth. Provider   allopurinol (ZYLOPRIM) 100 MG tablet Take 1 tablet (100 mg total) by mouth at bedtime. 30 tablet Theadora Rama Scales, PA-C   colchicine 0.6 MG tablet Take 1.2 mg (two 0.6-mg tablets) orally at the first sign of a flare followed by 0.6 mg (1 tablet) one hour later; MAX 1.8 mg over 1 hour 3 tablet Theadora Rama Scales, PA-C   methylPREDNISolone (MEDROL DOSEPAK) 4 MG TBPK tablet Take 24 mg on day 1, 20 mg on day 2, 16 mg on day 3, 12 mg on day 4, 8 mg on day 5, 4 mg on day 6. 21 tablet Theadora Rama Scales, PA-C   ipratropium (ATROVENT) 0.06 % nasal spray Place 2 sprays into both nostrils 4 (four) times daily. As needed for nasal congestion, runny nose 15 mL Theadora Rama Scales, PA-C      PDMP not reviewed this encounter.  Pending results:  Labs Reviewed - No data to display  Medications Ordered in UC: Medications  methylPREDNISolone sodium succinate (SOLU-MEDROL) 125 mg/2 mL injection 125 mg (has no administration in time range)    Disposition Upon Discharge:  Condition: stable for discharge home Home: take medications as prescribed; routine discharge instructions as discussed; follow up as advised.  Patient presented with an acute illness with associated systemic symptoms and significant discomfort requiring urgent management. In my opinion, this is a condition that a prudent lay person (someone who possesses an average knowledge of health and medicine) may potentially expect to result in complications if not addressed urgently such as respiratory distress, impairment of bodily function or dysfunction of bodily organs.    Routine symptom specific, illness specific and/or disease specific instructions were discussed with the patient and/or caregiver at length.   As such, the patient has been evaluated and assessed, work-up was performed and treatment was provided in alignment with urgent care protocols and evidence based medicine.  Patient/parent/caregiver has been advised that the patient may require follow up for further testing and treatment if the symptoms continue in spite of treatment, as clinically indicated and appropriate.  The patient was tested for COVID-19, Influenza and/or RSV, then the patient/parent/guardian was advised to isolate at home pending the results of his/her diagnostic coronavirus test and potentially longer if they're positive. I have also advised pt that if his/her COVID-19 test returns positive, it's recommended to self-isolate for at least 10 days after symptoms first appeared AND until fever-free for 24 hours without fever reducer AND other symptoms  have improved or resolved. Discussed self-isolation recommendations as well as instructions for household member/close contacts as per the [facility] and [place] [facility], and also gave patient the COVID packet with this information.  Patient/parent/caregiver has been advised to return to the [facility] or PCP in 3-5 days if no better; to PCP or the Emergency Department if new signs and symptoms develop, or if the current signs or symptoms continue to change or worsen for further workup, evaluation and treatment as clinically indicated and appropriate  The patient will follow up with their current PCP if and as advised. If the patient does not currently have a PCP we will assist them in obtaining one.   The patient may need specialty follow up if the symptoms continue, in spite of conservative treatment and management, for further workup, evaluation, consultation and treatment as clinically indicated and appropriate.  Patient/parent/caregiver verbalized  understanding and agreement of plan as discussed.  All questions were addressed during visit.  Please see discharge instructions below for further details of plan.  Discharge Instructions:   Discharge Instructions      For gout flare, you were provided with an injection of Solu-Medrol and a follow-up methylprednisolone Dosepak, the same steroid, and a tapering dose.  Please take 1 row of tablets daily with your breakfast starting tomorrow morning until complete.  I also provided you with renewals of your prescriptions for colchicine.  The correct way to take colchicine for gout flare is to take 2 tablets at once, wait 1 hour and then take the third tablet, not to exceed 3 tablets in a 1 hour period.    I have also provided you with a renewal of your prescription for allopurinol 100 mg by mouth at bedtime, please do not begin taking this medication until your gout flare has completely resolved, allopurinol has been shown to prolong gout flares if taken too soon before symptoms resolved.  You may find that the steroid injection and Medrol Dosepak significantly resolve your upper respiratory inflammation and rhinitis.  Admit that it does not completely resolve the runny nose and the postnasal drip causing cough.  Please also consider using Atrovent nasal spray, 2 sprays in each nostril up to 4 times daily as needed.  This medication significantly dries up mucous membranes very effectively.          Theadora Rama Scales, PA-C 11/23/21 1139

## 2021-11-30 ENCOUNTER — Other Ambulatory Visit: Payer: Self-pay | Admitting: Neurology

## 2021-12-01 ENCOUNTER — Other Ambulatory Visit: Payer: Self-pay

## 2021-12-01 ENCOUNTER — Ambulatory Visit
Admission: EM | Admit: 2021-12-01 | Discharge: 2021-12-01 | Disposition: A | Discharge status: Home / Self Care | Payer: BC Managed Care – PPO | Attending: Emergency Medicine | Admitting: Emergency Medicine

## 2021-12-01 DIAGNOSIS — M1A9XX1 Chronic gout, unspecified, with tophus (tophi): Secondary | ICD-10-CM

## 2021-12-01 DIAGNOSIS — M109 Gout, unspecified: Secondary | ICD-10-CM

## 2021-12-01 MED ORDER — METHYLPREDNISOLONE 4 MG PO TABS: ORAL_TABLET | ORAL | 0 refills | Status: DC

## 2021-12-01 MED ORDER — METHYLPREDNISOLONE SODIUM SUCC 125 MG IJ SOLR
125.0000 mg | Freq: Once | INTRAMUSCULAR | Status: AC
  Administered 2021-12-01: 125 mg via INTRAMUSCULAR

## 2021-12-01 NOTE — ED Provider Notes (Signed)
[facility]    CSN: [id number] Arrival date & time: 12/01/21  1057    HISTORY   Chief Complaint  Patient presents with   Toe Pain   HPI Thomas Burgess is a 45 y.o. male. Pt presents with c/o right foot big toe pain. Symptoms started  a week ago. Pt states his toe is red and painful to walk on.  States he had initial improvement of symptoms after steroid injection and tapering dose of methylprednisolone.  States he did not feel the colchicine helped at all.  States after he finished the steroid symptoms returned, states he feels his pain is as bad as before treatment, possibly worse.  States he is able to stand on it but it is very painful and is interfering with his ability to work.  The history is provided by the patient.  Past Medical History:  Diagnosis Date   Common migraine 09/28/2017   Dyspnea    when walking with crutches   Family history of adverse reaction to anesthesia    mother/father- nausea   Gout    Headache syndrome 09/28/2017   Right occipital   History of kidney stones    passed   Hypothyroid    Migraine    Control Emgatitin   Patient Active Problem List   Diagnosis Date Noted   S/P ORIF (open reduction internal fixation) fracture 11/05/2020   Left ankle pain 11/05/2020   Closed fracture of left ankle    Periumbilical abdominal pain 12/17/2018   Headache syndrome 09/28/2017   Common migraine 09/28/2017   Primary localized osteoarthritis of left knee 09/19/2017   Past Surgical History:  Procedure Laterality Date   FEMUR SURGERY Left    repair with repair   FEMUR SURGERY Left    hardware removal   LAPAROSCOPIC APPENDECTOMY N/A 12/18/2018   Procedure: APPENDECTOMY LAPAROSCOPIC;  Surgeon: Almond Lint, MD;  Location: [facility];  Service: General;  Laterality: N/A;   LEG SURGERY Bilateral 2006   Broke both legs   LEG SURGERY Left    Removed hardware   ORIF ANKLE FRACTURE Left 11/05/2020   Procedure: OPEN REDUCTION INTERNAL FIXATION (ORIF)  LEFT ANKLE FRACTURE;  Surgeon: Nadara Mustard, MD;  Location: [facility];  Service: Orthopedics;  Laterality: Left;   SHOULDER SURGERY Left    Frozen shoulder    Home Medications    Prior to Admission medications   Medication Sig Start Date End Date Taking? Authorizing Provider  allopurinol (ZYLOPRIM) 100 MG tablet Take 1 tablet (100 mg total) by mouth at bedtime. 11/23/21 02/21/22  Theadora Rama Scales, PA-C  Baclofen 5 MG TABS Take 5 mg by mouth as needed. Take 1 tablet at onset of headache pain, may repeat dose in 24 hours if needed 05/25/21   Glean Salvo, NP  colchicine 0.6 MG tablet Take 1.2 mg (two 0.6-mg tablets) orally at the first sign of a flare followed by 0.6 mg (1 tablet) one hour later; MAX 1.8 mg over 1 hour 11/23/21   Theadora Rama Scales, PA-C  EMGALITY 120 MG/ML SOAJ INJECT 120 MGS SUBCUTANEOUSLY EVERY 30 DAYS 11/30/21   Glean Salvo, NP  FLUoxetine (PROZAC) 20 MG tablet Take 40 mg by mouth daily.     [provider]  gabapentin (NEURONTIN) 300 MG capsule Take 1 capsule (300 mg total) by mouth 3 (three) times daily. 3 times a day when necessary neuropathy pain 01/13/21   Persons, [place], PA  gabapentin (NEURONTIN) 300 MG capsule TAKE  1 CAPSULE(300 MG) BY MOUTH THREE TIMES DAILY AS NEEDED FOR NERVE PAIN 05/22/21   Persons, [place], PA  ipratropium (ATROVENT) 0.06 % nasal spray Place 2 sprays into both nostrils 4 (four) times daily. As needed for nasal congestion, runny nose 11/23/21   Theadora Rama Scales, PA-C  levothyroxine (SYNTHROID, LEVOTHROID) 112 MCG tablet Take 112 mcg by mouth See admin instructions. Takes 112 mcg every day, except Wednesday take 224 mcg.    [provider]  naproxen (NAPROSYN) 500 MG tablet Take 500 mg by mouth 2 (two) times daily as needed for mild pain.    [provider]  Rimegepant Sulfate (NURTEC) 75 MG TBDP Take one tablet daily onset of migraine. Samples 4 tabs. NDC [address]-4 LOT [id number] exp 05-2023 (2 2 tablet  packages). 12/16/20   Glean Salvo, NP  Rimegepant Sulfate (NURTEC) 75 MG TBDP Take 75 mg by mouth as needed (Take 1 tablet at onset of headache, max is 1 tablet in 24 hours). 12/24/20   Glean Salvo, NP    Family History Family History  Problem Relation Age of Onset   Congestive Heart Failure Mother    Diabetes Mother    Kidney failure Father    Brain cancer Cousin    Social History Social History   Tobacco Use   Smoking status: Never   Smokeless tobacco: Never  Vaping Use   Vaping Use: Never used  Substance Use Topics   Alcohol use: No   Drug use: No   Allergies   Patient has no known allergies.  Review of Systems Review of Systems Pertinent findings noted in history of present illness.   Physical Exam Triage Vital Signs ED Triage Vitals  Enc Vitals Group     BP 10/16/21 0827 (!) 147/82     Pulse Rate 10/16/21 0827 72     Resp 10/16/21 0827 18     Temp 10/16/21 0827 98.3 F (36.8 C)     Temp Source 10/16/21 0827 Oral     SpO2 10/16/21 0827 98 %     Weight --      Height --      Head Circumference --      Peak Flow --      Pain Score 10/16/21 0826 5     Pain Loc --      Pain Edu? --      Excl. in GC? --   No data found.  Updated Vital Signs BP 135/90 (BP Location: Left Arm)    Pulse 93    Temp 97.8 F (36.6 C) (Oral)    Resp 18    SpO2 95%   Physical Exam  Visual Acuity Right Eye Distance:   Left Eye Distance:   Bilateral Distance:    Right Eye Near:   Left Eye Near:    Bilateral Near:     [facility] Couse / Diagnostics / Procedures:    EKG  Radiology No results found.  Procedures Procedures (including critical care time)  UC Diagnoses / Final Clinical Impressions(s)   I have reviewed the triage vital signs and the nursing notes.  Pertinent labs & imaging results that were available during my care of the patient were reviewed by me and considered in my medical decision making (see chart for details).    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  Patient will be provided a stronger and longer course of steroids, patient has been advised to follow-up with orthopedics for intra-articular injection if longer treatment is not successful.  Note provided for work.  ED Prescriptions     Medication Sig Dispense Auth. Provider   methylPREDNISolone (MEDROL) 4 MG tablet Take 12 tablets (48 mg) on day 1 with your breakfast.  Decrease daily a.m. dose by 1 tablet (4 mg) daily until complete. (11 tablets day 2, 10 tablets day 3, 9 tablets day 4, 8 tablets day 5, 7 tablets day 6, 6 tablets day 7, 5 tablets to 8, 4 tablets day 9, 3 tablets day 10, 2 tablets day 11, 1 tablet day 12). 78 tablet Theadora Rama Scales, PA-C      PDMP not reviewed this encounter.  Pending results:  Labs Reviewed - No data to display  Medications Ordered in UC: Medications  methylPREDNISolone sodium succinate (SOLU-MEDROL) 125 mg/2 mL injection 125 mg (has no administration in time range)    Disposition Upon Discharge:  Condition: stable for discharge home Home: take medications as prescribed; routine discharge instructions as discussed; follow up as advised.  Patient presented with an acute illness with associated systemic symptoms and significant discomfort requiring urgent management. In my opinion, this is a condition that a prudent lay person (someone who possesses an average knowledge of health and medicine) may potentially expect to result in complications if not addressed urgently such as respiratory distress, impairment of bodily function or dysfunction of bodily organs.   Routine symptom specific, illness specific and/or disease specific instructions were discussed with the patient and/or caregiver at length.   As such, the patient has been evaluated and assessed, work-up was performed and  treatment was provided in alignment with urgent care protocols and evidence based medicine.  Patient/parent/caregiver has been advised that the patient may require follow up for further testing and treatment if the symptoms continue in spite of treatment, as clinically indicated and appropriate.  If the patient was tested for COVID-19, Influenza and/or RSV, then the patient/parent/guardian was advised to isolate at home pending the results of his/her diagnostic coronavirus test and potentially longer if theyre positive. I have also advised pt that if his/her COVID-19 test returns positive, it's recommended to self-isolate for at least 10 days after symptoms first appeared AND until fever-free for 24 hours without fever reducer AND other symptoms have improved or resolved. Discussed self-isolation recommendations as well as instructions for household member/close contacts as per the [facility] and  [facility], and also gave patient the COVID packet with this information.  Patient/parent/caregiver has been advised to return to the [facility] or PCP in 3-5 days if no better; to PCP or the Emergency Department if new signs and symptoms develop, or if the current signs or symptoms continue to change or worsen for further workup, evaluation and treatment as clinically indicated and appropriate  The patient will follow up with their current PCP if and as advised. If the patient does not currently have a PCP we will assist them in obtaining one.   The patient may need specialty follow up if the symptoms continue, in spite of conservative treatment and management, for further workup, evaluation, consultation and treatment as clinically indicated and appropriate.   Patient/parent/caregiver verbalized understanding and agreement of plan as discussed.  All questions were addressed during visit.  Please see discharge instructions below for further details of plan.  Discharge Instructions:   Discharge Instructions      Because  you have had some improvement with your previous dose of steroids, it is recommended that you take a stronger and longer course of steroids for presumed rebound gout flare.  Guidelines also recommend that you go ahead and resume taking allopurinol as well because you initially improved but your symptoms have returned.  After completing the 12-day course of steroids which has been prescribed for you, if you have not had meaningful improvement, the neck step would be to visit your orthopedic provider to request an intra articular steroid injection.  I provided you with several notes to return to work because I do not know exactly when your symptoms will improve enough for you to do so, please feel free to choose the 1 that works best for you.  Thank you for visiting urgent care again today, I am sorry that we were not able to sufficiently treat you the first time.         Theadora Rama Scales, PA-C 12/01/21 1402

## 2021-12-01 NOTE — ED Triage Notes (Signed)
Pt presents with c/o right foot big toe pain. Symptoms started  a week ago. Pt states his toe is red and painful to walk on.

## 2021-12-01 NOTE — Discharge Instructions (Addendum)
Because you have had some improvement with your previous dose of steroids, it is recommended that you take a stronger and longer course of steroids for presumed rebound gout flare.  Guidelines also recommend that you go ahead and resume taking allopurinol as well because you initially improved but your symptoms have returned.  After completing the 12-day course of steroids which has been prescribed for you, if you have not had meaningful improvement, the neck step would be to visit your orthopedic provider to request an intra articular steroid injection.  I provided you with several notes to return to work because I do not know exactly when your symptoms will improve enough for you to do so, please feel free to choose the 1 that works best for you.  Thank you for visiting urgent care again today, I am sorry that we were not able to sufficiently treat you the first time.

## 2021-12-16 ENCOUNTER — Telehealth: Payer: Self-pay | Admitting: *Deleted

## 2021-12-16 NOTE — Telephone Encounter (Signed)
Patient has pharmacy coverage through [employer] [place] (contract [phone]).  PA for Nurtec 75mg  completed on covermymeds (key: [id number]). Decsion pending   PA for 120mg  completed on covermymeds (key: [id number]).Decision pending.

## 2021-12-17 NOTE — Telephone Encounter (Addendum)
YI#[id number] for Emgality approved through 05/26/2023.  NO#[id number] for Nurtec approved through 12/30/2022. His prescription is for #8 each month. If needed, his plan allows as much as #16 per 30 days.

## 2022-01-07 NOTE — Telephone Encounter (Signed)
Pt would like a call from the nurse to discuss getting a sample for EMGALITY 120 MG/ML SOAJ . Reason need sample discount card is not valid and cost is too high.

## 2022-01-07 NOTE — Telephone Encounter (Addendum)
Spoke to NP,  She agrees to give pt 3 samples of emgality only. Pt will pick up Monday

## 2022-01-12 MED ORDER — EMGALITY 120 MG/ML ~~LOC~~ SOAJ: SUBCUTANEOUS | 2 refills | Status: DC

## 2022-01-12 NOTE — Telephone Encounter (Signed)
Patient's wife, Julyen Nyarko, presented to the lobby to pick up the 3 emgality samples.  Gave emgality 120mg  samples to her. 2 boxes of Lot#: [id number] and exp of 10/11/2023. 1 box of lot: [id number] with exp of 07/12/2023.

## 2022-01-12 NOTE — Addendum Note (Signed)
Addended by: Geronimo Running A on: 01/12/2022 12:01 PM   Modules accepted: Orders

## 2022-04-19 ENCOUNTER — Other Ambulatory Visit: Payer: Self-pay

## 2022-04-19 ENCOUNTER — Encounter (HOSPITAL_BASED_OUTPATIENT_CLINIC_OR_DEPARTMENT_OTHER): Payer: Self-pay | Admitting: *Deleted

## 2022-04-19 ENCOUNTER — Emergency Department (HOSPITAL_BASED_OUTPATIENT_CLINIC_OR_DEPARTMENT_OTHER)
Admission: EM | Admit: 2022-04-19 | Discharge: 2022-04-19 | Disposition: A | Discharge status: Home / Self Care | Payer: BC Managed Care – PPO | Attending: Emergency Medicine | Admitting: Emergency Medicine

## 2022-04-19 DIAGNOSIS — S3992XA Unspecified injury of lower back, initial encounter: Secondary | ICD-10-CM | POA: Diagnosis present

## 2022-04-19 DIAGNOSIS — S39012A Strain of muscle, fascia and tendon of lower back, initial encounter: Secondary | ICD-10-CM | POA: Insufficient documentation

## 2022-04-19 DIAGNOSIS — Z79899 Other long term (current) drug therapy: Secondary | ICD-10-CM | POA: Insufficient documentation

## 2022-04-19 DIAGNOSIS — Z87442 Personal history of urinary calculi: Secondary | ICD-10-CM | POA: Diagnosis not present

## 2022-04-19 DIAGNOSIS — X500XXA Overexertion from strenuous movement or load, initial encounter: Secondary | ICD-10-CM | POA: Diagnosis not present

## 2022-04-19 DIAGNOSIS — E039 Hypothyroidism, unspecified: Secondary | ICD-10-CM | POA: Diagnosis not present

## 2022-04-19 MED ORDER — CYCLOBENZAPRINE HCL 5 MG PO TABS
10.0000 mg | ORAL_TABLET | Freq: Two times a day (BID) | ORAL | 0 refills | Status: DC | PRN
Start: 2022-04-19 — End: 2023-06-14

## 2022-04-19 MED ORDER — KETOROLAC TROMETHAMINE 30 MG/ML IJ SOLN
30.0000 mg | Freq: Once | INTRAMUSCULAR | Status: AC
  Administered 2022-04-19: 30 mg via INTRAMUSCULAR
  Filled 2022-04-19: qty 1

## 2022-04-19 MED ORDER — CYCLOBENZAPRINE HCL 5 MG PO TABS
5.0000 mg | ORAL_TABLET | Freq: Once | ORAL | Status: AC
  Administered 2022-04-19: 5 mg via ORAL
  Filled 2022-04-19: qty 1

## 2022-04-19 MED ORDER — NAPROXEN 500 MG PO TABS: 500.0000 mg | ORAL_TABLET | Freq: Two times a day (BID) | ORAL | 0 refills | Status: DC

## 2022-04-19 NOTE — Discharge Instructions (Signed)
You were seen today for back pain.  Take medications as prescribed.  Do not drive while taking Flexeril. ?

## 2022-04-19 NOTE — ED Provider Notes (Signed)
?[facility] ?Provider Note ? ? ?CSN: [id number] ?Arrival date & time: 04/19/22  0434 ? ?  ? ?History ? ?Chief Complaint  ?Patient presents with  ? Back Pain  ? ? ?Thomas Burgess is a 46 y.o. male. ? ?HPI ? ?  ? ?This is a 46 year old male who presents with back pain.  Patient reports that he has had lower back pain since Saturday.  At that time he was doing some moving of furniture.  He states the pain is across his entire lower back.  It does not radiate.  It is achy in nature.  He has been using a heating pad and Tylenol with minimal relief.  He did take 1 oxycodone which she states did not help.  He denies bowel or bladder difficulty.  Denies weakness, numbness, tingling of the lower extremities.  Denies urinary symptoms or fevers. ? ?Home Medications ?Prior to Admission medications   ?Medication Sig Start Date End Date Taking? Authorizing Provider  ?cyclobenzaprine (FLEXERIL) 5 MG tablet Take 2 tablets (10 mg total) by mouth 2 (two) times daily as needed for muscle spasms. 04/19/22  Yes Kailan Laws, Mayer Maskerourtney F, MD  ?naproxen (NAPROSYN) 500 MG tablet Take 1 tablet (500 mg total) by mouth 2 (two) times daily. 04/19/22  Yes Leroy Pettway, Mayer Maskerourtney F, MD  ?allopurinol (ZYLOPRIM) 100 MG tablet Take 1 tablet (100 mg total) by mouth at bedtime. 11/23/21 02/21/22  Theadora RamaMorgan, Lindsay Scales, PA-C  ?Baclofen 5 MG TABS Take 5 mg by mouth as needed. Take 1 tablet at onset of headache pain, may repeat dose in 24 hours if needed 05/25/21   Glean SalvoSlack, Sarah J, NP  ?colchicine 0.6 MG tablet Take 1.2 mg (two 0.6-mg tablets) orally at the first sign of a flare followed by 0.6 mg (1 tablet) one hour later; MAX 1.8 mg over 1 hour 11/23/21   Theadora RamaMorgan, Lindsay Scales, PA-C  ?FLUoxetine (PROZAC) 20 MG tablet Take 40 mg by mouth daily.     [provider]  ?gabapentin (NEURONTIN) 300 MG capsule Take 1 capsule (300 mg total) by mouth 3 (three) times daily. 3 times a day when necessary neuropathy pain 01/13/21   Persons, [place], [place]   ?gabapentin (NEURONTIN) 300 MG capsule TAKE 1 CAPSULE(300 MG) BY MOUTH THREE TIMES DAILY AS NEEDED FOR NERVE PAIN 05/22/21   Persons, [place], PA  ?Galcanezumab-gnlm (EMGALITY) 120 MG/ML SOAJ INJECT 120 MGS SUBCUTANEOUSLY EVERY 30 DAYS 01/12/22   Glean SalvoSlack, Sarah J, NP  ?ipratropium (ATROVENT) 0.06 % nasal spray Place 2 sprays into both nostrils 4 (four) times daily. As needed for nasal congestion, runny nose 11/23/21   Theadora RamaMorgan, Lindsay Scales, PA-C  ?levothyroxine (SYNTHROID, LEVOTHROID) 112 MCG tablet Take 112 mcg by mouth See admin instructions. Takes 112 mcg every day, except Wednesday take 224 mcg.    [provider]  ?methylPREDNISolone (MEDROL) 4 MG tablet Take 12 tablets (48 mg) on day 1 with your breakfast.  Decrease daily a.m. dose by 1 tablet (4 mg) daily until complete. (11 tablets day 2, 10 tablets day 3, 9 tablets day 4, 8 tablets day 5, 7 tablets day 6, 6 tablets day 7, 5 tablets to 8, 4 tablets day 9, 3 tablets day 10, 2 tablets day 11, 1 tablet day 12). 12/01/21   Theadora RamaMorgan, Lindsay Scales, PA-C  ?naproxen (NAPROSYN) 500 MG tablet Take 500 mg by mouth 2 (two) times daily as needed for mild pain.    [provider]  ?Rimegepant Sulfate (NURTEC) 75 MG TBDP  Take one tablet daily onset of migraine. Samples 4 tabs. NDC [address]-7 LOT [id number] exp 05-2023 (2 2 tablet packages). 12/16/20   Glean Salvo, NP  ?Rimegepant Sulfate (NURTEC) 75 MG TBDP Take 75 mg by mouth as needed (Take 1 tablet at onset of headache, max is 1 tablet in 24 hours). 12/24/20   Glean Salvo, NP  ?   ? ?Allergies    ?Patient has no known allergies.   ? ?Review of Systems   ?Review of Systems  ?Constitutional:  Negative for fever.  ?Musculoskeletal:  Positive for back pain.  ?Neurological:  Negative for weakness and numbness.  ?All other systems reviewed and are negative. ? ?Physical Exam ?Updated Vital Signs ?BP (!) 145/90 (BP Location: Right Arm)   Pulse 79   Temp 98.7 ?F (37.1 ?C) (Oral)   Resp 14   Wt 111.1 kg    SpO2 100%   BMI 32.32 kg/m?  ?Physical Exam ?Vitals and nursing note reviewed.  ?Constitutional:   ?   Appearance: He is well-developed. He is not ill-appearing.  ?HENT:  ?   Head: Normocephalic and atraumatic.  ?   Mouth/Throat:  ?   Mouth: Mucous membranes are moist.  ?Eyes:  ?   Pupils: Pupils are equal, round, and reactive to light.  ?Cardiovascular:  ?   Rate and Rhythm: Normal rate and regular rhythm.  ?   Heart sounds: Normal heart sounds. No murmur heard. ?Pulmonary:  ?   Effort: Pulmonary effort is normal. No respiratory distress.  ?   Breath sounds: Normal breath sounds. No wheezing.  ?Abdominal:  ?   Palpations: Abdomen is soft.  ?   Tenderness: There is no abdominal tenderness.  ?Musculoskeletal:  ?   Cervical back: Neck supple.  ?   Comments: No midline C-spine tenderness to palpation, step-off, deformity.  Patient with tenderness palpation over the bilateral paraspinous musculature of the lower lumbar spine, negative straight leg raise  ?Lymphadenopathy:  ?   Cervical: No cervical adenopathy.  ?Skin: ?   General: Skin is warm and dry.  ?Neurological:  ?   Mental Status: He is alert and oriented to person, place, and time.  ?   Comments: 5 out of 5 strength bilateral lower extremities, 2+ patellar reflexes bilaterally  ?Psychiatric:     ?   Mood and Affect: Mood normal.  ? ? ?ED Results / Procedures / Treatments   ?Labs ?(all labs ordered are listed, but only abnormal results are displayed) ?Labs Reviewed - No data to display ? ?EKG ?None ? ?Radiology ?No results found. ? ?Procedures ?Procedures  ? ? ?Medications Ordered in ED ?Medications  ?ketorolac (TORADOL) 30 MG/ML injection 30 mg (has no administration in time range)  ?cyclobenzaprine (FLEXERIL) tablet 5 mg (has no administration in time range)  ? ? ?ED Course/ Medical Decision Making/ A&P ?  ?                        ?Medical Decision Making ?Risk ?Prescription drug management. ? ? ?This patient presents to the ED for concern of back pain, this  involves an extensive number of treatment options, and is a complaint that carries with it a high risk of complications and morbidity.  The differential diagnosis includes lumbosacral strain, herniated disc, less likely fracture ? ?MDM:   ? ?This is a 46 year old male who presents with lower back pain.  He is nontoxic and vital signs are reassuring.  No signs or symptoms  of cauda equina.  Neurologic exam is normal.  Given location of pain and mechanism of injury, highly suspect musculoskeletal etiology.  Do not feel imaging is indicated at this time.  Patient was given Flexeril and Toradol.  Will discharge with naproxen and Flexeril. ?(Labs, imaging) ? ?Labs: ?I Ordered, and personally interpreted labs.  The pertinent results include: None ? ?Imaging Studies ordered: ?I ordered imaging studies including none ?I independently visualized and interpreted imaging. ?I agree with the radiologist interpretation ? ?Additional history obtained from chart review.  External records from outside source obtained and reviewed including prior orthopedic and neurologic evaluation ? ?Critical Interventions: ?N/A ? ?Consultations: ?I requested consultation with the NA,  and discussed lab and imaging findings as well as pertinent plan - they recommend: N/A ? ?Cardiac Monitoring: ?The patient was maintained on a cardiac monitor.  I personally viewed and interpreted the cardiac monitored which showed an underlying rhythm of: Normal sinus rhythm ? ?Reevaluation: ?After the interventions noted above, I reevaluated the patient and found that they have :improved ? ? ?Considered admission for: N/A ? ?Social Determinants of Health: ?Lives independently ? ?Disposition: Discharge ? ?Co morbidities that complicate the patient evaluation ? ?Past Medical History:  ?Diagnosis Date  ? Common migraine 09/28/2017  ? Dyspnea   ? when walking with crutches  ? Family history of adverse reaction to anesthesia   ? mother/father- nausea  ? Gout   ? Headache  syndrome 09/28/2017  ? Right occipital  ? History of kidney stones   ? passed  ? Hypothyroid   ? Migraine   ? Control Emgatitin  ?  ? ?Medicines ?Meds ordered this encounter  ?Medications  ? ketorolac (TORA

## 2022-04-19 NOTE — ED Triage Notes (Signed)
Pt has had lower back pain since Saturday.  He reports that it began after moving furniture Saturday.  This pain is non radiating and pt denies any weakness or numbness or incontinence.   ?

## 2022-05-31 ENCOUNTER — Ambulatory Visit: Payer: BC Managed Care – PPO | Admitting: Neurology

## 2022-05-31 NOTE — Progress Notes (Signed)
PATIENT: Thomas Burgess DOB: 1976-07-10  REASON FOR VISIT: follow up for headache  HISTORY FROM: patient PRIMARY NEUROLOGIST: Dr. Billey Gosling  HISTORY OF PRESENT ILLNESS: Today 06/01/22 Thomas Burgess is here today alone.  Doing excellent on Emgality, has 1-2 mild headaches a month.  He has not missed any work.  This is a huge improvement for him.  For acute headache, will take baclofen, if this is not effective we will take Nurtec.  This combination works great.  On his last sample of Emgality, he has now met his deductible, feels his insurance will cover.  Off gabapentin.  Is really pleased.  Update 05/25/21 SS: Thomas Burgess is a 46 year old male with history of headaches and occipital neuralgia. Did quite well on Emgality, went to fill beginning of year, his portion was several hundred dollars.  According to the chart, back in January, insurance did not approve it through Jan 2023, he doesn't think he tried to fill it since then.  With [employer], he reports 90% improvement in headaches.  Are always located in the right occipital area, radiating forward associated with nausea and photophobia.  Since January, reports 3-4 significant headaches, requiring him to miss work.  Prior to [employer], he was missing 2-3 days a month.  For acute, will take baclofen with good benefit.  He did not get Nurtec.  Here today for evaluation unaccompanied.  Update 11/20/2020 SS: Thomas Burgess is a 46 year old male with history of headaches and possible occipital neuralgia. Headaches are always on the right occipital area, radiate forward, associated with nausea and photophobia.  Has been on Emgality since July 2021, has had 90% improvement in headaches.  Since then, only 2-3 headaches.  Rarely misses a day of work, before was missing 2 to 3 days a month.  Headaches occur in the evening, he will take Maxalt or baclofen with benefit, but has drowsiness.  He has to be up for work at 3 AM, this is usually why he misses work, but things  are much improved.  Unfortunately, he broke his left ankle, required surgery a few weeks ago.  Is recovering on crutches.  He has been able to discontinue Topamax completely. Presents today for evaluation unaccompanied.  HISTORY 06/02/2020 SS: Thomas Burgess 46 year old male with history of headaches and possible occipital neuralgia.  He remains on Topamax, baclofen, and Maxalt.  His headaches are always in the right occipital area, radiate forward, associated with nausea, and photophobia.  He does have history of underlying migraines, unclear if these are migraines or occipital neuralgia.  On average, 2 significant headaches a month, 2-4 mild headaches.  For headache, will initially take baclofen, usually will take away, if needed will resort to Maxalt, has excellent benefit with this.  On average, he misses 2 days of work a week.  Headaches always occur in the evening.  He has to go to work early in the morning, the headache will resolve around lunchtime.  He tried an increase in Topamax 125 mg, did not see much change, noted more side effect with memory and drowsiness.  He presents today for evaluation unaccompanied.  REVIEW OF SYSTEMS: Out of a complete 14 system review of symptoms, the patient complains only of the following symptoms, and all other reviewed systems are negative.  See HPI  ALLERGIES: No Known Allergies  HOME MEDICATIONS: Outpatient Medications Prior to Visit  Medication Sig Dispense Refill   Baclofen 5 MG TABS Take 5 mg by mouth as needed. Take 1 tablet  at onset of headache pain, may repeat dose in 24 hours if needed 30 tablet 1   colchicine 0.6 MG tablet Take 1.2 mg (two 0.6-mg tablets) orally at the first sign of a flare followed by 0.6 mg (1 tablet) one hour later; MAX 1.8 mg over 1 hour 3 tablet 2   cyclobenzaprine (FLEXERIL) 5 MG tablet Take 2 tablets (10 mg total) by mouth 2 (two) times daily as needed for muscle spasms. 15 tablet 0   FLUoxetine (PROZAC) 20 MG tablet Take 40  mg by mouth daily.      gabapentin (NEURONTIN) 300 MG capsule Take 1 capsule (300 mg total) by mouth 3 (three) times daily. 3 times a day when necessary neuropathy pain 90 capsule 3   gabapentin (NEURONTIN) 300 MG capsule TAKE 1 CAPSULE(300 MG) BY MOUTH THREE TIMES DAILY AS NEEDED FOR NERVE PAIN 90 capsule 3   Galcanezumab-gnlm (EMGALITY) 120 MG/ML SOAJ INJECT 120 MGS SUBCUTANEOUSLY EVERY 30 DAYS 1 mL 2   levothyroxine (SYNTHROID, LEVOTHROID) 112 MCG tablet Take 112 mcg by mouth See admin instructions. Takes 112 mcg every day, except Wednesday take 224 mcg.     naproxen (NAPROSYN) 500 MG tablet Take 500 mg by mouth 2 (two) times daily as needed for mild pain.     naproxen (NAPROSYN) 500 MG tablet Take 1 tablet (500 mg total) by mouth 2 (two) times daily. 30 tablet 0   Rimegepant Sulfate (NURTEC) 75 MG TBDP Take one tablet daily onset of migraine. Samples 4 tabs. [place] [address]-4 LOT [id number] exp 05-2023 (2 2 tablet packages). 4 tablet 0   Rimegepant Sulfate (NURTEC) 75 MG TBDP Take 75 mg by mouth as needed (Take 1 tablet at onset of headache, max is 1 tablet in 24 hours). 8 tablet 5   allopurinol (ZYLOPRIM) 100 MG tablet Take 1 tablet (100 mg total) by mouth at bedtime. 30 tablet 2   ipratropium (ATROVENT) 0.06 % nasal spray Place 2 sprays into both nostrils 4 (four) times daily. As needed for nasal congestion, runny nose 15 mL 12   methylPREDNISolone (MEDROL) 4 MG tablet Take 12 tablets (48 mg) on day 1 with your breakfast.  Decrease daily a.m. dose by 1 tablet (4 mg) daily until complete. (11 tablets day 2, 10 tablets day 3, 9 tablets day 4, 8 tablets day 5, 7 tablets day 6, 6 tablets day 7, 5 tablets to 8, 4 tablets day 9, 3 tablets day 10, 2 tablets day 11, 1 tablet day 12). 78 tablet 0   No facility-administered medications prior to visit.    PAST MEDICAL HISTORY: Past Medical History:  Diagnosis Date   Common migraine 09/28/2017   Dyspnea    when walking with crutches   Family history of  adverse reaction to anesthesia    mother/father- nausea   Gout    Headache syndrome 09/28/2017   Right occipital   History of kidney stones    passed   Hypothyroid    Migraine    Control Emgatitin    PAST SURGICAL HISTORY: Past Surgical History:  Procedure Laterality Date   FEMUR SURGERY Left    repair with repair   FEMUR SURGERY Left    hardware removal   LAPAROSCOPIC APPENDECTOMY N/A 12/18/2018   Procedure: APPENDECTOMY LAPAROSCOPIC;  Surgeon: Stark Klein, MD;  Location: [facility];  Service: General;  Laterality: N/A;   LEG SURGERY Bilateral 2006   Broke both legs   LEG SURGERY Left    Removed hardware   ORIF  ANKLE FRACTURE Left 11/05/2020   Procedure: OPEN REDUCTION INTERNAL FIXATION (ORIF) LEFT ANKLE FRACTURE;  Surgeon: Newt Minion, MD;  Location: [facility];  Service: Orthopedics;  Laterality: Left;   SHOULDER SURGERY Left    Frozen shoulder    FAMILY HISTORY: Family History  Problem Relation Age of Onset   Congestive Heart Failure Mother    Diabetes Mother    Kidney failure Father    Brain cancer Cousin     SOCIAL HISTORY: Social History   Socioeconomic History   Marital status: Married    Spouse name: Not on file   Number of children: 1   Years of education: 12   Highest education level: Not on file  Occupational History   Occupation: [employer] developer  Tobacco Use   Smoking status: Never   Smokeless tobacco: Never  Vaping Use   Vaping Use: Never used  Substance and Sexual Activity   Alcohol use: No   Drug use: No   Sexual activity: Not Currently    Birth control/protection: None  Other Topics Concern   Not on file  Social History Narrative   Lives with wife, daughter   Caffeine use: soft drinks daily      Right handed    Social Determinants of Health   Financial Resource Strain: Not on file  Food Insecurity: Not on file  Transportation Needs: Not on file  Physical Activity: Not on file  Stress: Not on file  Social Connections: Not on file   Intimate Partner Violence: Not on file   PHYSICAL EXAM  Vitals:   06/01/22 0729  BP: 139/75  Pulse: 74  Weight: 240 lb (108.9 kg)  Height: 6' 2" (1.88 m)    Body mass index is 30.81 kg/m.  Generalized: Well developed, in no acute distress  Neurological examination  Mentation: Alert oriented to time, place, history taking. Follows all commands speech and language fluent Cranial nerve II-XII: Pupils were equal round reactive to light. Extraocular movements were full, visual Burgess were full on confrontational test. Facial sensation and strength were normal. Head turning and shoulder shrug  were normal and symmetric. Motor: Good strength all extremities, in left post op boot after ankle surgery.  Sensory: Sensory testing is intact to soft touch on all 4 extremities. No evidence of extinction is noted.  Coordination: Cerebellar testing reveals good finger-nose-finger bilaterally Gait and station: Gait is normal. Reflexes: Normal 2+  DIAGNOSTIC DATA (LABS, IMAGING, TESTING) - I reviewed patient records, labs, notes, testing and imaging myself where available.  Lab Results  Component Value Date   WBC 6.5 11/05/2020   HGB 14.5 11/05/2020   HCT 44.5 11/05/2020   MCV 93.7 11/05/2020   PLT 214 11/05/2020      Component Value Date/Time   NA 139 11/05/2020 1204   K 4.7 11/05/2020 1204   CL 100 11/05/2020 1204   CO2 23 12/26/2018 1240   GLUCOSE 95 11/05/2020 1204   BUN 20 11/05/2020 1204   CREATININE 1.40 (H) 11/05/2020 1204   CALCIUM 9.7 12/26/2018 1240   PROT 8.1 12/26/2018 1240   ALBUMIN 4.7 12/26/2018 1240   AST 34 12/26/2018 1240   ALT 94 (H) 12/26/2018 1240   ALKPHOS 138 (H) 12/26/2018 1240   BILITOT 0.6 12/26/2018 1240   GFRNONAA >60 12/26/2018 1240   GFRAA >60 12/26/2018 1240   No results found for: "CHOL", "HDL", "LDLCALC", "LDLDIRECT", "TRIG", "CHOLHDL" No results found for: "HGBA1C" No results found for: "VITAMINB12" No results found for: "TSH"  ASSESSMENT  AND PLAN 46 y.o. year old male   18.  Right occipital headache, likely migraine headache component  -Cuauhtemoc is doing excellent, has had excellent benefit with Emgality, will continue this for headache prevention, will try to get 3 month supply of Emgality  -Continue baclofen, Nurtec as needed for acute headache treatment -No longer takes Topamax or gabapentin -Follow-up in 1 year or sooner if needed  Evangeline Dakin, DNP 06/01/2022, 7:33 AM [facility] [address], [place], [place] [address] [phone]

## 2022-06-01 ENCOUNTER — Ambulatory Visit (INDEPENDENT_AMBULATORY_CARE_PROVIDER_SITE_OTHER): Payer: BC Managed Care – PPO | Admitting: Neurology

## 2022-06-01 ENCOUNTER — Encounter: Payer: Self-pay | Admitting: Neurology

## 2022-06-01 DIAGNOSIS — G43009 Migraine without aura, not intractable, without status migrainosus: Secondary | ICD-10-CM

## 2022-06-01 MED ORDER — EMGALITY 120 MG/ML ~~LOC~~ SOAJ: SUBCUTANEOUS | 3 refills | Status: DC

## 2022-06-01 MED ORDER — NURTEC 75 MG PO TBDP: 75.0000 mg | ORAL_TABLET | ORAL | 11 refills | Status: DC | PRN

## 2022-06-01 MED ORDER — BACLOFEN 5 MG PO TABS: 5.0000 mg | ORAL_TABLET | ORAL | 3 refills | Status: DC | PRN

## 2022-06-01 NOTE — Patient Instructions (Signed)
Meds ordered this encounter  Medications   Rimegepant Sulfate (NURTEC) 75 MG TBDP    Sig: Take 75 mg by mouth as needed (Take 1 tablet at onset of headache, max is 1 tablet in 24 hours).    Dispense:  8 tablet    Refill:  11   Galcanezumab-gnlm (EMGALITY) 120 MG/ML SOAJ    Sig: INJECT 120 MGS SUBCUTANEOUSLY EVERY 30 DAYS    Dispense:  3 mL    Refill:  3    Please fill as 3 month supply of Emgality    Order Specific Question:   Lot Number?    Answer:   [id number], [id number]    Comments:   2 boxes with lot # ending in F, 1 box with lot # ending in K    Order Specific Question:   Expiration Date?    Answer:   10/11/2023    Comments:   07/12/2023    Order Specific Question:   Quantity    Answer:   3    Comments:   2 boxes of Lot#: [id number] and exp of 10/11/2023. 1 box oflot: [id number] with exp of 07/12/2023.   Baclofen 5 MG TABS    Sig: Take 5 mg by mouth as needed. Take 1 tablet at onset of headache pain, may repeat dose in 24 hours if needed    Dispense:  30 tablet    Refill:  3

## 2023-01-24 ENCOUNTER — Telehealth: Payer: Self-pay | Admitting: Neurology

## 2023-01-24 ENCOUNTER — Encounter: Payer: Self-pay | Admitting: Neurology

## 2023-01-24 MED ORDER — EMGALITY 120 MG/ML ~~LOC~~ SOAJ: SUBCUTANEOUS | 3 refills | Status: DC

## 2023-01-24 NOTE — Telephone Encounter (Signed)
Pt request refill for Galcanezumab-gnlm (EMGALITY) 120 MG/ML SOAJ  at  [facility]

## 2023-01-24 NOTE — Telephone Encounter (Signed)
Rx sent per last office visit note.

## 2023-01-24 NOTE — Addendum Note (Signed)
Addended by: Cristela Felt E on: 01/24/2023 01:20 PM   Modules accepted: Orders

## 2023-01-26 ENCOUNTER — Other Ambulatory Visit: Payer: Self-pay

## 2023-01-26 MED ORDER — EMGALITY 120 MG/ML ~~LOC~~ SOAJ: SUBCUTANEOUS | 3 refills | Status: DC

## 2023-02-09 ENCOUNTER — Other Ambulatory Visit (HOSPITAL_COMMUNITY): Payer: Self-pay

## 2023-02-09 ENCOUNTER — Telehealth: Payer: Self-pay | Admitting: Neurology

## 2023-02-09 NOTE — Telephone Encounter (Signed)
I called the patient, apologized for the delay. Let him know PA has been submitted, hopefully hear something tomorrow.

## 2023-02-09 NOTE — Telephone Encounter (Signed)
Patient Advocate Encounter   Received notification that prior authorization for Emgality 120MG/ML auto-injectors (migraine) is required.   PA submitted on 02/09/2023 Key [id number] Status is pending       Lyndel Safe, [place] Patient Advocate Specialist [place] Patient Advocate Team Direct Number: [phone]  Fax: [phone]

## 2023-02-09 NOTE — Telephone Encounter (Signed)
Pt called again demanding answers to what is the hold up from getting his Emgality approved by his insurance. Please advise.

## 2023-02-09 NOTE — Telephone Encounter (Signed)
I have sent mychart message regarding this call and updating patient. Please see other phone note

## 2023-02-09 NOTE — Telephone Encounter (Signed)
Pt states he has been without Galcanezumab-gnlm (EMGALITY) 120 MG/ML SOAJ  for 2 months and it is affecting his job.  Pt states he would like a call re: status of the PA , and if Sarah, NP has any samples he can have for the Galcanezumab-gnlm (EMGALITY) 120 MG/ML SOAJ

## 2023-02-14 NOTE — Telephone Encounter (Signed)
Received notification from  [employer]  regarding a prior authorization for  Emgality . Authorization has been APPROVED from 02/09/23 to 02/10/24.    Authorization # [id number] Phone # [phone]

## 2023-02-18 ENCOUNTER — Other Ambulatory Visit: Payer: Self-pay | Admitting: Physician Assistant

## 2023-02-28 NOTE — Telephone Encounter (Signed)
Please check on PA now for Nurtec. Thank you

## 2023-02-28 NOTE — Telephone Encounter (Signed)
Patient Advocate Encounter   Received notification that prior authorization for Nurtec '75MG'$  dispersible tablets is required.   PA submitted on 02/28/2023 Key [id number] Insurance [employer] Prior Authorization Form for Prescription Drug Status is pending       Lyndel Safe, [place] Patient Advocate Specialist [place] Patient Advocate Team Direct Number: [phone]  Fax: [phone]

## 2023-03-01 NOTE — Telephone Encounter (Signed)
PA Approved

## 2023-06-07 ENCOUNTER — Other Ambulatory Visit: Payer: Self-pay | Admitting: Neurology

## 2023-06-07 DIAGNOSIS — G43009 Migraine without aura, not intractable, without status migrainosus: Secondary | ICD-10-CM

## 2023-06-07 NOTE — Telephone Encounter (Signed)
Rx refilled.

## 2023-06-14 ENCOUNTER — Ambulatory Visit (INDEPENDENT_AMBULATORY_CARE_PROVIDER_SITE_OTHER): Payer: BC Managed Care – PPO | Admitting: Neurology

## 2023-06-14 ENCOUNTER — Encounter: Payer: Self-pay | Admitting: Neurology

## 2023-06-14 DIAGNOSIS — G43009 Migraine without aura, not intractable, without status migrainosus: Secondary | ICD-10-CM | POA: Diagnosis not present

## 2023-06-14 MED ORDER — NURTEC 75 MG PO TBDP: ORAL_TABLET | ORAL | 11 refills | Status: DC

## 2023-06-14 MED ORDER — BACLOFEN 10 MG PO TABS: 10.0000 mg | ORAL_TABLET | ORAL | 3 refills | Status: AC | PRN

## 2023-06-14 MED ORDER — EMGALITY 120 MG/ML ~~LOC~~ SOAJ: SUBCUTANEOUS | 3 refills | Status: DC

## 2023-06-14 NOTE — Progress Notes (Signed)
PATIENT: Thomas Burgess DOB: 1976-02-28  REASON FOR VISIT: follow up for headache  HISTORY FROM: patient PRIMARY NEUROLOGIST: Dr. Delena Bali  HISTORY OF PRESENT ILLNESS: Today 06/14/23 Still on Emgality. Having 1-2 migraines a month. Is his baseline. Mostly are right sided, a few on the left side. He goes into work at 3 AM, mostly headache are afternoon, medicine makes him sleepy, when he has to get up so early is still drowsy from medication. Usually misses work 1 day a month. Switched pharmacy, having better time getting the Emgality. Having issues with hypothyroidism/HTN. Mentions FMLA?  06/01/2022 SS: Thomas Burgess is here today alone.  Doing excellent on Emgality, has 1-2 mild headaches a month.  He has not missed any work.  This is a huge improvement for him.  For acute headache, will take baclofen, if this is not effective we will take Nurtec.  This combination works great.  On his last sample of Emgality, he has now met his deductible, feels his insurance will cover.  Off gabapentin.  Is really pleased.  Update 05/25/21 SS: Thomas Burgess is a 47 year old male with history of headaches and occipital neuralgia. Did quite well on Emgality, went to fill beginning of year, his portion was several hundred dollars.  According to the chart, back in January, insurance did not approve it through Jan 2023, he doesn't think he tried to fill it since then.  With [employer], he reports 90% improvement in headaches.  Are always located in the right occipital area, radiating forward associated with nausea and photophobia.  Since January, reports 3-4 significant headaches, requiring him to miss work.  Prior to [employer], he was missing 2-3 days a month.  For acute, will take baclofen with good benefit.  He did not get Nurtec.  Here today for evaluation unaccompanied.  Update 11/20/2020 SS: Thomas Burgess is a 47 year old male with history of headaches and possible occipital neuralgia. Headaches are always on the right  occipital area, radiate forward, associated with nausea and photophobia.  Has been on Emgality since July 2021, has had 90% improvement in headaches.  Since then, only 2-3 headaches.  Rarely misses a day of work, before was missing 2 to 3 days a month.  Headaches occur in the evening, he will take Maxalt or baclofen with benefit, but has drowsiness.  He has to be up for work at 3 AM, this is usually why he misses work, but things are much improved.  Unfortunately, he broke his left ankle, required surgery a few weeks ago.  Is recovering on crutches.  He has been able to discontinue Topamax completely. Presents today for evaluation unaccompanied.  HISTORY 06/02/2020 SS: Thomas Burgess 47 year old male with history of headaches and possible occipital neuralgia.  He remains on Topamax, baclofen, and Maxalt.  His headaches are always in the right occipital area, radiate forward, associated with nausea, and photophobia.  He does have history of underlying migraines, unclear if these are migraines or occipital neuralgia.  On average, 2 significant headaches a month, 2-4 mild headaches.  For headache, will initially take baclofen, usually will take away, if needed will resort to Maxalt, has excellent benefit with this.  On average, he misses 2 days of work a week.  Headaches always occur in the evening.  He has to go to work early in the morning, the headache will resolve around lunchtime.  He tried an increase in Topamax 125 mg, did not see much change, noted more side effect with memory and drowsiness.  He presents today for evaluation unaccompanied.  REVIEW OF SYSTEMS: Out of a complete 14 system review of symptoms, the patient complains only of the following symptoms, and all other reviewed systems are negative.  See HPI  ALLERGIES: No Known Allergies  HOME MEDICATIONS: Outpatient Medications Prior to Visit  Medication Sig Dispense Refill   Baclofen 5 MG TABS Take 5 mg by mouth as needed. Take 1 tablet at  onset of headache pain, may repeat dose in 24 hours if needed 30 tablet 3   colchicine 0.6 MG tablet Take 1.2 mg (two 0.6-mg tablets) orally at the first sign of a flare followed by 0.6 mg (1 tablet) one hour later; MAX 1.8 mg over 1 hour 3 tablet 2   FLUoxetine (PROZAC) 20 MG tablet Take 40 mg by mouth daily.      Galcanezumab-gnlm (EMGALITY) 120 MG/ML SOAJ INJECT 120 MGS SUBCUTANEOUSLY EVERY 30 DAYS 3 mL 3   levothyroxine (SYNTHROID, LEVOTHROID) 112 MCG tablet Take 112 mcg by mouth See admin instructions. Takes 112 mcg every day, except Wednesday take 224 mcg.     naproxen (NAPROSYN) 500 MG tablet Take 500 mg by mouth 2 (two) times daily as needed for mild pain.     naproxen (NAPROSYN) 500 MG tablet Take 1 tablet (500 mg total) by mouth 2 (two) times daily. 30 tablet 0   Rimegepant Sulfate (NURTEC) 75 MG TBDP DISSOLVE 1 TABLET(75 MG) ON THE TONGUE AT ONSET OF HEADACHE AS NEEDED. MAX IS 1 TABLET IN 24 HOURS 8 tablet 0   allopurinol (ZYLOPRIM) 100 MG tablet Take 1 tablet (100 mg total) by mouth at bedtime. 30 tablet 2   cyclobenzaprine (FLEXERIL) 5 MG tablet Take 2 tablets (10 mg total) by mouth 2 (two) times daily as needed for muscle spasms. 15 tablet 0   gabapentin (NEURONTIN) 300 MG capsule Take 1 capsule (300 mg total) by mouth 3 (three) times daily. 3 times a day when necessary neuropathy pain 90 capsule 3   gabapentin (NEURONTIN) 300 MG capsule TAKE 1 CAPSULE(300 MG) BY MOUTH THREE TIMES DAILY AS NEEDED FOR NERVE PAIN 90 capsule 3   No facility-administered medications prior to visit.    PAST MEDICAL HISTORY: Past Medical History:  Diagnosis Date   Common migraine 09/28/2017   Dyspnea    when walking with crutches   Family history of adverse reaction to anesthesia    mother/father- nausea   Gout    Headache syndrome 09/28/2017   Right occipital   History of kidney stones    passed   Hypothyroid    Migraine    Control Emgatitin    PAST SURGICAL HISTORY: Past Surgical History:   Procedure Laterality Date   FEMUR SURGERY Left    repair with repair   FEMUR SURGERY Left    hardware removal   LAPAROSCOPIC APPENDECTOMY N/A 12/18/2018   Procedure: APPENDECTOMY LAPAROSCOPIC;  Surgeon: Almond Lint, MD;  Location: [facility];  Service: General;  Laterality: N/A;   LEG SURGERY Bilateral 2006   Broke both legs   LEG SURGERY Left    Removed hardware   ORIF ANKLE FRACTURE Left 11/05/2020   Procedure: OPEN REDUCTION INTERNAL FIXATION (ORIF) LEFT ANKLE FRACTURE;  Surgeon: Nadara Mustard, MD;  Location: [facility];  Service: Orthopedics;  Laterality: Left;   SHOULDER SURGERY Left    Frozen shoulder    FAMILY HISTORY: Family History  Problem Relation Age of Onset   Congestive Heart Failure Mother    Diabetes Mother  Kidney failure Father    Brain cancer Cousin     SOCIAL HISTORY: Social History   Socioeconomic History   Marital status: Married    Spouse name: Not on file   Number of children: 1   Years of education: 12   Highest education level: Not on file  Occupational History   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  Tobacco Use   Smoking status: Never   Smokeless tobacco: Never  Vaping Use   Vaping Use: Never used  Substance and Sexual Activity   Alcohol use: Yes    Alcohol/week: 1.0 standard drink of alcohol    Types: 1 Standard drinks or equivalent per week   Drug use: No   Sexual activity: Yes    Birth control/protection: None  Other Topics Concern   Not on file  Social History Narrative   Lives with wife, daughter   Caffeine use: soft drinks daily      Right handed    Social Determinants of Health   Financial Resource Strain: Not on file  Food Insecurity: Not on file  Transportation Needs: Not on file  Physical Activity: Not on file  Stress: Not on file  Social Connections: Not on file  Intimate Partner Violence: Not on file   PHYSICAL EXAM  Vitals:   06/14/23 0738  BP: (!) 153/97  Pulse: 86  Weight: 232 lb (105.2 kg)  Height: 6\' 2"  (1.88 m)    Body mass index is 29.79 kg/m.  Generalized: Well developed, in no acute distress  Neurological examination  Mentation: Alert oriented to time, place, history taking. Follows all commands speech and language fluent Cranial nerve II-XII: Pupils were equal round reactive to light. Extraocular movements were full, visual field were full on confrontational test. Facial sensation and strength were normal. Head turning and shoulder shrug  were normal and symmetric. Motor: Good strength all extremities, in left post op boot after ankle surgery.  Sensory: Sensory testing is intact to soft touch on all 4 extremities. No evidence of extinction is noted.  Coordination: Cerebellar testing reveals good finger-nose-finger bilaterally Gait and station: Gait is normal. Reflexes: Normal 2+  DIAGNOSTIC DATA (LABS, IMAGING, TESTING) - I reviewed patient records, labs, notes, testing and imaging myself where available.  Lab Results  Component Value Date   WBC 6.5 11/05/2020   HGB 14.5 11/05/2020   HCT 44.5 11/05/2020   MCV 93.7 11/05/2020   PLT 214 11/05/2020      Component Value Date/Time   NA 139 11/05/2020 1204   K 4.7 11/05/2020 1204   CL 100 11/05/2020 1204   CO2 23 12/26/2018 1240   GLUCOSE 95 11/05/2020 1204   BUN 20 11/05/2020 1204   CREATININE 1.40 (H) 11/05/2020 1204   CALCIUM 9.7 12/26/2018 1240   PROT 8.1 12/26/2018 1240   ALBUMIN 4.7 12/26/2018 1240   AST 34 12/26/2018 1240   ALT 94 (H) 12/26/2018 1240   ALKPHOS 138 (H) 12/26/2018 1240   BILITOT 0.6 12/26/2018 1240   GFRNONAA >60 12/26/2018 1240   GFRAA >60 12/26/2018 1240   No results found for: "CHOL", "HDL", "LDLCALC", "LDLDIRECT", "TRIG", "CHOLHDL" No results found for: "HGBA1C" No results found for: "VITAMINB12" No results found for: "TSH"  ASSESSMENT AND PLAN 47 y.o. year old male   1.  Right occipital headache, likely migraine headache component  -Continues to have very good migraine control with current  regimen, on average 1-2 migraines a month -Continue Emgality 120 mg monthly injection for migraine prevention -Continue Nurtec 75  mg as needed for acute headache, will increase baclofen to 10 mg as needed for acute migraine, for severe headaches may combine combination with Aleve  -I will complete FMLA if needed -Encouraged to reach out via MyChart if needed, otherwise follow-up 1 year or sooner if needed  Meds ordered this encounter  Medications   baclofen (LIORESAL) 10 MG tablet    Sig: Take 1 tablet (10 mg total) by mouth as needed for muscle spasms (take 1 tablet at onset of headache).    Dispense:  30 each    Refill:  3   Galcanezumab-gnlm (EMGALITY) 120 MG/ML SOAJ    Sig: INJECT 120 MGS SUBCUTANEOUSLY EVERY 30 DAYS    Dispense:  3 mL    Refill:  3    Please fill as 3 month supply of Emgality    Order Specific Question:   Lot Number?    Answer:   [id number], [id number]    Comments:   2 boxes with lot # ending in F, 1 box with lot # ending in K    Order Specific Question:   Expiration Date?    Answer:   10/11/2023    Comments:   07/12/2023    Order Specific Question:   Quantity    Answer:   3    Comments:   2 boxes of Lot#: [id number] and exp of 10/11/2023. 1 box oflot: [id number] with exp of 07/12/2023.   Rimegepant Sulfate (NURTEC) 75 MG TBDP    Sig: DISSOLVE 1 TABLET(75 MG) ON THE TONGUE AT ONSET OF HEADACHE AS NEEDED. MAX IS 1 TABLET IN 24 HOURS    Dispense:  8 tablet    Refill:  [address], [place], [place] 06/14/2023, 7:50 AM [facility] [address], [address] [place], [place] [address] [phone]

## 2023-06-14 NOTE — Patient Instructions (Signed)
Continue Emgality for migraine prevention  Use Nurtec as needed, may combine with Baclofen and Aleve if severe Send me a message if needed, see you in 1 year! Will complete FMLA papers if needed

## 2023-06-16 ENCOUNTER — Telehealth: Payer: Self-pay

## 2023-06-16 ENCOUNTER — Other Ambulatory Visit (HOSPITAL_COMMUNITY): Payer: Self-pay

## 2023-06-16 NOTE — Telephone Encounter (Signed)
Pharmacy Patient Advocate Encounter   Received notification from [employer] that prior authorization for Emgality 120MG /ML auto-injectors (migraine) is required/requested.   PA submitted to [facility] via CoverMyMeds Key or [facility]) confirmation # [id number]) Status is pending

## 2023-06-16 NOTE — Telephone Encounter (Signed)
Routing to sarah slack to make certain.    I do see that prozac which he currently takes is classified as beta blocker but that's the only one in that class he ever tried. Tried maxalt 10mg  and topirimate 100mg . He doesn't have any of these listed as allergy/intolerance list.

## 2023-06-16 NOTE — Telephone Encounter (Signed)
In 2021 before starting Emgality he was missing 1 to 2 days a week of work due to headache. 2 severe headaches monthly that could last several days, 2-4 mild headaches. 10-15 significant headache days monthly before Emgality.  He has had 90% improvement with Emgality.  Previously tried and failed: Topamax, baclofen, gabapentin, Maxalt, Prozac.  I called the patient, he only uses Nurtec no more than 3 days a month, may combine with Aleve no more than 3 days a month, otherwise does not use any other OTC medication to be concerned about medication overuse.   I would do it as chronic migraine, as his headache frequency who at least 15 prior to [facility]. Thanks

## 2023-06-16 NOTE — Telephone Encounter (Addendum)
   Per chart I can only find that the PT has had 1-2 possibly 4 headache days per month before starting Emgality-is this correct?  Also I could not find documentation ion the chart concerning Medication Overuse Headache evaluation.  Please advise.  Also it looks like this would be episodic migraine and not chronic migraine per the number of headache/migraine days per month-Would you agree?

## 2023-06-21 ENCOUNTER — Other Ambulatory Visit (HOSPITAL_COMMUNITY): Payer: Self-pay

## 2023-06-21 NOTE — Telephone Encounter (Signed)
   PA good thru 02/10/2024

## 2023-06-22 ENCOUNTER — Other Ambulatory Visit: Payer: Self-pay | Admitting: Neurology

## 2023-07-14 ENCOUNTER — Encounter: Payer: Self-pay | Admitting: Neurology

## 2023-07-14 ENCOUNTER — Telehealth: Payer: Self-pay | Admitting: Neurology

## 2023-07-14 DIAGNOSIS — Z0289 Encounter for other administrative examinations: Secondary | ICD-10-CM

## 2023-07-14 NOTE — Telephone Encounter (Signed)
I talked to the patient.  Getting DOT exam, which is not new.  Flagged for baclofen.  He takes baclofen as needed for acute migraine.  He does not drive when taking it.  He has been on baclofen since 2020 as needed without adverse effect.  Paperwork completed.

## 2024-03-13 ENCOUNTER — Ambulatory Visit (HOSPITAL_BASED_OUTPATIENT_CLINIC_OR_DEPARTMENT_OTHER)
Admission: RE | Admit: 2024-03-13 | Discharge: 2024-03-13 | Disposition: A | Discharge status: Home / Self Care | Source: Ambulatory Visit | Attending: Urgent Care

## 2024-03-13 ENCOUNTER — Other Ambulatory Visit (HOSPITAL_COMMUNITY): Payer: Self-pay

## 2024-03-13 ENCOUNTER — Ambulatory Visit
Admission: EM | Admit: 2024-03-13 | Discharge: 2024-03-13 | Disposition: A | Discharge status: Home / Self Care | Attending: Family Medicine | Admitting: Family Medicine

## 2024-03-13 ENCOUNTER — Telehealth: Payer: Self-pay

## 2024-03-13 DIAGNOSIS — M79672 Pain in left foot: Secondary | ICD-10-CM | POA: Insufficient documentation

## 2024-03-13 MED ORDER — NAPROXEN 500 MG PO TABS: 500.0000 mg | ORAL_TABLET | Freq: Two times a day (BID) | ORAL | 0 refills | Status: AC

## 2024-03-13 NOTE — ED Provider Notes (Signed)
 [facility]  Note:  This document was prepared using Conservation officer, historic buildings and may include unintentional dictation errors.  MRN: [id number] DOB: [birth year]  Subjective:   Thomas Burgess is a 48 y.o. male presenting for 2-day history of moderate left foot pain.  Symptoms are most prominent over the dorsal aspect of his midfoot.  No fall, trauma, bruising, swelling, warmth, erythema, wounds, fever.  Has a history of gout but reports that this is very different for the patient.  Patient works as a Estate agent.  No current facility-administered medications for this encounter.  Current Outpatient Medications:    allopurinol (ZYLOPRIM) 100 MG tablet, Take 1 tablet (100 mg total) by mouth at bedtime., Disp: 30 tablet, Rfl: 2   baclofen (LIORESAL) 10 MG tablet, Take 1 tablet (10 mg total) by mouth as needed for muscle spasms (take 1 tablet at onset of headache)., Disp: 30 each, Rfl: 3   colchicine 0.6 MG tablet, Take 1.2 mg (two 0.6-mg tablets) orally at the first sign of a flare followed by 0.6 mg (1 tablet) one hour later; MAX 1.8 mg over 1 hour, Disp: 3 tablet, Rfl: 2   FLUoxetine (PROZAC) 20 MG tablet, Take 40 mg by mouth daily. , Disp: , Rfl:    Galcanezumab-gnlm (EMGALITY) 120 MG/ML SOAJ, INJECT 120 MGS SUBCUTANEOUSLY EVERY 30 DAYS, Disp: 3 mL, Rfl: 3   levothyroxine (SYNTHROID, LEVOTHROID) 112 MCG tablet, Take 112 mcg by mouth See admin instructions. Takes 112 mcg every day, except Wednesday take 224 mcg., Disp: , Rfl:    naproxen (NAPROSYN) 500 MG tablet, Take 500 mg by mouth 2 (two) times daily as needed for mild pain., Disp: , Rfl:    naproxen (NAPROSYN) 500 MG tablet, Take 1 tablet (500 mg total) by mouth 2 (two) times daily., Disp: 30 tablet, Rfl: 0   Rimegepant Sulfate (NURTEC) 75 MG TBDP, DISSOLVE 1 TABLET(75 MG) ON THE TONGUE AT ONSET OF HEADACHE AS NEEDED. MAX IS 1 TABLET IN 24 HOURS, Disp: 8 tablet, Rfl: 11   No Known Allergies  Past  Medical History:  Diagnosis Date   Common migraine 09/28/2017   Dyspnea    when walking with crutches   Family history of adverse reaction to anesthesia    mother/father- nausea   Gout    Headache syndrome 09/28/2017   Right occipital   History of kidney stones    passed   Hypothyroid    Migraine    Control Emgatitin     Past Surgical History:  Procedure Laterality Date   FEMUR SURGERY Left    repair with repair   FEMUR SURGERY Left    hardware removal   LAPAROSCOPIC APPENDECTOMY N/A 12/18/2018   Procedure: APPENDECTOMY LAPAROSCOPIC;  Surgeon: Almond Lint, MD;  Location: [facility];  Service: General;  Laterality: N/A;   LEG SURGERY Bilateral 2006   Broke both legs   LEG SURGERY Left    Removed hardware   ORIF ANKLE FRACTURE Left 11/05/2020   Procedure: OPEN REDUCTION INTERNAL FIXATION (ORIF) LEFT ANKLE FRACTURE;  Surgeon: Nadara Mustard, MD;  Location: [facility];  Service: Orthopedics;  Laterality: Left;   SHOULDER SURGERY Left    Frozen shoulder    Family History  Problem Relation Age of Onset   Congestive Heart Failure Mother    Diabetes Mother    Kidney failure Father    Brain cancer Cousin     Social History   Tobacco Use   Smoking status: Never  Smokeless tobacco: Never  Vaping Use   Vaping status: Never Used  Substance Use Topics   Alcohol use: Yes    Alcohol/week: 1.0 standard drink of alcohol    Types: 1 Standard drinks or equivalent per week   Drug use: No    ROS   Objective:   Vitals: BP (!) 149/101 (BP Location: Left Arm)   Pulse 96   Temp 98.2 F (36.8 C) (Oral)   Resp 18   SpO2 97%   Physical Exam Constitutional:      General: He is not in acute distress.    Appearance: Normal appearance. He is well-developed and normal weight. He is not ill-appearing, toxic-appearing or diaphoretic.  HENT:     Head: Normocephalic and atraumatic.     Right Ear: External ear normal.     Left Ear: External ear normal.     Nose: Nose normal.      Mouth/Throat:     Pharynx: Oropharynx is clear.  Eyes:     General: No scleral icterus.       Right eye: No discharge.        Left eye: No discharge.     Extraocular Movements: Extraocular movements intact.  Cardiovascular:     Rate and Rhythm: Normal rate.  Pulmonary:     Effort: Pulmonary effort is normal.  Musculoskeletal:     Cervical back: Normal range of motion.       Feet:  Neurological:     Mental Status: He is alert and oriented to person, place, and time.  Psychiatric:        Mood and Affect: Mood normal.        Behavior: Behavior normal.        Thought Content: Thought content normal.        Judgment: Judgment normal.     Applied a postop shoe to the left foot.  Assessment and Plan :   PDMP not reviewed this encounter.  1. Left foot pain    Suspect inflammatory pain, overuse pain, extensor tendinitis likely due to the nature of his work.  Recommended conservative management, ice water baths, postop shoe, naproxen for pain and inflammation.  Will pursue outpatient imaging.  Counseled patient on potential for adverse effects with medications prescribed/recommended today, ER and return-to-clinic precautions discussed, patient verbalized understanding.    Wallis Bamberg, [place] 03/13/24 1331

## 2024-03-13 NOTE — Discharge Instructions (Addendum)
 I have placed orders to have an x-ray done at the med center in [facility].  Please had there now.  Go through the main hospital and not the emergency room.  Once you are there and let them know that you will came to our clinic and we send she to their facility for an outpatient x-ray.  If no one is at the front desk then they are likely out the rest of the day and at that point you would have to go through the emergency room.  Do not check in as a patient through the emergency room.  Simply let them know that you are there for an outpatient x-ray from our clinic.  I will call you with your results and update our treatment plan if necessary after I get the report.   In the meantime, use naproxen for pain and inflammation. Do ice water baths for the left foot for 10 minutes every 2 hours.

## 2024-03-13 NOTE — ED Triage Notes (Addendum)
 Left foot pain started a couple days ago. No unknown injuries or trauma.

## 2024-03-13 NOTE — Telephone Encounter (Signed)
 Pharmacy Patient Advocate Encounter   Received notification from Fax that prior authorization for Emgality 120MG /ML auto-injectors (migraine) is required/requested.   Insurance verification completed.   The patient is insured through [facility]  .   Per test claim: PA required; PA submitted to above mentioned insurance via CoverMyMeds Key/confirmation #/EOC [facility] Status is pending

## 2024-03-19 ENCOUNTER — Other Ambulatory Visit (HOSPITAL_COMMUNITY): Payer: Self-pay

## 2024-03-19 NOTE — Telephone Encounter (Signed)
 Pharmacy Patient Advocate Encounter  Received notification from [facility]  that Prior Authorization for Emgality 120MG /ML auto-injectors (migraine)has been APPROVED from 03/13/2024 to 03/13/2025. Ran test claim, Copay is $35.00. This test claim was processed through [facility]- copay amounts may vary at other pharmacies due to pharmacy/plan contracts, or as the patient moves through the different stages of their insurance plan.   PA #/Case ID/Reference #: [id number]

## 2024-03-21 ENCOUNTER — Other Ambulatory Visit (HOSPITAL_COMMUNITY): Payer: Self-pay

## 2024-03-21 ENCOUNTER — Encounter: Payer: Self-pay | Admitting: Neurology

## 2024-03-21 ENCOUNTER — Telehealth: Payer: Self-pay

## 2024-03-21 MED ORDER — EMGALITY 120 MG/ML ~~LOC~~ SOAJ: SUBCUTANEOUS | 3 refills | Status: DC

## 2024-03-21 MED ORDER — EMGALITY 120 MG/ML ~~LOC~~ SOAJ: SUBCUTANEOUS | 11 refills | Status: DC

## 2024-03-21 NOTE — Telephone Encounter (Signed)
 Emgality pa needed. Routing to the pa team to get started workig on this urgently

## 2024-03-21 NOTE — Addendum Note (Signed)
 Addended by: Danne Harbor on: 03/21/2024 02:38 PM   Modules accepted: Orders

## 2024-03-21 NOTE — Telephone Encounter (Signed)
 There is a previous approved PA on file-Insurance will not allow PT to get a 90DS-can only fill for 52ml/30DS.

## 2024-03-21 NOTE — Addendum Note (Signed)
 Addended by: Danne Harbor on: 03/21/2024 02:56 PM   Modules accepted: Orders

## 2024-06-03 ENCOUNTER — Ambulatory Visit: Admission: EM | Admit: 2024-06-03 | Discharge: 2024-06-03 | Disposition: A | Discharge status: Home / Self Care

## 2024-06-03 ENCOUNTER — Encounter: Payer: Self-pay | Admitting: Emergency Medicine

## 2024-06-03 DIAGNOSIS — M25561 Pain in right knee: Secondary | ICD-10-CM | POA: Diagnosis not present

## 2024-06-03 MED ORDER — PREDNISONE 20 MG PO TABS: 40.0000 mg | ORAL_TABLET | Freq: Every day | ORAL | 0 refills | Status: AC

## 2024-06-03 NOTE — ED Triage Notes (Signed)
 Pt presents with right knee pain and swelling x 1 week. Pt says his range of motion is limited and it has been difficult to walk. Pt has tried OTC meds to help with sxs but has had no relief. Pt says he has gout and initially thought that is what was causing the pain.

## 2024-06-03 NOTE — ED Provider Notes (Signed)
 [facility]    CSN: [id number] Arrival date & time: 06/03/24  1137      History   Chief Complaint Chief Complaint  Patient presents with   Knee Pain    HPI Thomas Burgess is a 48 y.o. male.   Patient here today for evaluation of right knee pain that started about a week ago.  He has had some associated swelling as well.  He does have a history of gout.  He notes that this presentation is somewhat different because there is no redness and is not necessarily warm to touch.  He does state that he has not had any injury to the area.  He has tried over-the-counter medication without improvement.  Movement and weightbearing worsens pain.  The history is provided by the patient.  Knee Pain Associated symptoms: no fever     Past Medical History:  Diagnosis Date   Common migraine 09/28/2017   Dyspnea    when walking with crutches   Family history of adverse reaction to anesthesia    mother/father- nausea   Gout    Headache syndrome 09/28/2017   Right occipital   History of kidney stones    passed   Hypothyroid    Migraine    Control Emgatitin    Patient Active Problem List   Diagnosis Date Noted   S/P ORIF (open reduction internal fixation) fracture 11/05/2020   Left ankle pain 11/05/2020   Closed fracture of left ankle    Periumbilical abdominal pain 12/17/2018   Gout 07/29/2018   Hypothyroidism 07/29/2018   Headache syndrome 09/28/2017   Migraine 09/28/2017   Primary localized osteoarthritis of left knee 09/19/2017    Past Surgical History:  Procedure Laterality Date   FEMUR SURGERY Left    repair with repair   FEMUR SURGERY Left    hardware removal   LAPAROSCOPIC APPENDECTOMY N/A 12/18/2018   Procedure: APPENDECTOMY LAPAROSCOPIC;  Surgeon: Lockie Rima, MD;  Location: [facility];  Service: General;  Laterality: N/A;   LEG SURGERY Bilateral 2006   Broke both legs   LEG SURGERY Left    Removed hardware   ORIF ANKLE FRACTURE Left 11/05/2020    Procedure: OPEN REDUCTION INTERNAL FIXATION (ORIF) LEFT ANKLE FRACTURE;  Surgeon: Timothy Ford, MD;  Location: [facility];  Service: Orthopedics;  Laterality: Left;   SHOULDER SURGERY Left    Frozen shoulder       Home Medications    Prior to Admission medications   Medication Sig Start Date End Date Taking? Authorizing Provider  acetaminophen  (TYLENOL ) 500 MG tablet Take 500 mg by mouth every 6 (six) hours as needed.   Yes [provider]  levothyroxine  (SYNTHROID ) 300 MCG tablet Take 300 mcg by mouth every morning. 02/08/24  Yes [provider]  predniSONE  (DELTASONE ) 20 MG tablet Take 2 tablets (40 mg total) by mouth daily with breakfast for 5 days. 06/03/24 06/08/24 Yes Vernestine Gondola, PA-C  allopurinol  (ZYLOPRIM ) 100 MG tablet Take 1 tablet (100 mg total) by mouth at bedtime. 11/23/21 02/21/22  Eloise Hake Scales, PA-C  baclofen  (LIORESAL ) 10 MG tablet Take 1 tablet (10 mg total) by mouth as needed for muscle spasms (take 1 tablet at onset of headache). 06/14/23   Wess Hammed, NP  colchicine  0.6 MG tablet Take 1.2 mg (two 0.6-mg tablets) orally at the first sign of a flare followed by 0.6 mg (1 tablet) one hour later; MAX 1.8 mg over 1 hour 11/23/21   Eloise Hake  Scales, PA-C  FLUoxetine  (PROZAC ) 20 MG tablet Take 40 mg by mouth daily.     [provider]  Galcanezumab -gnlm (EMGALITY ) 120 MG/ML SOAJ INJECT 120 MGS SUBCUTANEOUSLY EVERY 30 DAYS 03/21/24   Wess Hammed, NP  levothyroxine  (SYNTHROID , LEVOTHROID) 112 MCG tablet Take 112 mcg by mouth See admin instructions. Takes 112 mcg every day, except Wednesday take 224 mcg.    [provider]  naproxen  (NAPROSYN ) 500 MG tablet Take 1 tablet (500 mg total) by mouth 2 (two) times daily with a meal. 03/13/24   Adolph Hoop, PA-C  Rimegepant Sulfate (NURTEC) 75 MG TBDP DISSOLVE 1 TABLET(75 MG) ON THE TONGUE AT ONSET OF HEADACHE AS NEEDED. MAX IS 1 TABLET IN 24 HOURS 06/14/23   Wess Hammed, NP    Family  History Family History  Problem Relation Age of Onset   Congestive Heart Failure Mother    Diabetes Mother    Kidney failure Father    Brain cancer Cousin     Social History Social History   Tobacco Use   Smoking status: Never    Passive exposure: Never   Smokeless tobacco: Never  Vaping Use   Vaping status: Never Used  Substance Use Topics   Alcohol use: Yes    Alcohol/week: 1.0 standard drink of alcohol    Types: 1 Standard drinks or equivalent per week   Drug use: No     Allergies   Patient has no known allergies.   Review of Systems Review of Systems  Constitutional:  Negative for chills and fever.  Eyes:  Negative for discharge and redness.  Respiratory:  Negative for shortness of breath.   Musculoskeletal:  Positive for arthralgias and joint swelling.  Neurological:  Negative for numbness.     Physical Exam Triage Vital Signs ED Triage Vitals  Encounter Vitals Group     BP      Girls Systolic BP Percentile      Girls Diastolic BP Percentile      Boys Systolic BP Percentile      Boys Diastolic BP Percentile      Pulse      Resp      Temp      Temp src      SpO2      Weight      Height      Head Circumference      Peak Flow      Pain Score      Pain Loc      Pain Education      Exclude from Growth Chart    No data found.  Updated Vital Signs BP (!) 162/96 (BP Location: Left Arm)   Pulse 69   Temp 97.8 F (36.6 C) (Oral)   Resp 18   Wt 231 lb 14.8 oz (105.2 kg)   SpO2 97%   BMI 29.78 kg/m   Visual Acuity Right Eye Distance:   Left Eye Distance:   Bilateral Distance:    Right Eye Near:   Left Eye Near:    Bilateral Near:     Physical Exam Vitals and nursing note reviewed.  Constitutional:      General: He is not in acute distress.    Appearance: Normal appearance. He is not ill-appearing.  HENT:     Head: Normocephalic and atraumatic.   Eyes:     Conjunctiva/sclera: Conjunctivae normal.    Cardiovascular:     Rate and  Rhythm: Normal rate.  Pulmonary:  Effort: Pulmonary effort is normal. No respiratory distress.   Musculoskeletal:     Comments: Mildly decreased range of motion of right knee.  Mild swelling appreciated without erythema to anterior knee.  Tenderness appreciated to diffuse anterior knee with no tenderness noted to posterior knee or calf.   Neurological:     Mental Status: He is alert.   Psychiatric:        Mood and Affect: Mood normal.        Behavior: Behavior normal.        Thought Content: Thought content normal.      UC Treatments / Results  Labs (all labs ordered are listed, but only abnormal results are displayed) Labs Reviewed - No data to display  EKG   Radiology No results found.  Procedures Procedures (including critical care time)  Medications Ordered in UC Medications - No data to display  Initial Impression / Assessment and Plan / UC Course  I have reviewed the triage vital signs and the nursing notes.  Pertinent labs & imaging results that were available during my care of the patient were reviewed by me and considered in my medical decision making (see chart for details).    Suspect inflammatory process of some sort given lack of injury.  Will treat with steroid burst and advised follow-up if no gradual improvement with any worsening symptoms.  Final Clinical Impressions(s) / UC Diagnoses   Final diagnoses:  Acute pain of right knee     Discharge Instructions       Please follow up with ortho if no improvement or with any worsening.      ED Prescriptions     Medication Sig Dispense Auth. Provider   predniSONE  (DELTASONE ) 20 MG tablet Take 2 tablets (40 mg total) by mouth daily with breakfast for 5 days. 10 tablet Vernestine Gondola, PA-C      PDMP not reviewed this encounter.   Vernestine Gondola, PA-C 06/03/24 1432

## 2024-06-03 NOTE — Discharge Instructions (Signed)
  Please follow up with ortho if no improvement or with any worsening.

## 2024-06-18 NOTE — Progress Notes (Unsigned)
 PATIENT: Thomas Burgess DOB: [birth year]  REASON FOR VISIT: follow up for headache  HISTORY FROM: patient PRIMARY NEUROLOGIST: Dr. Chima/Dr. Margaret   HISTORY OF PRESENT ILLNESS: Today 06/19/24 Doing well, about 1 migraine a month, may go a month without. Remains on Emgality . Uses Nurtec as needed, does make him sleepy, will go to sleep. He goes into work 3 AM. We do his FMLA. Insurance covers his medication, uses discount card. He has been able to stop Prozac . 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   06/14/23 SS: Still on Emgality . Having 1-2 migraines a month. Is his baseline. Mostly are right sided, a few on the left side. He goes into work at 3 AM, mostly headache are afternoon, medicine makes him sleepy, when he has to get up so early is still drowsy from medication. Usually misses work 1 day a month. Switched pharmacy, having better time getting the Emgality . Having issues with hypothyroidism/HTN. Mentions FMLA?  06/01/2022 SS: Thomas Burgess is here today alone.  Doing excellent on Emgality , has 1-2 mild headaches a month.  He has not missed any work.  This is a huge improvement for him.  For acute headache, will take baclofen , if this is not effective we will take Nurtec.  This combination works great.  On his last sample of Emgality , he has now met his deductible, feels his insurance will cover.  Off gabapentin .  Is really pleased.  Update 05/25/21 SS: Thomas Burgess is a 48 year old male with history of headaches and occipital neuralgia. Did quite well on Emgality , went to fill beginning of year, his portion was several hundred dollars.  According to the chart, back in January, insurance did not approve it through Jan 2023, he doesn't think he tried to fill it since then.  With Emgality , he reports 90% improvement in headaches.  Are always located in the right occipital area, radiating forward associated with nausea and photophobia.  Since January, reports 3-4 significant headaches, requiring him  to miss work.  Prior to Emgality , he was missing 2-3 days a month.  For acute, will take baclofen  with good benefit.  He did not get Nurtec.  Here today for evaluation unaccompanied.  Update 11/20/2020 SS: Thomas Burgess is a 48 year old male with history of headaches and possible occipital neuralgia. Headaches are always on the right occipital area, radiate forward, associated with nausea and photophobia.  Has been on Emgality  since July 2021, has had 90% improvement in headaches.  Since then, only 2-3 headaches.  Rarely misses a day of work, before was missing 2 to 3 days a month.  Headaches occur in the evening, he will take Maxalt  or baclofen  with benefit, but has drowsiness.  He has to be up for work at 3 AM, this is usually why he misses work, but things are much improved.  Unfortunately, he broke his left ankle, required surgery a few weeks ago.  Is recovering on crutches.  He has been able to discontinue Topamax  completely. Presents today for evaluation unaccompanied.  HISTORY 06/02/2020 SS: Thomas Burgess 48 year old male with history of headaches and possible occipital neuralgia.  He remains on Topamax , baclofen , and Maxalt .  His headaches are always in the right occipital area, radiate forward, associated with nausea, and photophobia.  He does have history of underlying migraines, unclear if these are migraines or occipital neuralgia.  On average, 2 significant headaches a month, 2-4 mild headaches.  For headache, will initially take baclofen , usually will take away, if needed will resort  to Maxalt , has excellent benefit with this.  On average, he misses 2 days of work a week.  Headaches always occur in the evening.  He has to go to work early in the morning, the headache will resolve around lunchtime.  He tried an increase in Topamax  125 mg, did not see much change, noted more side effect with memory and drowsiness.  He presents today for evaluation unaccompanied.  REVIEW OF SYSTEMS: Out of a complete 14  system review of symptoms, the patient complains only of the following symptoms, and all other reviewed systems are negative.  See HPI  ALLERGIES: No Known Allergies  HOME MEDICATIONS: Outpatient Medications Prior to Visit  Medication Sig Dispense Refill   acetaminophen  (TYLENOL ) 500 MG tablet Take 500 mg by mouth every 6 (six) hours as needed.     allopurinol  (ZYLOPRIM ) 100 MG tablet Take 1 tablet (100 mg total) by mouth at bedtime. 30 tablet 2   baclofen  (LIORESAL ) 10 MG tablet Take 1 tablet (10 mg total) by mouth as needed for muscle spasms (take 1 tablet at onset of headache). 30 each 3   colchicine  0.6 MG tablet Take 1.2 mg (two 0.6-mg tablets) orally at the first sign of a flare followed by 0.6 mg (1 tablet) one hour later; MAX 1.8 mg over 1 hour 3 tablet 2   Galcanezumab -gnlm (EMGALITY ) 120 MG/ML SOAJ INJECT 120 MGS SUBCUTANEOUSLY EVERY 30 DAYS 1 mL 11   levothyroxine  (SYNTHROID ) 300 MCG tablet Take 300 mcg by mouth every morning.     levothyroxine  (SYNTHROID , LEVOTHROID) 112 MCG tablet Take 112 mcg by mouth See admin instructions. Takes 112 mcg every day, except Wednesday take 224 mcg.     naproxen  (NAPROSYN ) 500 MG tablet Take 1 tablet (500 mg total) by mouth 2 (two) times daily with a meal. 30 tablet 0   Rimegepant Sulfate (NURTEC) 75 MG TBDP DISSOLVE 1 TABLET(75 MG) ON THE TONGUE AT ONSET OF HEADACHE AS NEEDED. MAX IS 1 TABLET IN 24 HOURS 8 tablet 11   FLUoxetine  (PROZAC ) 20 MG tablet Take 40 mg by mouth daily.      No facility-administered medications prior to visit.    PAST MEDICAL HISTORY: Past Medical History:  Diagnosis Date   Common migraine 09/28/2017   Dyspnea    when walking with crutches   Family history of adverse reaction to anesthesia    mother/father- nausea   Gout    Headache syndrome 09/28/2017   Right occipital   History of kidney stones    passed   Hypothyroid    Migraine    Control Emgatitin    PAST SURGICAL HISTORY: Past Surgical History:   Procedure Laterality Date   FEMUR SURGERY Left    repair with repair   FEMUR SURGERY Left    hardware removal   LAPAROSCOPIC APPENDECTOMY N/A 12/18/2018   Procedure: APPENDECTOMY LAPAROSCOPIC;  Surgeon: Aron Shoulders, MD;  Location: [facility];  Service: General;  Laterality: N/A;   LEG SURGERY Bilateral 2006   Broke both legs   LEG SURGERY Left    Removed hardware   ORIF ANKLE FRACTURE Left 11/05/2020   Procedure: OPEN REDUCTION INTERNAL FIXATION (ORIF) LEFT ANKLE FRACTURE;  Surgeon: Harden Jerona GAILS, MD;  Location: [facility];  Service: Orthopedics;  Laterality: Left;   SHOULDER SURGERY Left    Frozen shoulder    FAMILY HISTORY: Family History  Problem Relation Age of Onset   Congestive Heart Failure Mother    Diabetes Mother    Kidney  failure Father    Brain cancer Cousin     SOCIAL HISTORY: Social History   Socioeconomic History   Marital status: Married    Spouse name: Not on file   Number of children: 1   Years of education: 12   Highest education level: Not on file  Occupational History   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  Tobacco Use   Smoking status: Never    Passive exposure: Never   Smokeless tobacco: Never  Vaping Use   Vaping status: Never Used  Substance and Sexual Activity   Alcohol use: Yes    Alcohol/week: 1.0 standard drink of alcohol    Types: 1 Standard drinks or equivalent per week   Drug use: No   Sexual activity: Yes    Birth control/protection: None  Other Topics Concern   Not on file  Social History Narrative   Lives with wife, daughter   Caffeine use: soft drinks daily      Right handed    Social Drivers of Health   Financial Resource Strain: Not on file  Food Insecurity: Not on file  Transportation Needs: Not on file  Physical Activity: Not on file  Stress: Not on file  Social Connections: Not on file  Intimate Partner Violence: Not on file   PHYSICAL EXAM  Vitals:   06/19/24 0732 06/19/24 0738  BP: (!) 146/90 (!) 145/88  Pulse: (!) 51    Weight: 239 lb (108.4 kg)   Height: 6' 2 (1.88 m)    Body mass index is 30.69 kg/m.  Generalized: Well developed, in no acute distress  Neurological examination  Mentation: Alert oriented to time, place, history taking. Follows all commands speech and language fluent Cranial nerve II-XII: Pupils were equal round reactive to light. Extraocular movements were full, visual field were full on confrontational test. Facial sensation and strength were normal. Head turning and shoulder shrug  were normal and symmetric. Motor: Good strength all extremities, in left post op boot after ankle surgery.  Sensory: Sensory testing is intact to soft touch on all 4 extremities. No evidence of extinction is noted.  Coordination: Cerebellar testing reveals good finger-nose-finger bilaterally Gait and station: Gait is normal. Reflexes: Normal 2+  DIAGNOSTIC DATA (LABS, IMAGING, TESTING) - I reviewed patient records, labs, notes, testing and imaging myself where available.  Lab Results  Component Value Date   WBC 6.5 11/05/2020   HGB 14.5 11/05/2020   HCT 44.5 11/05/2020   MCV 93.7 11/05/2020   PLT 214 11/05/2020      Component Value Date/Time   NA 139 11/05/2020 1204   K 4.7 11/05/2020 1204   CL 100 11/05/2020 1204   CO2 23 12/26/2018 1240   GLUCOSE 95 11/05/2020 1204   BUN 20 11/05/2020 1204   CREATININE 1.40 (H) 11/05/2020 1204   CALCIUM 9.7 12/26/2018 1240   PROT 8.1 12/26/2018 1240   ALBUMIN 4.7 12/26/2018 1240   AST 34 12/26/2018 1240   ALT 94 (H) 12/26/2018 1240   ALKPHOS 138 (H) 12/26/2018 1240   BILITOT 0.6 12/26/2018 1240   GFRNONAA >60 12/26/2018 1240   GFRAA >60 12/26/2018 1240   No results found for: CHOL, HDL, LDLCALC, LDLDIRECT, TRIG, CHOLHDL No results found for: YHAJ8R No results found for: VITAMINB12 No results found for: TSH  ASSESSMENT AND PLAN 48 y.o. year old male   1.  Right occipital headache, likely migraine headache component  -Overall  doing great, about 1 migraine a month -Continue Emgality  120 mg monthly injection for  migraine prevention -Continue Nurtec 75 mg as needed for acute headache, can continue baclofen  as needed for severe acute migraine -We complete his FMLA -Follow-up in 1 year or sooner if needed, he prefers in office visits  Lauraine Born, AGNP-C, DNP 06/19/2024, 7:48 AM [facility] [address] [place], [place] [address] [phone]

## 2024-06-19 ENCOUNTER — Ambulatory Visit (INDEPENDENT_AMBULATORY_CARE_PROVIDER_SITE_OTHER): Payer: BC Managed Care – PPO | Admitting: Neurology

## 2024-06-19 ENCOUNTER — Encounter: Payer: Self-pay | Admitting: Neurology

## 2024-06-19 DIAGNOSIS — G43009 Migraine without aura, not intractable, without status migrainosus: Secondary | ICD-10-CM | POA: Diagnosis not present

## 2024-06-19 MED ORDER — NURTEC 75 MG PO TBDP: ORAL_TABLET | ORAL | 11 refills | Status: AC

## 2024-06-19 NOTE — Patient Instructions (Signed)
 Great to see you today! We will continue current medications. Please call for an increase in headache. Follow-up in 1 year.  Thanks!!

## 2024-07-12 ENCOUNTER — Other Ambulatory Visit (HOSPITAL_COMMUNITY): Payer: Self-pay

## 2024-07-17 ENCOUNTER — Encounter (HOSPITAL_BASED_OUTPATIENT_CLINIC_OR_DEPARTMENT_OTHER): Payer: Self-pay

## 2024-07-17 ENCOUNTER — Emergency Department (HOSPITAL_BASED_OUTPATIENT_CLINIC_OR_DEPARTMENT_OTHER)
Admission: EM | Admit: 2024-07-17 | Discharge: 2024-07-17 | Disposition: A | Discharge status: Home / Self Care | Attending: Emergency Medicine | Admitting: Emergency Medicine

## 2024-07-17 ENCOUNTER — Emergency Department (HOSPITAL_BASED_OUTPATIENT_CLINIC_OR_DEPARTMENT_OTHER)

## 2024-07-17 ENCOUNTER — Other Ambulatory Visit: Payer: Self-pay

## 2024-07-17 DIAGNOSIS — R1031 Right lower quadrant pain: Secondary | ICD-10-CM | POA: Diagnosis present

## 2024-07-17 DIAGNOSIS — R109 Unspecified abdominal pain: Secondary | ICD-10-CM

## 2024-07-17 LAB — URINALYSIS, ROUTINE W REFLEX MICROSCOPIC
Bilirubin Urine: NEGATIVE
Glucose, UA: NEGATIVE mg/dL
Hgb urine dipstick: NEGATIVE
Ketones, ur: NEGATIVE mg/dL
Leukocytes,Ua: NEGATIVE
Nitrite: NEGATIVE
Protein, ur: NEGATIVE mg/dL
Specific Gravity, Urine: 1.034 — ABNORMAL HIGH (ref 1.005–1.030)
pH: 5.5 (ref 5.0–8.0)

## 2024-07-17 LAB — COMPREHENSIVE METABOLIC PANEL WITH GFR
ALT: 34 U/L (ref 0–44)
AST: 24 U/L (ref 15–41)
Albumin: 4.6 g/dL (ref 3.5–5.0)
Alkaline Phosphatase: 90 U/L (ref 38–126)
Anion gap: 11 (ref 5–15)
BUN: 12 mg/dL (ref 6–20)
CO2: 26 mmol/L (ref 22–32)
Calcium: 9.9 mg/dL (ref 8.9–10.3)
Chloride: 103 mmol/L (ref 98–111)
Creatinine, Ser: 1.32 mg/dL — ABNORMAL HIGH (ref 0.61–1.24)
GFR, Estimated: >60 mL/min (ref >60)
Glucose, Bld: 104 mg/dL — ABNORMAL HIGH (ref 70–99)
Potassium: 3.8 mmol/L (ref 3.5–5.1)
Sodium: 140 mmol/L (ref 135–145)
Total Bilirubin: 0.9 mg/dL (ref 0.0–1.2)
Total Protein: 7.9 g/dL (ref 6.5–8.1)

## 2024-07-17 LAB — CBC
HCT: 42.9 % (ref 39.0–52.0)
Hemoglobin: 14.4 g/dL (ref 13.0–17.0)
MCH: 30.2 pg (ref 26.0–34.0)
MCHC: 33.6 g/dL (ref 30.0–36.0)
MCV: 89.9 fL (ref 80.0–100.0)
Platelets: 218 K/uL (ref 150–400)
RBC: 4.77 MIL/uL (ref 4.22–5.81)
RDW: 13.6 % (ref 11.5–15.5)
WBC: 8.5 K/uL (ref 4.0–10.5)
nRBC: 0 % (ref 0.0–0.2)

## 2024-07-17 LAB — LIPASE, BLOOD: Lipase: 28 U/L (ref 11–51)

## 2024-07-17 MED ORDER — ONDANSETRON HCL 4 MG/2ML IJ SOLN: 4.0000 mg | Freq: Three times a day (TID) | INTRAMUSCULAR | Status: DC | PRN

## 2024-07-17 MED ORDER — IOHEXOL 300 MG/ML  SOLN
100.0000 mL | Freq: Once | INTRAMUSCULAR | Status: AC | PRN
  Administered 2024-07-17: 100 mL via INTRAVENOUS

## 2024-07-17 MED ORDER — ONDANSETRON HCL 4 MG/2ML IJ SOLN
4.0000 mg | Freq: Once | INTRAMUSCULAR | Status: AC
  Administered 2024-07-17: 4 mg via INTRAVENOUS
  Filled 2024-07-17: qty 2

## 2024-07-17 MED ORDER — FENTANYL CITRATE PF 50 MCG/ML IJ SOSY
50.0000 ug | PREFILLED_SYRINGE | Freq: Once | INTRAMUSCULAR | Status: AC
  Administered 2024-07-17: 50 ug via INTRAVENOUS
  Filled 2024-07-17: qty 1

## 2024-07-17 MED ORDER — SODIUM CHLORIDE 0.9 % IV BOLUS
1000.0000 mL | Freq: Once | INTRAVENOUS | Status: AC
  Administered 2024-07-17: 1000 mL via INTRAVENOUS

## 2024-07-17 NOTE — ED Notes (Addendum)
 Pt discharged home and given discharge paperwork. Opportunities given for questions. Pt verbalizes understanding. PIV removed x1. Jillyn Hidden , RN

## 2024-07-17 NOTE — ED Provider Notes (Signed)
 [place] EMERGENCY DEPARTMENT AT [facility] Provider Note   CSN: [id number] Arrival date & time: 07/17/24  9389     Patient presents with: Abdominal Pain   Thomas Burgess is a 48 y.o. male.    Abdominal Pain    48 year old male presenting to the emergency department with stabbing right lower quadrant pain that comes and goes.  The patient states that the pain has been ongoing for the past 2 hours.  He states that he had an appendectomy 3 years ago.  He denies any fevers or chills.  Denies any dysuria, frequency or hematuria.  He endorses some pain in the right flank.  He endorses nausea, denies any vomiting.  He is moving his bowels and had a last bowel movement yesterday that was normal.  He is passing gas.  Prior to Admission medications   Medication Sig Start Date End Date Taking? Authorizing Provider  acetaminophen  (TYLENOL ) 500 MG tablet Take 500 mg by mouth every 6 (six) hours as needed.    [provider]  allopurinol  (ZYLOPRIM ) 100 MG tablet Take 1 tablet (100 mg total) by mouth at bedtime. 11/23/21 06/19/25  Joesph Shaver Scales, PA-C  baclofen  (LIORESAL ) 10 MG tablet Take 1 tablet (10 mg total) by mouth as needed for muscle spasms (take 1 tablet at onset of headache). 06/14/23   Gayland Lauraine PARAS, NP  colchicine  0.6 MG tablet Take 1.2 mg (two 0.6-mg tablets) orally at the first sign of a flare followed by 0.6 mg (1 tablet) one hour later; MAX 1.8 mg over 1 hour 11/23/21   Joesph Shaver Scales, PA-C  Galcanezumab -gnlm (EMGALITY ) 120 MG/ML SOAJ INJECT 120 MGS SUBCUTANEOUSLY EVERY 30 DAYS 03/21/24   Gayland Lauraine PARAS, NP  levothyroxine  (SYNTHROID ) 300 MCG tablet Take 300 mcg by mouth every morning. 02/08/24   [provider]  levothyroxine  (SYNTHROID , LEVOTHROID) 112 MCG tablet Take 112 mcg by mouth See admin instructions. Takes 112 mcg every day, except Wednesday take 224 mcg.    [provider]  naproxen  (NAPROSYN ) 500 MG tablet Take 1 tablet (500 mg  total) by mouth 2 (two) times daily with a meal. 03/13/24   Christopher Savannah, PA-C  Rimegepant Sulfate (NURTEC) 75 MG TBDP DISSOLVE 1 TABLET(75 MG) ON THE TONGUE AT ONSET OF HEADACHE AS NEEDED. MAX IS 1 TABLET IN 24 HOURS 06/19/24   Gayland Lauraine PARAS, NP    Allergies: Patient has no known allergies.    Review of Systems  Gastrointestinal:  Positive for abdominal pain.  Genitourinary:  Positive for flank pain.  All other systems reviewed and are negative.   Updated Vital Signs BP (!) 152/88   Pulse 79   Temp 98 F (36.7 C) (Oral)   Resp 18   SpO2 100%   Physical Exam Vitals and nursing note reviewed.  Constitutional:      General: He is not in acute distress.    Appearance: He is well-developed.  HENT:     Head: Normocephalic and atraumatic.  Eyes:     Conjunctiva/sclera: Conjunctivae normal.  Cardiovascular:     Rate and Rhythm: Normal rate and regular rhythm.     Heart sounds: No murmur heard. Pulmonary:     Effort: Pulmonary effort is normal. No respiratory distress.     Breath sounds: Normal breath sounds.  Abdominal:     Palpations: Abdomen is soft.     Tenderness: There is abdominal tenderness in the right lower quadrant. There is no right CVA tenderness, left CVA  tenderness or guarding.  Musculoskeletal:        General: No swelling.     Cervical back: Neck supple.  Skin:    General: Skin is warm and dry.     Capillary Refill: Capillary refill takes less than 2 seconds.  Neurological:     Mental Status: He is alert.  Psychiatric:        Mood and Affect: Mood normal.     (all labs ordered are listed, but only abnormal results are displayed) Labs Reviewed  CBC  LIPASE, BLOOD  COMPREHENSIVE METABOLIC PANEL WITH GFR  URINALYSIS, ROUTINE W REFLEX MICROSCOPIC    EKG: None  Radiology: No results found.   Procedures   Medications Ordered in the ED  fentaNYL  (SUBLIMAZE ) injection 50 mcg (50 mcg Intravenous Given 07/17/24 0636)  sodium chloride  0.9 % bolus 1,000  mL (1,000 mLs Intravenous New Bag/Given 07/17/24 9366)  ondansetron  (ZOFRAN ) injection 4 mg (4 mg Intravenous Given 07/17/24 0636)                                    Medical Decision Making Amount and/or Complexity of Data Reviewed Labs: ordered. Radiology: ordered.  Risk Prescription drug management.     48 year old male presenting to the emergency department with stabbing right lower quadrant pain that comes and goes.  The patient states that the pain has been ongoing for the past 2 hours.  He states that he had an appendectomy 3 years ago.  He denies any fevers or chills.  Denies any dysuria, frequency or hematuria.  He endorses some pain in the right flank.  He endorses nausea, denies any vomiting.  He is moving his bowels and had a last bowel movement yesterday that was normal.  He is passing gas.  On arrival, the patient was afebrile, vitally stable.  Physical exam revealed right lower quadrant tenderness to palpation, no rebound or guarding.  Differential diagnose includes diverticulitis, nephrolithiasis, pyelonephritis.  Less likely other acute intra-abdominal abnormality.  IV access was obtained the patient was administered IV fentanyl , IV Zofran  and IV fluid bolus.  Labs: CBC without a leukocytosis or anemia, remaining laboratory evaluation pending at time of signout.  CT imaging was also ordered and pending at time of signout.  Plan to follow-up results of diagnostic testing, reassess the patient, signout given to Dr. Mannie at 0700     Final diagnoses:  None    ED Discharge Orders     None          Jerrol Agent, MD 07/17/24 [phone]

## 2024-07-17 NOTE — ED Provider Notes (Addendum)
  Physical Exam  BP 132/87   Pulse 67   Temp 98 F (36.7 C) (Oral)   Resp 18   SpO2 100%   Physical Exam  Procedures  Procedures  ED Course / MDM    Medical Decision Making Amount and/or Complexity of Data Reviewed Labs: ordered. Radiology: ordered.  Risk Prescription drug management.   Thomas Burgess, assumed care for this patient.  In brief 48 year old male here today for intermittent right lower quadrant pain.  Patient CT imaging negative aside from some punctate stones in the patient's right kidney.  Likely had nephrolithiasis which has since passed.  No signs of infection in the urine.  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  Patient does not drink any alcohol, does not take Tylenol .  Only health issues are migraines for which she sees neurology, and hypothyroidism, takes levothyroxine .  No known history of familial liver disease.  Discussed with patient, he is going to follow-up with his PCP for liver ultrasound.  Will discharge.       Burgess Fairy T, DO 07/17/24 [id number]    Burgess Fairy T, DO 07/17/24 [phone]

## 2024-07-17 NOTE — ED Triage Notes (Signed)
 Pt presents via POV c/o RLQ abd pain x2hr PTA. Reports previous hx of appendectomy. Reports nausea.

## 2024-07-17 NOTE — Discharge Instructions (Addendum)
 While you were in the emergency room, you had blood work done that was normal.  Your CT scan showed that you do have some small kidney stones in your kidneys.  You likely passed one of the small stones.  Drink plenty of water, refer to additional paperwork for food choices that can help reduce your likelihood of developing kidney stones.  Follow-up with your primary care doctor.  Your CT imaging also showed some concerns about injury to your liver.  Please follow-up with your primary care doctor to get an ultrasound of your liver.

## 2024-12-06 ENCOUNTER — Telehealth: Payer: Self-pay | Admitting: Neurology

## 2024-12-06 MED ORDER — EMGALITY 120 MG/ML ~~LOC~~ SOAJ: 120.0000 mg | SUBCUTANEOUS | 11 refills | Status: AC

## 2024-12-06 NOTE — Telephone Encounter (Signed)
 CALLED AND SPOKE TO PT AND STATED HOW TO FIND LETTER. PT VOICED UNDERSTANDING

## 2024-12-06 NOTE — Addendum Note (Signed)
 Addended by: GAYLAND LAURAINE PARAS on: 12/06/2024 12:53 PM   Modules accepted: Orders

## 2024-12-06 NOTE — Telephone Encounter (Signed)
 I called the patient. We are no longer offering nerve blocks, he was fine with this. Advised to use Nurtec.  He missed work yesterday and today.  He is feeling much better.  I will also change his [facility]  prescription to every 28 days versus every 30.  His FMLA has expired, requesting a work note for yesterday and today which I will write.

## 2024-12-06 NOTE — Telephone Encounter (Signed)
 I called the patient. He reports a bad HA lasting 2 days, missed work yesterday and today. Nurtec provides some decrease in pain but no complete relief. Patient said at past appt he had d/w Lauraine that if he had a HA that lasted 2 days he could call for possible injection. For about he past 5 months he usually gets one migraine/HA consistently around the time he is due for his injection. He had one HA this month already and then now has a 2 day HA. He denies any new or concerning symptoms associated with the headache.

## 2024-12-06 NOTE — Telephone Encounter (Signed)
 Pt states he was told to call if he were to have headaches for 2 days consecutively , he said he was told to call about an injection or Botox, please call pt to discuss.

## 2024-12-17 ENCOUNTER — Other Ambulatory Visit (HOSPITAL_COMMUNITY): Payer: Self-pay

## 2024-12-17 ENCOUNTER — Telehealth: Payer: Self-pay

## 2024-12-17 NOTE — Telephone Encounter (Signed)
 Pharmacy Patient Advocate Encounter   Received notification from CoverMyMeds that prior authorization for Nurtec is required/requested.   Insurance verification completed.   The patient is insured through [employer] [place] .   Per test claim: PA required; PA submitted to above mentioned insurance via Latent Key/confirmation #/EOC [id number] Status is pending

## 2024-12-18 ENCOUNTER — Other Ambulatory Visit (HOSPITAL_COMMUNITY): Payer: Self-pay

## 2024-12-18 NOTE — Telephone Encounter (Signed)
 Pharmacy Patient Advocate Encounter  Received notification from [employer] [place]  that Prior Authorization for Nurtec has been APPROVED from 12/18/2024 to 12/17/2025. Ran test claim, Copay is $0. This test claim was processed through [facility]- copay amounts may vary at other pharmacies due to pharmacy/plan contracts, or as the patient moves through the different stages of their insurance plan.   PA #/Case ID/Reference #: [id number]

## 2025-06-25 ENCOUNTER — Ambulatory Visit: Admitting: Neurology
